# Patient Record
Sex: Female | Born: 1935
Health system: Southern US, Community
[De-identification: ages and names within clinical notes are randomized; demographics above are authoritative.]

## PROBLEM LIST (undated history)

## (undated) DIAGNOSIS — M549 Dorsalgia, unspecified: Secondary | ICD-10-CM

## (undated) DIAGNOSIS — K259 Gastric ulcer, unspecified as acute or chronic, without hemorrhage or perforation: Secondary | ICD-10-CM

## (undated) DIAGNOSIS — M1990 Unspecified osteoarthritis, unspecified site: Secondary | ICD-10-CM

## (undated) DIAGNOSIS — I639 Cerebral infarction, unspecified: Secondary | ICD-10-CM

## (undated) DIAGNOSIS — I10 Essential (primary) hypertension: Secondary | ICD-10-CM

## (undated) DIAGNOSIS — K317 Polyp of stomach and duodenum: Secondary | ICD-10-CM

## (undated) DIAGNOSIS — K219 Gastro-esophageal reflux disease without esophagitis: Secondary | ICD-10-CM

## (undated) HISTORY — DX: Unspecified osteoarthritis, unspecified site: M19.90

## (undated) HISTORY — DX: Gastro-esophageal reflux disease without esophagitis: K21.9

## (undated) HISTORY — PX: COLONOSCOPY: SHX174

## (undated) HISTORY — DX: Gastric ulcer, unspecified as acute or chronic, without hemorrhage or perforation: K25.9

## (undated) HISTORY — DX: Polyp of stomach and duodenum: K31.7

## (undated) HISTORY — DX: Essential (primary) hypertension: I10

---

## 2004-03-25 LAB — HM DEXA SCAN: HM DEXA SCAN: NORMAL

## 2005-03-29 ENCOUNTER — Ambulatory Visit: Payer: Self-pay | Admitting: Family Medicine

## 2005-09-11 HISTORY — PX: CARDIAC CATHETERIZATION: SHX172

## 2005-09-11 HISTORY — PX: CARPAL TUNNEL RELEASE: SHX101

## 2006-01-03 ENCOUNTER — Ambulatory Visit: Payer: Self-pay | Admitting: Family Medicine

## 2006-02-21 ENCOUNTER — Ambulatory Visit: Payer: Self-pay | Admitting: Specialist

## 2006-02-21 ENCOUNTER — Other Ambulatory Visit: Payer: Self-pay

## 2006-02-28 ENCOUNTER — Ambulatory Visit: Payer: Self-pay | Admitting: Specialist

## 2006-03-26 ENCOUNTER — Ambulatory Visit: Payer: Self-pay | Admitting: Family Medicine

## 2006-04-24 ENCOUNTER — Ambulatory Visit: Payer: Self-pay | Admitting: Family Medicine

## 2006-05-02 ENCOUNTER — Ambulatory Visit: Payer: Self-pay | Admitting: Family Medicine

## 2007-06-13 ENCOUNTER — Ambulatory Visit: Payer: Self-pay | Admitting: Family Medicine

## 2007-10-31 ENCOUNTER — Encounter: Payer: Self-pay | Admitting: Rheumatology

## 2007-11-10 ENCOUNTER — Encounter: Payer: Self-pay | Admitting: Rheumatology

## 2007-12-11 ENCOUNTER — Encounter: Payer: Self-pay | Admitting: Rheumatology

## 2008-01-13 ENCOUNTER — Ambulatory Visit: Payer: Self-pay | Admitting: Rheumatology

## 2008-01-22 ENCOUNTER — Inpatient Hospital Stay: Payer: Self-pay | Admitting: Internal Medicine

## 2008-01-29 ENCOUNTER — Other Ambulatory Visit: Payer: Self-pay

## 2008-01-29 ENCOUNTER — Emergency Department: Payer: Self-pay | Admitting: Emergency Medicine

## 2008-03-23 ENCOUNTER — Ambulatory Visit: Payer: Self-pay | Admitting: Pain Medicine

## 2008-04-07 ENCOUNTER — Ambulatory Visit: Payer: Self-pay | Admitting: Pain Medicine

## 2008-04-21 ENCOUNTER — Ambulatory Visit: Payer: Self-pay | Admitting: Physician Assistant

## 2008-08-20 ENCOUNTER — Ambulatory Visit: Payer: Self-pay | Admitting: Family Medicine

## 2008-09-11 HISTORY — PX: VASCULAR SURGERY: SHX849

## 2008-11-03 ENCOUNTER — Ambulatory Visit: Payer: Self-pay | Admitting: Vascular Surgery

## 2009-09-08 ENCOUNTER — Ambulatory Visit: Payer: Self-pay | Admitting: Family Medicine

## 2009-09-11 HISTORY — PX: LAMINECTOMY: SHX219

## 2009-09-11 HISTORY — PX: UPPER GI ENDOSCOPY: SHX6162

## 2009-09-11 HISTORY — PX: BACK SURGERY: SHX140

## 2010-02-06 ENCOUNTER — Emergency Department: Payer: Self-pay | Admitting: Emergency Medicine

## 2010-02-11 ENCOUNTER — Ambulatory Visit: Payer: Self-pay | Admitting: Orthopedic Surgery

## 2010-02-12 ENCOUNTER — Emergency Department: Payer: Self-pay | Admitting: Emergency Medicine

## 2010-04-27 ENCOUNTER — Ambulatory Visit: Payer: Self-pay | Admitting: Gastroenterology

## 2010-04-28 LAB — PATHOLOGY REPORT

## 2010-08-11 ENCOUNTER — Ambulatory Visit: Payer: Self-pay | Admitting: Gastroenterology

## 2010-08-25 ENCOUNTER — Encounter: Payer: Self-pay | Admitting: Neurosurgery

## 2010-09-11 ENCOUNTER — Encounter: Payer: Self-pay | Admitting: Neurosurgery

## 2010-10-12 ENCOUNTER — Encounter: Payer: Self-pay | Admitting: Neurosurgery

## 2010-11-22 ENCOUNTER — Ambulatory Visit: Payer: Self-pay | Admitting: Family Medicine

## 2011-06-23 ENCOUNTER — Telehealth: Payer: Self-pay

## 2011-06-23 DIAGNOSIS — K862 Cyst of pancreas: Secondary | ICD-10-CM

## 2011-06-23 NOTE — Telephone Encounter (Signed)
Pt has been instructed and meds reviewed.  Instructions have also been mailed

## 2011-06-23 NOTE — Telephone Encounter (Signed)
Need to instruct and review meds for EUS

## 2011-07-13 ENCOUNTER — Ambulatory Visit: Payer: Self-pay

## 2011-07-13 ENCOUNTER — Encounter: Payer: Self-pay | Admitting: Gastroenterology

## 2011-07-18 ENCOUNTER — Encounter: Payer: Self-pay | Admitting: Gastroenterology

## 2011-09-12 HISTORY — PX: OTHER SURGICAL HISTORY: SHX169

## 2011-09-14 DIAGNOSIS — H2510 Age-related nuclear cataract, unspecified eye: Secondary | ICD-10-CM | POA: Diagnosis not present

## 2011-10-09 DIAGNOSIS — H2510 Age-related nuclear cataract, unspecified eye: Secondary | ICD-10-CM | POA: Diagnosis not present

## 2011-10-17 ENCOUNTER — Ambulatory Visit: Payer: Self-pay | Admitting: Ophthalmology

## 2011-10-17 DIAGNOSIS — Z01812 Encounter for preprocedural laboratory examination: Secondary | ICD-10-CM | POA: Diagnosis not present

## 2011-10-17 DIAGNOSIS — I119 Hypertensive heart disease without heart failure: Secondary | ICD-10-CM | POA: Diagnosis not present

## 2011-10-17 DIAGNOSIS — Z01810 Encounter for preprocedural cardiovascular examination: Secondary | ICD-10-CM | POA: Diagnosis not present

## 2011-10-17 DIAGNOSIS — H2510 Age-related nuclear cataract, unspecified eye: Secondary | ICD-10-CM | POA: Diagnosis not present

## 2011-10-17 LAB — POTASSIUM: Potassium: 3.6 mmol/L (ref 3.5–5.1)

## 2011-10-25 ENCOUNTER — Ambulatory Visit: Payer: Self-pay | Admitting: Ophthalmology

## 2011-10-25 DIAGNOSIS — H2510 Age-related nuclear cataract, unspecified eye: Secondary | ICD-10-CM | POA: Diagnosis not present

## 2011-10-25 DIAGNOSIS — Z79899 Other long term (current) drug therapy: Secondary | ICD-10-CM | POA: Diagnosis not present

## 2011-10-25 DIAGNOSIS — H269 Unspecified cataract: Secondary | ICD-10-CM | POA: Diagnosis not present

## 2011-10-25 DIAGNOSIS — M810 Age-related osteoporosis without current pathological fracture: Secondary | ICD-10-CM | POA: Diagnosis not present

## 2011-10-25 DIAGNOSIS — M4800 Spinal stenosis, site unspecified: Secondary | ICD-10-CM | POA: Diagnosis not present

## 2011-10-25 DIAGNOSIS — R609 Edema, unspecified: Secondary | ICD-10-CM | POA: Diagnosis not present

## 2011-10-25 DIAGNOSIS — I10 Essential (primary) hypertension: Secondary | ICD-10-CM | POA: Diagnosis not present

## 2011-11-14 ENCOUNTER — Ambulatory Visit: Payer: Self-pay | Admitting: Ophthalmology

## 2011-11-14 DIAGNOSIS — Z9889 Other specified postprocedural states: Secondary | ICD-10-CM | POA: Diagnosis not present

## 2011-11-14 DIAGNOSIS — Z01812 Encounter for preprocedural laboratory examination: Secondary | ICD-10-CM | POA: Diagnosis not present

## 2011-11-14 DIAGNOSIS — H2510 Age-related nuclear cataract, unspecified eye: Secondary | ICD-10-CM | POA: Diagnosis not present

## 2011-11-14 DIAGNOSIS — I252 Old myocardial infarction: Secondary | ICD-10-CM | POA: Diagnosis not present

## 2011-11-14 DIAGNOSIS — I10 Essential (primary) hypertension: Secondary | ICD-10-CM | POA: Diagnosis not present

## 2011-11-14 LAB — POTASSIUM: Potassium: 3.7 mmol/L (ref 3.5–5.1)

## 2011-11-22 ENCOUNTER — Ambulatory Visit: Payer: Self-pay | Admitting: Ophthalmology

## 2011-11-22 DIAGNOSIS — I252 Old myocardial infarction: Secondary | ICD-10-CM | POA: Diagnosis not present

## 2011-11-22 DIAGNOSIS — Z7982 Long term (current) use of aspirin: Secondary | ICD-10-CM | POA: Diagnosis not present

## 2011-11-22 DIAGNOSIS — Z79899 Other long term (current) drug therapy: Secondary | ICD-10-CM | POA: Diagnosis not present

## 2011-11-22 DIAGNOSIS — Z9109 Other allergy status, other than to drugs and biological substances: Secondary | ICD-10-CM | POA: Diagnosis not present

## 2011-11-22 DIAGNOSIS — M129 Arthropathy, unspecified: Secondary | ICD-10-CM | POA: Diagnosis not present

## 2011-11-22 DIAGNOSIS — I10 Essential (primary) hypertension: Secondary | ICD-10-CM | POA: Diagnosis not present

## 2011-11-22 DIAGNOSIS — M4800 Spinal stenosis, site unspecified: Secondary | ICD-10-CM | POA: Diagnosis not present

## 2011-11-22 DIAGNOSIS — R002 Palpitations: Secondary | ICD-10-CM | POA: Diagnosis not present

## 2011-11-22 DIAGNOSIS — K219 Gastro-esophageal reflux disease without esophagitis: Secondary | ICD-10-CM | POA: Diagnosis not present

## 2011-11-22 DIAGNOSIS — M810 Age-related osteoporosis without current pathological fracture: Secondary | ICD-10-CM | POA: Diagnosis not present

## 2011-11-22 DIAGNOSIS — H269 Unspecified cataract: Secondary | ICD-10-CM | POA: Diagnosis not present

## 2011-11-22 DIAGNOSIS — R609 Edema, unspecified: Secondary | ICD-10-CM | POA: Diagnosis not present

## 2011-11-22 DIAGNOSIS — H2510 Age-related nuclear cataract, unspecified eye: Secondary | ICD-10-CM | POA: Diagnosis not present

## 2012-01-16 DIAGNOSIS — I10 Essential (primary) hypertension: Secondary | ICD-10-CM | POA: Diagnosis not present

## 2012-01-16 DIAGNOSIS — Z79899 Other long term (current) drug therapy: Secondary | ICD-10-CM | POA: Diagnosis not present

## 2012-01-16 DIAGNOSIS — R5383 Other fatigue: Secondary | ICD-10-CM | POA: Diagnosis not present

## 2012-01-16 DIAGNOSIS — M549 Dorsalgia, unspecified: Secondary | ICD-10-CM | POA: Diagnosis not present

## 2012-01-24 ENCOUNTER — Ambulatory Visit: Payer: Self-pay | Admitting: Family Medicine

## 2012-01-24 DIAGNOSIS — Z1231 Encounter for screening mammogram for malignant neoplasm of breast: Secondary | ICD-10-CM | POA: Diagnosis not present

## 2012-05-02 DIAGNOSIS — H903 Sensorineural hearing loss, bilateral: Secondary | ICD-10-CM | POA: Diagnosis not present

## 2012-05-02 DIAGNOSIS — H6120 Impacted cerumen, unspecified ear: Secondary | ICD-10-CM | POA: Diagnosis not present

## 2012-07-01 DIAGNOSIS — Z961 Presence of intraocular lens: Secondary | ICD-10-CM | POA: Diagnosis not present

## 2012-07-01 DIAGNOSIS — H2510 Age-related nuclear cataract, unspecified eye: Secondary | ICD-10-CM | POA: Diagnosis not present

## 2012-07-17 DIAGNOSIS — Z0000 Encounter for general adult medical examination without abnormal findings: Secondary | ICD-10-CM | POA: Diagnosis not present

## 2012-07-17 DIAGNOSIS — R5381 Other malaise: Secondary | ICD-10-CM | POA: Diagnosis not present

## 2012-07-17 DIAGNOSIS — M549 Dorsalgia, unspecified: Secondary | ICD-10-CM | POA: Diagnosis not present

## 2012-08-14 DIAGNOSIS — I10 Essential (primary) hypertension: Secondary | ICD-10-CM | POA: Diagnosis not present

## 2012-08-14 DIAGNOSIS — J309 Allergic rhinitis, unspecified: Secondary | ICD-10-CM | POA: Diagnosis not present

## 2012-08-14 DIAGNOSIS — Z23 Encounter for immunization: Secondary | ICD-10-CM | POA: Diagnosis not present

## 2012-08-14 DIAGNOSIS — R109 Unspecified abdominal pain: Secondary | ICD-10-CM | POA: Diagnosis not present

## 2012-09-11 DIAGNOSIS — K317 Polyp of stomach and duodenum: Secondary | ICD-10-CM

## 2012-09-11 HISTORY — DX: Polyp of stomach and duodenum: K31.7

## 2012-09-19 DIAGNOSIS — I10 Essential (primary) hypertension: Secondary | ICD-10-CM | POA: Diagnosis not present

## 2012-09-19 DIAGNOSIS — R109 Unspecified abdominal pain: Secondary | ICD-10-CM | POA: Diagnosis not present

## 2012-09-25 ENCOUNTER — Ambulatory Visit: Payer: Self-pay | Admitting: Family Medicine

## 2012-09-25 DIAGNOSIS — R109 Unspecified abdominal pain: Secondary | ICD-10-CM | POA: Diagnosis not present

## 2012-09-25 DIAGNOSIS — N269 Renal sclerosis, unspecified: Secondary | ICD-10-CM | POA: Diagnosis not present

## 2012-10-10 DIAGNOSIS — E669 Obesity, unspecified: Secondary | ICD-10-CM | POA: Diagnosis not present

## 2012-10-10 DIAGNOSIS — I10 Essential (primary) hypertension: Secondary | ICD-10-CM | POA: Diagnosis not present

## 2012-10-10 DIAGNOSIS — R109 Unspecified abdominal pain: Secondary | ICD-10-CM | POA: Diagnosis not present

## 2012-10-21 ENCOUNTER — Ambulatory Visit: Payer: Self-pay | Admitting: Family Medicine

## 2012-10-21 DIAGNOSIS — K828 Other specified diseases of gallbladder: Secondary | ICD-10-CM | POA: Diagnosis not present

## 2012-10-21 DIAGNOSIS — R948 Abnormal results of function studies of other organs and systems: Secondary | ICD-10-CM | POA: Diagnosis not present

## 2012-10-21 DIAGNOSIS — R109 Unspecified abdominal pain: Secondary | ICD-10-CM | POA: Diagnosis not present

## 2012-11-19 ENCOUNTER — Encounter: Payer: Self-pay | Admitting: *Deleted

## 2012-11-21 ENCOUNTER — Ambulatory Visit: Payer: Self-pay | Admitting: General Surgery

## 2012-11-21 DIAGNOSIS — R1013 Epigastric pain: Secondary | ICD-10-CM | POA: Diagnosis not present

## 2012-11-26 ENCOUNTER — Encounter: Payer: Self-pay | Admitting: *Deleted

## 2012-11-27 ENCOUNTER — Encounter: Payer: Self-pay | Admitting: *Deleted

## 2012-11-27 ENCOUNTER — Encounter: Payer: Self-pay | Admitting: General Surgery

## 2012-11-27 ENCOUNTER — Ambulatory Visit (INDEPENDENT_AMBULATORY_CARE_PROVIDER_SITE_OTHER): Payer: Medicare Other | Admitting: General Surgery

## 2012-11-27 VITALS — BP 132/64 | HR 76 | Resp 14 | Ht 63.5 in | Wt 193.0 lb

## 2012-11-27 DIAGNOSIS — R1013 Epigastric pain: Secondary | ICD-10-CM | POA: Diagnosis not present

## 2012-11-27 NOTE — Progress Notes (Signed)
Subjective:     Patient ID: Dawn [name], female   DOB: 08/05/1934, 79 y.o.   MRN: [id number]  HPI Patient presents for a follow up of her HIDA Scan results. Patient was previously seen for abdominal pain that started in November 2013. The patient states she experienced pain every time she would eat. She continues to have the same pain with minimal improvement with use of Zantac and Prevacid.    Review of Systems  Constitutional: Negative.   Respiratory: Negative.   Cardiovascular: Negative.   Gastrointestinal: Negative.        Objective:   Physical Exam  Eyes: Conjunctivae are normal. No scleral icterus.  Cardiovascular: Normal rate, regular rhythm and normal heart sounds.   Pulmonary/Chest: Effort normal and breath sounds normal.  Abdominal: Soft. Normal appearance and bowel sounds are normal.       Assessment:    Pt has had normal [place] of gb. She ha low EF on last HIDA but repeat HIDA is normal.  Plan for Upper endoscopy      Plan:        Schedule upper endoscopy. If normal, will get CT abdomen. Pt advised

## 2012-11-27 NOTE — Patient Instructions (Signed)
Esophagogastroduodenoscopy This is an endoscopic procedure (a procedure that uses a device like a flexible telescope) that allows your caregiver to view the upper stomach and small bowel. This test allows your caregiver to look at the esophagus. The esophagus carries food from your mouth to your stomach. They can also look at your duodenum. This is the first part of the small intestine that attaches to the stomach. This test is used to detect problems in the bowel such as ulcers and inflammation. PREPARATION FOR TEST Nothing to eat after midnight the day before the test. NORMAL FINDINGS Normal esophagus, stomach, and duodenum. Ranges for normal findings may vary among different laboratories and hospitals. You should always check with your doctor after having lab work or other tests done to discuss the meaning of your test results and whether your values are considered within normal limits. MEANING OF TEST  Your caregiver will go over the test results with you and discuss the importance and meaning of your results, as well as treatment options and the need for additional tests if necessary. OBTAINING THE TEST RESULTS It is your responsibility to obtain your test results. Ask the lab or department performing the test when and how you will get your results. Document Released: 12/29/2004 Document Revised: 11/20/2011 Document Reviewed: 08/07/2008 [facility] Patient Information 2013 [place], [place].

## 2012-11-27 NOTE — Progress Notes (Signed)
Patient ID: Dawn [name], female   DOB: [birth year], 79 y.o.   MRN: [id number]  Patient has been scheduled for an upper endoscopy at [facility] for 12-04-12. This patient is aware of date, time, and instructions. It is okay for patient to continue 81 mg aspirin.

## 2012-11-29 ENCOUNTER — Other Ambulatory Visit: Payer: Self-pay | Admitting: General Surgery

## 2012-11-29 DIAGNOSIS — R1013 Epigastric pain: Secondary | ICD-10-CM

## 2012-12-04 ENCOUNTER — Ambulatory Visit: Payer: Self-pay | Admitting: General Surgery

## 2012-12-04 DIAGNOSIS — I10 Essential (primary) hypertension: Secondary | ICD-10-CM | POA: Diagnosis not present

## 2012-12-04 DIAGNOSIS — D131 Benign neoplasm of stomach: Secondary | ICD-10-CM | POA: Diagnosis not present

## 2012-12-04 DIAGNOSIS — K2970 Gastritis, unspecified, without bleeding: Secondary | ICD-10-CM | POA: Diagnosis not present

## 2012-12-04 DIAGNOSIS — Z7982 Long term (current) use of aspirin: Secondary | ICD-10-CM | POA: Diagnosis not present

## 2012-12-04 DIAGNOSIS — R1013 Epigastric pain: Secondary | ICD-10-CM | POA: Diagnosis not present

## 2012-12-04 DIAGNOSIS — K2960 Other gastritis without bleeding: Secondary | ICD-10-CM

## 2012-12-04 DIAGNOSIS — Z888 Allergy status to other drugs, medicaments and biological substances status: Secondary | ICD-10-CM | POA: Diagnosis not present

## 2012-12-04 DIAGNOSIS — Z79899 Other long term (current) drug therapy: Secondary | ICD-10-CM | POA: Diagnosis not present

## 2012-12-04 DIAGNOSIS — K319 Disease of stomach and duodenum, unspecified: Secondary | ICD-10-CM | POA: Diagnosis not present

## 2012-12-09 ENCOUNTER — Encounter: Payer: Self-pay | Admitting: General Surgery

## 2012-12-11 ENCOUNTER — Encounter: Payer: Self-pay | Admitting: General Surgery

## 2012-12-11 ENCOUNTER — Telehealth: Payer: Self-pay | Admitting: *Deleted

## 2012-12-11 NOTE — Telephone Encounter (Signed)
Patient was contacted today at Dr. Luan Moore request. She is aware that biopsies were nothing bad. She will follow up in the office on 12-23-12.

## 2012-12-23 ENCOUNTER — Encounter: Payer: Self-pay | Admitting: General Surgery

## 2012-12-23 ENCOUNTER — Ambulatory Visit (INDEPENDENT_AMBULATORY_CARE_PROVIDER_SITE_OTHER): Payer: Medicare Other | Admitting: General Surgery

## 2012-12-23 VITALS — BP 124/70 | HR 72 | Resp 14 | Ht 62.0 in | Wt 193.0 lb

## 2012-12-23 DIAGNOSIS — R1013 Epigastric pain: Secondary | ICD-10-CM

## 2012-12-23 DIAGNOSIS — D131 Benign neoplasm of stomach: Secondary | ICD-10-CM

## 2012-12-23 NOTE — Progress Notes (Signed)
Patient ID: Dawn [name], female   DOB: 17-Jun-1934, 79 y.o.   MRN: [id number]  Chief Complaint  Patient presents with  . Follow-up    HPI Dawn [name] is a 79 y.o. female here today following up from a upper endo. Pt is currently on Prevacid and tagamet.  HPI  Past Medical History  Diagnosis Date  . Arthritis   . Hypertension   . Stomach ulcer   . Acid reflux     Past Surgical History  Procedure Laterality Date  . Upper gi endoscopy  2011  . Vascular surgery Right 2010    right rental artery   . Cardiac catheterization  2007    normal, [employer] [facility] [place]   . Colonoscopy  2011    Dr. Maryruth Bun  . Cataract surgery   2013  . Laminectomy  2011    decompression   . Back surgery  2011    vertebroplasty   . Carpal tunnel release  2007    Family History  Problem Relation Age of Onset  . Cancer Father     stomach  . Breast cancer Paternal Aunt   . Breast cancer Paternal Grandmother   . Breast cancer Paternal Aunt   . Breast cancer Paternal Aunt   . Breast cancer Paternal Aunt     Social History History  Substance Use Topics  . Smoking status: Never Smoker   . Smokeless tobacco: Never Used  . Alcohol Use: No    Allergies  Allergen Reactions  . Quinine Derivatives Swelling    Current Outpatient Prescriptions  Medication Sig Dispense Refill  . aspirin 81 MG tablet Take 81 mg by mouth daily.      . Biotin 2500 MCG CAPS Take 5,000 mcg by mouth daily.       . Calcium Carbonate-Vit D-Min (CALCIUM 600 + MINERALS) 600-200 MG-UNIT TABS Take by mouth.      . fexofenadine (ALLEGRA) 180 MG tablet Take 180 mg by mouth daily.      [name] labetalol (NORMODYNE) 300 MG tablet Take 200 mg by mouth 2 (two) times daily.      . lansoprazole (PREVACID) 15 MG capsule Take 15 mg by mouth daily.      . Olmesartan-Amlodipine-HCTZ (TRIBENZOR) 40-10-25 MG TABS Take by mouth.      . ranitidine (ZANTAC) 150 MG tablet Take 1 tablet by mouth 2 (two) times daily.        No  current facility-administered medications for this visit.    Review of Systems Review of Systems  Constitutional: Negative.   Respiratory: Negative.   Cardiovascular: Negative.   Gastrointestinal: Negative.     Blood pressure 124/70, pulse 72, resp. rate 14, height 5\' 2"  (1.575 m), weight 193 lb (87.544 kg).  Physical Exam Physical Examnot done  Data Reviewed Polyps removed from stomach showed FGP-related to PPI use. Area of gastritis, no H pylori.  Assessment    Symptomatically pt is a lot better now. Recommended stopping Prevacid and continnuing Tagamet. Avoid eating for  at least 2 hrs prior to going to bed, elevate HOB.      Plan    As above        SANKAR,SEEPLAPUTHUR G 12/24/2012, 12:19 PM

## 2012-12-24 ENCOUNTER — Encounter: Payer: Self-pay | Admitting: General Surgery

## 2012-12-24 DIAGNOSIS — D131 Benign neoplasm of stomach: Secondary | ICD-10-CM | POA: Insufficient documentation

## 2012-12-24 NOTE — Patient Instructions (Signed)
Stop Prevacid. Continue Tagamet. Recheck in 3 mos.

## 2013-01-09 DIAGNOSIS — R5383 Other fatigue: Secondary | ICD-10-CM | POA: Diagnosis not present

## 2013-01-09 DIAGNOSIS — R109 Unspecified abdominal pain: Secondary | ICD-10-CM | POA: Diagnosis not present

## 2013-01-09 DIAGNOSIS — R5381 Other malaise: Secondary | ICD-10-CM | POA: Diagnosis not present

## 2013-01-09 DIAGNOSIS — I10 Essential (primary) hypertension: Secondary | ICD-10-CM | POA: Diagnosis not present

## 2013-03-31 ENCOUNTER — Encounter: Payer: Self-pay | Admitting: General Surgery

## 2013-03-31 ENCOUNTER — Ambulatory Visit (INDEPENDENT_AMBULATORY_CARE_PROVIDER_SITE_OTHER): Payer: Medicare Other | Admitting: General Surgery

## 2013-03-31 VITALS — BP 130/60 | HR 61 | Resp 14 | Ht 62.0 in | Wt 195.0 lb

## 2013-03-31 DIAGNOSIS — K219 Gastro-esophageal reflux disease without esophagitis: Secondary | ICD-10-CM

## 2013-03-31 DIAGNOSIS — K317 Polyp of stomach and duodenum: Secondary | ICD-10-CM | POA: Insufficient documentation

## 2013-03-31 NOTE — Patient Instructions (Addendum)
Patient advised she can go back on Prevacid 30 mg over the counter.

## 2013-03-31 NOTE — Progress Notes (Signed)
Patient ID: Dawn [name], female   DOB: 06-Aug-1934, 79 y.o.   MRN: [id number]  Chief Complaint  Patient presents with  . Follow-up    69month follow up for gastric polyp    HPI Dawn [name] is a 79 y.o. female who presents for a 3 month follow up of gastric polyps. The patient states she is overall well. She does have some reflux symptoms in the later part of the day. Endoscopy showed 2 FGP because of this she was switched from Prevacid to Zantac. Patient said she did better when she was on Prevacid. Has not had reoccurrence of abdominal pain or vomiting.    HPI  Past Medical History  Diagnosis Date  . Arthritis   . Hypertension   . Stomach ulcer   . Acid reflux   . Gastric polyp 2014    Past Surgical History  Procedure Laterality Date  . Upper gi endoscopy  2011  . Vascular surgery Right 2010    right rental artery   . Cardiac catheterization  2007    normal, [employer] [facility] [place]   . Colonoscopy  2011    Dr. Maryruth Bun  . Cataract surgery   2013  . Laminectomy  2011    decompression   . Back surgery  2011    vertebroplasty   . Carpal tunnel release  2007    Family History  Problem Relation Age of Onset  . Cancer Father     stomach  . Breast cancer Paternal Aunt   . Breast cancer Paternal Grandmother   . Breast cancer Paternal Aunt   . Breast cancer Paternal Aunt   . Breast cancer Paternal Aunt     Social History History  Substance Use Topics  . Smoking status: Never Smoker   . Smokeless tobacco: Never Used  . Alcohol Use: No    Allergies  Allergen Reactions  . Quinine Derivatives Swelling    Current Outpatient Prescriptions  Medication Sig Dispense Refill  . aspirin 81 MG tablet Take 81 mg by mouth daily.      . Biotin 2500 MCG CAPS Take 5,000 mcg by mouth daily.       . Calcium Carbonate-Vit D-Min (CALCIUM 600 + MINERALS) 600-200 MG-UNIT TABS Take by mouth.      . fexofenadine (ALLEGRA) 180 MG tablet Take 180 mg by mouth daily.      [name]  labetalol (NORMODYNE) 300 MG tablet Take 200 mg by mouth 2 (two) times daily.      . Olmesartan-Amlodipine-HCTZ (TRIBENZOR) 40-10-25 MG TABS Take by mouth.      . ranitidine (ZANTAC) 150 MG tablet Take 1 tablet by mouth 2 (two) times daily.        No current facility-administered medications for this visit.    Review of Systems Review of Systems  Constitutional: Negative.   Respiratory: Negative.   Cardiovascular: Negative.   Gastrointestinal: Negative.     Blood pressure 130/60, pulse 61, resp. rate 14, height 5\' 2"  (1.575 m), weight 195 lb (88.451 kg).  Physical Exam Physical Exam  Constitutional: She is oriented to person, place, and time. She appears well-developed and well-nourished.  Eyes: Conjunctivae are normal. No scleral icterus.  Neck: Neck supple. No tracheal deviation present. No thyromegaly present.  Lymphadenopathy:    She has no cervical adenopathy.  Neurological: She is alert and oriented to person, place, and time.  Skin: Skin is warm and dry.    Data Reviewed None  Assessment  GERD     Plan    Patient may resume Prevacid 30 mg.        SANKAR,SEEPLAPUTHUR G 04/01/2013, 8:15 PM

## 2013-04-01 ENCOUNTER — Encounter: Payer: Self-pay | Admitting: General Surgery

## 2013-04-16 ENCOUNTER — Other Ambulatory Visit: Payer: Self-pay

## 2013-06-05 DIAGNOSIS — R05 Cough: Secondary | ICD-10-CM | POA: Diagnosis not present

## 2013-06-05 DIAGNOSIS — I10 Essential (primary) hypertension: Secondary | ICD-10-CM | POA: Diagnosis not present

## 2013-06-05 DIAGNOSIS — J209 Acute bronchitis, unspecified: Secondary | ICD-10-CM | POA: Diagnosis not present

## 2013-06-06 DIAGNOSIS — J329 Chronic sinusitis, unspecified: Secondary | ICD-10-CM | POA: Diagnosis not present

## 2013-06-06 DIAGNOSIS — Z823 Family history of stroke: Secondary | ICD-10-CM | POA: Diagnosis not present

## 2013-06-06 DIAGNOSIS — R209 Unspecified disturbances of skin sensation: Secondary | ICD-10-CM | POA: Diagnosis not present

## 2013-06-06 DIAGNOSIS — Z7982 Long term (current) use of aspirin: Secondary | ICD-10-CM | POA: Diagnosis not present

## 2013-06-06 DIAGNOSIS — J40 Bronchitis, not specified as acute or chronic: Secondary | ICD-10-CM | POA: Diagnosis not present

## 2013-06-06 DIAGNOSIS — K219 Gastro-esophageal reflux disease without esophagitis: Secondary | ICD-10-CM | POA: Diagnosis not present

## 2013-06-06 DIAGNOSIS — Z888 Allergy status to other drugs, medicaments and biological substances status: Secondary | ICD-10-CM | POA: Diagnosis not present

## 2013-06-06 DIAGNOSIS — Z880 Allergy status to penicillin: Secondary | ICD-10-CM | POA: Diagnosis not present

## 2013-06-06 DIAGNOSIS — Z79899 Other long term (current) drug therapy: Secondary | ICD-10-CM | POA: Diagnosis not present

## 2013-06-06 DIAGNOSIS — I701 Atherosclerosis of renal artery: Secondary | ICD-10-CM | POA: Diagnosis not present

## 2013-06-06 DIAGNOSIS — E871 Hypo-osmolality and hyponatremia: Secondary | ICD-10-CM | POA: Diagnosis not present

## 2013-06-06 DIAGNOSIS — Z833 Family history of diabetes mellitus: Secondary | ICD-10-CM | POA: Diagnosis not present

## 2013-06-06 DIAGNOSIS — J0100 Acute maxillary sinusitis, unspecified: Secondary | ICD-10-CM | POA: Diagnosis not present

## 2013-06-06 DIAGNOSIS — Z881 Allergy status to other antibiotic agents status: Secondary | ICD-10-CM | POA: Diagnosis not present

## 2013-06-06 DIAGNOSIS — Z885 Allergy status to narcotic agent status: Secondary | ICD-10-CM | POA: Diagnosis not present

## 2013-06-06 DIAGNOSIS — R05 Cough: Secondary | ICD-10-CM | POA: Diagnosis not present

## 2013-06-06 DIAGNOSIS — I10 Essential (primary) hypertension: Secondary | ICD-10-CM | POA: Diagnosis not present

## 2013-06-06 DIAGNOSIS — N179 Acute kidney failure, unspecified: Secondary | ICD-10-CM | POA: Diagnosis not present

## 2013-06-06 DIAGNOSIS — G459 Transient cerebral ischemic attack, unspecified: Secondary | ICD-10-CM | POA: Diagnosis not present

## 2013-06-06 DIAGNOSIS — N289 Disorder of kidney and ureter, unspecified: Secondary | ICD-10-CM | POA: Diagnosis not present

## 2013-06-06 LAB — COMPREHENSIVE METABOLIC PANEL
Albumin: 3.7 g/dL (ref 3.4–5.0)
Alkaline Phosphatase: 105 U/L (ref 50–136)
Anion Gap: 4 — ABNORMAL LOW (ref 7–16)
BUN: 20 mg/dL — ABNORMAL HIGH (ref 7–18)
Co2: 30 mmol/L (ref 21–32)
EGFR (African American): 43 — ABNORMAL LOW
EGFR (Non-African Amer.): 37 — ABNORMAL LOW
Potassium: 3.4 mmol/L — ABNORMAL LOW (ref 3.5–5.1)
SGOT(AST): 28 U/L (ref 15–37)
Sodium: 131 mmol/L — ABNORMAL LOW (ref 136–145)

## 2013-06-06 LAB — CBC
HCT: 36.1 % (ref 35.0–47.0)
HGB: 12.8 g/dL (ref 12.0–16.0)
MCH: 31.6 pg (ref 26.0–34.0)
MCHC: 35.5 g/dL (ref 32.0–36.0)
Platelet: 292 10*3/uL (ref 150–440)
RBC: 4.05 10*6/uL (ref 3.80–5.20)
WBC: 7.5 10*3/uL (ref 3.6–11.0)

## 2013-06-06 LAB — CK TOTAL AND CKMB (NOT AT ARMC): CK, Total: 170 U/L (ref 21–215)

## 2013-06-06 LAB — TROPONIN I: Troponin-I: <0.02 ng/mL

## 2013-06-07 ENCOUNTER — Observation Stay: Payer: Self-pay | Admitting: Internal Medicine

## 2013-06-07 DIAGNOSIS — I10 Essential (primary) hypertension: Secondary | ICD-10-CM | POA: Diagnosis not present

## 2013-06-07 DIAGNOSIS — I6350 Cerebral infarction due to unspecified occlusion or stenosis of unspecified cerebral artery: Secondary | ICD-10-CM | POA: Diagnosis not present

## 2013-06-07 DIAGNOSIS — I6529 Occlusion and stenosis of unspecified carotid artery: Secondary | ICD-10-CM | POA: Diagnosis not present

## 2013-06-07 DIAGNOSIS — G459 Transient cerebral ischemic attack, unspecified: Secondary | ICD-10-CM | POA: Diagnosis not present

## 2013-06-07 DIAGNOSIS — N179 Acute kidney failure, unspecified: Secondary | ICD-10-CM | POA: Diagnosis not present

## 2013-06-07 DIAGNOSIS — E871 Hypo-osmolality and hyponatremia: Secondary | ICD-10-CM | POA: Diagnosis not present

## 2013-06-07 DIAGNOSIS — N289 Disorder of kidney and ureter, unspecified: Secondary | ICD-10-CM | POA: Diagnosis not present

## 2013-06-07 LAB — URINALYSIS, COMPLETE
Bacteria: NONE SEEN
Ketone: NEGATIVE
Leukocyte Esterase: NEGATIVE
Nitrite: NEGATIVE
Protein: NEGATIVE
RBC,UR: <1 /HPF (ref 0–5)
Squamous Epithelial: NONE SEEN
WBC UR: NONE SEEN /HPF (ref 0–5)

## 2013-06-07 LAB — BASIC METABOLIC PANEL
Anion Gap: 7 (ref 7–16)
BUN: 14 mg/dL (ref 7–18)
Calcium, Total: 9 mg/dL (ref 8.5–10.1)
Chloride: 96 mmol/L — ABNORMAL LOW (ref 98–107)
Co2: 27 mmol/L (ref 21–32)
EGFR (African American): 50 — ABNORMAL LOW
EGFR (Non-African Amer.): 43 — ABNORMAL LOW
Osmolality: 262 (ref 275–301)
Sodium: 130 mmol/L — ABNORMAL LOW (ref 136–145)

## 2013-06-08 DIAGNOSIS — I10 Essential (primary) hypertension: Secondary | ICD-10-CM | POA: Diagnosis not present

## 2013-06-08 DIAGNOSIS — N179 Acute kidney failure, unspecified: Secondary | ICD-10-CM | POA: Diagnosis not present

## 2013-06-08 DIAGNOSIS — G459 Transient cerebral ischemic attack, unspecified: Secondary | ICD-10-CM | POA: Diagnosis not present

## 2013-06-08 DIAGNOSIS — J329 Chronic sinusitis, unspecified: Secondary | ICD-10-CM | POA: Diagnosis not present

## 2013-06-08 LAB — LIPID PANEL: Triglycerides: 178 mg/dL (ref 0–200)

## 2013-06-09 DIAGNOSIS — E669 Obesity, unspecified: Secondary | ICD-10-CM | POA: Diagnosis not present

## 2013-06-09 DIAGNOSIS — R7309 Other abnormal glucose: Secondary | ICD-10-CM | POA: Diagnosis not present

## 2013-06-09 DIAGNOSIS — R209 Unspecified disturbances of skin sensation: Secondary | ICD-10-CM | POA: Diagnosis not present

## 2013-06-09 DIAGNOSIS — M6281 Muscle weakness (generalized): Secondary | ICD-10-CM | POA: Diagnosis not present

## 2013-06-09 DIAGNOSIS — G459 Transient cerebral ischemic attack, unspecified: Secondary | ICD-10-CM | POA: Diagnosis not present

## 2013-06-09 DIAGNOSIS — I10 Essential (primary) hypertension: Secondary | ICD-10-CM | POA: Diagnosis not present

## 2013-06-25 DIAGNOSIS — I6789 Other cerebrovascular disease: Secondary | ICD-10-CM | POA: Diagnosis not present

## 2013-06-25 DIAGNOSIS — M6281 Muscle weakness (generalized): Secondary | ICD-10-CM | POA: Diagnosis not present

## 2013-06-25 DIAGNOSIS — R7309 Other abnormal glucose: Secondary | ICD-10-CM | POA: Diagnosis not present

## 2013-06-25 DIAGNOSIS — R209 Unspecified disturbances of skin sensation: Secondary | ICD-10-CM | POA: Diagnosis not present

## 2013-07-01 ENCOUNTER — Ambulatory Visit: Payer: Self-pay | Admitting: Family Medicine

## 2013-07-01 DIAGNOSIS — Z1231 Encounter for screening mammogram for malignant neoplasm of breast: Secondary | ICD-10-CM | POA: Diagnosis not present

## 2013-07-17 ENCOUNTER — Other Ambulatory Visit: Payer: Self-pay

## 2013-08-01 DIAGNOSIS — I6330 Cerebral infarction due to thrombosis of unspecified cerebral artery: Secondary | ICD-10-CM | POA: Diagnosis not present

## 2013-08-04 DIAGNOSIS — Z961 Presence of intraocular lens: Secondary | ICD-10-CM | POA: Diagnosis not present

## 2013-08-04 DIAGNOSIS — H2510 Age-related nuclear cataract, unspecified eye: Secondary | ICD-10-CM | POA: Diagnosis not present

## 2013-08-27 DIAGNOSIS — IMO0001 Reserved for inherently not codable concepts without codable children: Secondary | ICD-10-CM | POA: Diagnosis not present

## 2013-08-27 DIAGNOSIS — Z79899 Other long term (current) drug therapy: Secondary | ICD-10-CM | POA: Diagnosis not present

## 2013-08-27 DIAGNOSIS — I10 Essential (primary) hypertension: Secondary | ICD-10-CM | POA: Diagnosis not present

## 2013-08-27 DIAGNOSIS — E7800 Pure hypercholesterolemia, unspecified: Secondary | ICD-10-CM | POA: Diagnosis not present

## 2013-08-28 DIAGNOSIS — E7800 Pure hypercholesterolemia, unspecified: Secondary | ICD-10-CM | POA: Diagnosis not present

## 2013-08-28 LAB — LIPID PANEL
Cholesterol: 152 mg/dL (ref 0–200)
HDL: 68 mg/dL (ref 35–70)
LDL Cholesterol: 62 mg/dL
LDl/HDL Ratio: 0.9
Triglycerides: 110 mg/dL (ref 40–160)

## 2013-09-11 HISTORY — PX: COLON SURGERY: SHX602

## 2013-09-17 DIAGNOSIS — R209 Unspecified disturbances of skin sensation: Secondary | ICD-10-CM | POA: Diagnosis not present

## 2013-09-18 DIAGNOSIS — Z79899 Other long term (current) drug therapy: Secondary | ICD-10-CM | POA: Diagnosis not present

## 2013-09-18 DIAGNOSIS — E7800 Pure hypercholesterolemia, unspecified: Secondary | ICD-10-CM | POA: Diagnosis not present

## 2013-09-18 DIAGNOSIS — G589 Mononeuropathy, unspecified: Secondary | ICD-10-CM | POA: Diagnosis not present

## 2013-09-30 DIAGNOSIS — G589 Mononeuropathy, unspecified: Secondary | ICD-10-CM | POA: Diagnosis not present

## 2013-09-30 DIAGNOSIS — E7800 Pure hypercholesterolemia, unspecified: Secondary | ICD-10-CM | POA: Diagnosis not present

## 2013-09-30 DIAGNOSIS — R1032 Left lower quadrant pain: Secondary | ICD-10-CM | POA: Diagnosis not present

## 2013-09-30 DIAGNOSIS — Z79899 Other long term (current) drug therapy: Secondary | ICD-10-CM | POA: Diagnosis not present

## 2013-10-02 ENCOUNTER — Ambulatory Visit: Payer: Self-pay | Admitting: Family Medicine

## 2013-10-02 DIAGNOSIS — R109 Unspecified abdominal pain: Secondary | ICD-10-CM | POA: Diagnosis not present

## 2013-10-08 DIAGNOSIS — Z79899 Other long term (current) drug therapy: Secondary | ICD-10-CM | POA: Diagnosis not present

## 2013-10-08 DIAGNOSIS — E7800 Pure hypercholesterolemia, unspecified: Secondary | ICD-10-CM | POA: Diagnosis not present

## 2013-10-08 DIAGNOSIS — R109 Unspecified abdominal pain: Secondary | ICD-10-CM | POA: Diagnosis not present

## 2013-10-08 DIAGNOSIS — N390 Urinary tract infection, site not specified: Secondary | ICD-10-CM | POA: Diagnosis not present

## 2013-10-09 ENCOUNTER — Ambulatory Visit: Payer: Self-pay | Admitting: Family Medicine

## 2013-10-09 DIAGNOSIS — N289 Disorder of kidney and ureter, unspecified: Secondary | ICD-10-CM | POA: Diagnosis not present

## 2013-10-09 DIAGNOSIS — N269 Renal sclerosis, unspecified: Secondary | ICD-10-CM | POA: Diagnosis not present

## 2013-10-09 DIAGNOSIS — N949 Unspecified condition associated with female genital organs and menstrual cycle: Secondary | ICD-10-CM | POA: Diagnosis not present

## 2013-10-09 DIAGNOSIS — R109 Unspecified abdominal pain: Secondary | ICD-10-CM | POA: Diagnosis not present

## 2013-10-09 DIAGNOSIS — K828 Other specified diseases of gallbladder: Secondary | ICD-10-CM | POA: Diagnosis not present

## 2013-10-13 ENCOUNTER — Ambulatory Visit: Payer: Self-pay | Admitting: Family Medicine

## 2013-10-13 DIAGNOSIS — K630 Abscess of intestine: Secondary | ICD-10-CM | POA: Diagnosis not present

## 2013-10-13 LAB — COMPREHENSIVE METABOLIC PANEL
Albumin: 3.3 g/dL — ABNORMAL LOW (ref 3.4–5.0)
Alkaline Phosphatase: 96 U/L
Anion Gap: 6 — ABNORMAL LOW (ref 7–16)
BUN: 19 mg/dL — ABNORMAL HIGH (ref 7–18)
Bilirubin,Total: 0.5 mg/dL (ref 0.2–1.0)
CHLORIDE: 93 mmol/L — AB (ref 98–107)
CREATININE: 1.38 mg/dL — AB (ref 0.60–1.30)
Calcium, Total: 9 mg/dL (ref 8.5–10.1)
Co2: 26 mmol/L (ref 21–32)
EGFR (African American): 43 — ABNORMAL LOW
EGFR (Non-African Amer.): 37 — ABNORMAL LOW
Glucose: 170 mg/dL — ABNORMAL HIGH (ref 65–99)
Osmolality: 258 (ref 275–301)
POTASSIUM: 3.3 mmol/L — AB (ref 3.5–5.1)
SGOT(AST): 25 U/L (ref 15–37)
SGPT (ALT): 16 U/L (ref 12–78)
SODIUM: 125 mmol/L — AB (ref 136–145)
Total Protein: 7.1 g/dL (ref 6.4–8.2)

## 2013-10-13 LAB — URINALYSIS, COMPLETE
Bacteria: NONE SEEN
Bilirubin,UR: NEGATIVE
GLUCOSE, UR: NEGATIVE mg/dL (ref 0–75)
KETONE: NEGATIVE
LEUKOCYTE ESTERASE: NEGATIVE
Nitrite: NEGATIVE
PH: 5 (ref 4.5–8.0)
Protein: 100
RBC,UR: 54 /HPF (ref 0–5)
Specific Gravity: 1.03 (ref 1.003–1.030)
Squamous Epithelial: <1
WBC UR: 1 /HPF (ref 0–5)

## 2013-10-13 LAB — CBC WITH DIFFERENTIAL/PLATELET
BASOS ABS: 0.1 10*3/uL (ref 0.0–0.1)
BASOS PCT: 1 %
Eosinophil #: 0.2 10*3/uL (ref 0.0–0.7)
Eosinophil %: 1.7 %
HCT: 36.4 % (ref 35.0–47.0)
HGB: 12.4 g/dL (ref 12.0–16.0)
LYMPHS ABS: 1.3 10*3/uL (ref 1.0–3.6)
Lymphocyte %: 11.3 %
MCH: 29.9 pg (ref 26.0–34.0)
MCHC: 34.1 g/dL (ref 32.0–36.0)
MCV: 88 fL (ref 80–100)
MONOS PCT: 7.7 %
Monocyte #: 0.9 x10 3/mm (ref 0.2–0.9)
NEUTROS PCT: 78.3 %
Neutrophil #: 9 10*3/uL — ABNORMAL HIGH (ref 1.4–6.5)
Platelet: 331 10*3/uL (ref 150–440)
RBC: 4.14 10*6/uL (ref 3.80–5.20)
RDW: 12.8 % (ref 11.5–14.5)
WBC: 11.5 10*3/uL — AB (ref 3.6–11.0)

## 2013-10-13 LAB — LIPASE, BLOOD: Lipase: 109 U/L (ref 73–393)

## 2013-10-14 ENCOUNTER — Inpatient Hospital Stay: Payer: Self-pay | Admitting: General Surgery

## 2013-10-14 DIAGNOSIS — K5732 Diverticulitis of large intestine without perforation or abscess without bleeding: Secondary | ICD-10-CM | POA: Diagnosis not present

## 2013-10-14 DIAGNOSIS — R319 Hematuria, unspecified: Secondary | ICD-10-CM | POA: Diagnosis not present

## 2013-10-14 DIAGNOSIS — Z885 Allergy status to narcotic agent status: Secondary | ICD-10-CM | POA: Diagnosis not present

## 2013-10-14 DIAGNOSIS — I10 Essential (primary) hypertension: Secondary | ICD-10-CM | POA: Diagnosis not present

## 2013-10-14 DIAGNOSIS — K630 Abscess of intestine: Secondary | ICD-10-CM | POA: Diagnosis not present

## 2013-10-14 DIAGNOSIS — Z884 Allergy status to anesthetic agent status: Secondary | ICD-10-CM | POA: Diagnosis not present

## 2013-10-14 DIAGNOSIS — Z883 Allergy status to other anti-infective agents status: Secondary | ICD-10-CM | POA: Diagnosis not present

## 2013-10-14 DIAGNOSIS — Z79899 Other long term (current) drug therapy: Secondary | ICD-10-CM | POA: Diagnosis not present

## 2013-10-14 DIAGNOSIS — K219 Gastro-esophageal reflux disease without esophagitis: Secondary | ICD-10-CM | POA: Diagnosis present

## 2013-10-14 DIAGNOSIS — Z7982 Long term (current) use of aspirin: Secondary | ICD-10-CM | POA: Diagnosis not present

## 2013-10-14 DIAGNOSIS — E871 Hypo-osmolality and hyponatremia: Secondary | ICD-10-CM | POA: Diagnosis present

## 2013-10-14 LAB — CBC WITH DIFFERENTIAL/PLATELET
Basophil #: 0.1 10*3/uL (ref 0.0–0.1)
Basophil %: 0.8 %
EOS ABS: 0.2 10*3/uL (ref 0.0–0.7)
EOS PCT: 1.7 %
HCT: 32.9 % — ABNORMAL LOW (ref 35.0–47.0)
HGB: 11.3 g/dL — AB (ref 12.0–16.0)
LYMPHS ABS: 1.3 10*3/uL (ref 1.0–3.6)
Lymphocyte %: 13.3 %
MCH: 30.2 pg (ref 26.0–34.0)
MCHC: 34.4 g/dL (ref 32.0–36.0)
MCV: 88 fL (ref 80–100)
MONO ABS: 0.9 x10 3/mm (ref 0.2–0.9)
Monocyte %: 8.8 %
Neutrophil #: 7.4 10*3/uL — ABNORMAL HIGH (ref 1.4–6.5)
Neutrophil %: 75.4 %
PLATELETS: 275 10*3/uL (ref 150–440)
RBC: 3.75 10*6/uL — AB (ref 3.80–5.20)
RDW: 12.7 % (ref 11.5–14.5)
WBC: 9.8 10*3/uL (ref 3.6–11.0)

## 2013-10-14 LAB — BASIC METABOLIC PANEL
Anion Gap: 8 (ref 7–16)
BUN: 15 mg/dL (ref 7–18)
CHLORIDE: 96 mmol/L — AB (ref 98–107)
Calcium, Total: 8.9 mg/dL (ref 8.5–10.1)
Co2: 27 mmol/L (ref 21–32)
Creatinine: 1.13 mg/dL (ref 0.60–1.30)
EGFR (African American): 54 — ABNORMAL LOW
GFR CALC NON AF AMER: 47 — AB
GLUCOSE: 95 mg/dL (ref 65–99)
OSMOLALITY: 263 (ref 275–301)
Potassium: 2.9 mmol/L — ABNORMAL LOW (ref 3.5–5.1)
Sodium: 131 mmol/L — ABNORMAL LOW (ref 136–145)

## 2013-10-16 LAB — CBC WITH DIFFERENTIAL/PLATELET
BASOS ABS: 0.1 10*3/uL (ref 0.0–0.1)
Basophil %: 0.8 %
EOS PCT: 3.2 %
Eosinophil #: 0.3 10*3/uL (ref 0.0–0.7)
HCT: 31.6 % — AB (ref 35.0–47.0)
HGB: 11.1 g/dL — AB (ref 12.0–16.0)
Lymphocyte #: 1.1 10*3/uL (ref 1.0–3.6)
Lymphocyte %: 13.5 %
MCH: 31.1 pg (ref 26.0–34.0)
MCHC: 35.1 g/dL (ref 32.0–36.0)
MCV: 89 fL (ref 80–100)
Monocyte #: 0.6 x10 3/mm (ref 0.2–0.9)
Monocyte %: 7.1 %
NEUTROS PCT: 75.4 %
Neutrophil #: 6.3 10*3/uL (ref 1.4–6.5)
PLATELETS: 260 10*3/uL (ref 150–440)
RBC: 3.57 10*6/uL — AB (ref 3.80–5.20)
RDW: 12.6 % (ref 11.5–14.5)
WBC: 8.3 10*3/uL (ref 3.6–11.0)

## 2013-10-18 LAB — CULTURE, BLOOD (SINGLE)

## 2013-10-23 ENCOUNTER — Encounter: Payer: Self-pay | Admitting: General Surgery

## 2013-10-23 ENCOUNTER — Ambulatory Visit (INDEPENDENT_AMBULATORY_CARE_PROVIDER_SITE_OTHER): Payer: Medicare Other | Admitting: General Surgery

## 2013-10-23 VITALS — BP 140/70 | HR 64 | Resp 18 | Ht 62.0 in | Wt 190.0 lb

## 2013-10-23 DIAGNOSIS — K5732 Diverticulitis of large intestine without perforation or abscess without bleeding: Secondary | ICD-10-CM

## 2013-10-23 DIAGNOSIS — K5720 Diverticulitis of large intestine with perforation and abscess without bleeding: Secondary | ICD-10-CM

## 2013-10-23 NOTE — Progress Notes (Signed)
Patient ID: Dawn [name], female   DOB: August 30, 1934, 80 y.o.   MRN: [id number]  Chief Complaint  Patient presents with  . Other    diverticulitis    HPI Dawn [name] is a 80 y.o. female here today following up from her hospital stay for diverticulitis. She had been having llq abd pain for about 10days prior to her admission. CT then showed a small diverticular abscess. The patient states she is still having some weakness and dizziness since being released from the hospital. She is also experiencing some diarrhea but believes its from the antibiotics she is taking. She denies any fever or chills. Her pain is much better, but not resolved.  HPI  Past Medical History  Diagnosis Date  . Arthritis   . Hypertension   . Stomach ulcer   . Acid reflux   . Gastric polyp 2014    Past Surgical History  Procedure Laterality Date  . Upper gi endoscopy  2011  . Vascular surgery Right 2010    right rental artery   . Cardiac catheterization  2007    normal, [employer] [facility] [place]   . Colonoscopy  2011    Dr. Nicolasa Ducking  . Cataract surgery   2013  . Laminectomy  2011    decompression   . Back surgery  2011    vertebroplasty   . Carpal tunnel release  2007    Family History  Problem Relation Age of Onset  . Cancer Father     stomach  . Breast cancer Paternal Aunt   . Breast cancer Paternal Grandmother   . Breast cancer Paternal Aunt   . Breast cancer Paternal Aunt   . Breast cancer Paternal Aunt     Social History History  Substance Use Topics  . Smoking status: Never Smoker   . Smokeless tobacco: Never Used  . Alcohol Use: No    Allergies  Allergen Reactions  . Quinine Anaphylaxis  . Quinine Derivatives Swelling  . Celecoxib Rash  . Codeine Rash    Current Outpatient Prescriptions  Medication Sig Dispense Refill  . aspirin 81 MG tablet Take 81 mg by mouth daily.      [name] atorvastatin (LIPITOR) 20 MG tablet Take 1 tablet by mouth daily.      . Biotin 2500 MCG  CAPS Take 5,000 mcg by mouth daily.       . Calcium Carbonate-Vit D-Min (CALCIUM 600 + MINERALS) 600-200 MG-UNIT TABS Take by mouth.      . ciprofloxacin (CIPRO) 500 MG tablet Take 1 tablet by mouth daily.      . fexofenadine (ALLEGRA) 180 MG tablet Take 180 mg by mouth daily.      [name] gabapentin (NEURONTIN) 100 MG capsule Take 1 capsule by mouth daily.      [name] HYDROcodone-acetaminophen (NORCO/VICODIN) 5-325 MG per tablet Take 1 tablet by mouth as needed.      . labetalol (NORMODYNE) 300 MG tablet Take 200 mg by mouth 2 (two) times daily.      . metroNIDAZOLE (FLAGYL) 500 MG tablet Take 1 tablet by mouth daily.      . Olmesartan-Amlodipine-HCTZ (TRIBENZOR) 40-10-25 MG TABS Take by mouth.      . ranitidine (ZANTAC) 150 MG tablet Take 1 tablet by mouth 2 (two) times daily.        No current facility-administered medications for this visit.    Review of Systems Review of Systems  Constitutional: Negative.   Respiratory: Negative.   Cardiovascular: Negative.  Blood pressure 140/70, pulse 64, resp. rate 18, height _0  (1.575 m), weight 190 lb (86.183 kg).  Physical Exam Physical Exam  Constitutional: She is oriented to person, place, and time. She appears well-developed and well-nourished.  Eyes: Conjunctivae are normal. No scleral icterus.  Cardiovascular: Normal rate, regular rhythm and normal heart sounds.   Pulmonary/Chest: Effort normal and breath sounds normal.  Abdominal: Soft. Normal appearance and bowel sounds are normal. There is no hepatosplenomegaly. There is tenderness (mild tenderness wtih deep palpation. ) in the left lower quadrant. There is no rigidity and no guarding. No hernia.  Neurological: She is alert and oriented to person, place, and time.  Skin: Skin is warm and dry.    Data Reviewed CT from 10 days ago showed a small diverticular abscess.   Assessment    Patient completing 10 days of antibiotics with improvement. Pain not resolved yet.     Plan     Recheck CBC today at [facility]. Follow up CT next week.     Patient has been scheduled for a CT abdomen/pelvis with contrast at [facility] for 10-28-13 at 11:30 am (arrive 11:15 am). Prep: no solids 4 hours prior but patient may have clear liquids up until exam time, pick up prep kit, and take medication list. Patient verbalizes understanding.    [name] 10/24/2013, 9:05 AM

## 2013-10-23 NOTE — Patient Instructions (Addendum)
Patient to be scheduled for CT scan next week. Labs to be drawn today. Patient to return in 2 weeks.   Patient has been scheduled for a CT abdomen/pelvis with contrast at [facility] for 10-28-13 at 11:30 am (arrive 11:15 am). Prep: no solids 4 hours prior but patient may have clear liquids up until exam time, pick up prep kit, and take medication list. Patient verbalizes understanding.   

## 2013-10-24 ENCOUNTER — Encounter: Payer: Self-pay | Admitting: General Surgery

## 2013-10-24 LAB — CBC WITH DIFFERENTIAL/PLATELET
Basophils Absolute: 0.1 10*3/uL (ref 0.0–0.2)
Basos: 1 %
EOS: 3 %
Eosinophils Absolute: 0.2 10*3/uL (ref 0.0–0.4)
HEMATOCRIT: 35.9 % (ref 34.0–46.6)
HEMOGLOBIN: 12.2 g/dL (ref 11.1–15.9)
Immature Grans (Abs): 0 10*3/uL (ref 0.0–0.1)
Immature Granulocytes: 0 %
LYMPHS ABS: 1.4 10*3/uL (ref 0.7–3.1)
Lymphs: 16 %
MCH: 30 pg (ref 26.6–33.0)
MCHC: 34 g/dL (ref 31.5–35.7)
MCV: 88 fL (ref 79–97)
MONOCYTES: 10 %
Monocytes Absolute: 0.8 10*3/uL (ref 0.1–0.9)
NEUTROS ABS: 6.1 10*3/uL (ref 1.4–7.0)
Neutrophils Relative %: 70 %
RBC: 4.07 x10E6/uL (ref 3.77–5.28)
RDW: 12.8 % (ref 12.3–15.4)
WBC: 8.7 10*3/uL (ref 3.4–10.8)

## 2013-10-30 ENCOUNTER — Ambulatory Visit: Payer: Self-pay | Admitting: General Surgery

## 2013-10-30 DIAGNOSIS — N289 Disorder of kidney and ureter, unspecified: Secondary | ICD-10-CM | POA: Diagnosis not present

## 2013-10-30 DIAGNOSIS — K5732 Diverticulitis of large intestine without perforation or abscess without bleeding: Secondary | ICD-10-CM | POA: Diagnosis not present

## 2013-11-05 ENCOUNTER — Encounter: Payer: Self-pay | Admitting: General Surgery

## 2013-11-05 ENCOUNTER — Ambulatory Visit (INDEPENDENT_AMBULATORY_CARE_PROVIDER_SITE_OTHER): Payer: Medicare Other | Admitting: General Surgery

## 2013-11-05 VITALS — BP 100/62 | HR 76 | Resp 16 | Ht 62.0 in | Wt 189.0 lb

## 2013-11-05 DIAGNOSIS — K5732 Diverticulitis of large intestine without perforation or abscess without bleeding: Secondary | ICD-10-CM

## 2013-11-05 DIAGNOSIS — K5720 Diverticulitis of large intestine with perforation and abscess without bleeding: Secondary | ICD-10-CM

## 2013-11-05 NOTE — Progress Notes (Signed)
Patient ID: Dawn [name], female   DOB: December 04, 1933, 80 y.o.   MRN: [id number]   The patient presents today for a 2 week follow up CT scan of the abdomen/pelvis. No new complaints at this time.   She reports minimal pain in left lower quadrant area. No bowel problems. Denies fever or chills. Abdominal exam shows scant tenderness left lower quadrant on deep palpation.   Follow up CT scan shows stranding in sigmoid area improved and the abscess is smaller. Appears pt is making good progress.   Repeat CT scan in one month.  Patient has been scheduled for a CT abdomen/pelvis with contrast at [facility] for 12-02-13 at 11 am (arrive 10:45 am). Prep: no solids 4 hours prior but patient may have clear liquids up until exam time, pick up prep kit, and take medication list. Patient verbalizes understanding.  This patient will follow up in the office once scan is complete.

## 2013-11-05 NOTE — Patient Instructions (Addendum)
The patient is aware to call back for any questions or concerns.  

## 2013-11-24 DIAGNOSIS — R109 Unspecified abdominal pain: Secondary | ICD-10-CM | POA: Diagnosis not present

## 2013-11-24 DIAGNOSIS — IMO0001 Reserved for inherently not codable concepts without codable children: Secondary | ICD-10-CM | POA: Diagnosis not present

## 2013-11-24 DIAGNOSIS — I10 Essential (primary) hypertension: Secondary | ICD-10-CM | POA: Diagnosis not present

## 2013-11-24 DIAGNOSIS — D649 Anemia, unspecified: Secondary | ICD-10-CM | POA: Diagnosis not present

## 2013-11-24 DIAGNOSIS — R5381 Other malaise: Secondary | ICD-10-CM | POA: Diagnosis not present

## 2013-12-02 ENCOUNTER — Ambulatory Visit: Payer: Self-pay | Admitting: General Surgery

## 2013-12-02 DIAGNOSIS — K5732 Diverticulitis of large intestine without perforation or abscess without bleeding: Secondary | ICD-10-CM | POA: Diagnosis not present

## 2013-12-02 DIAGNOSIS — E278 Other specified disorders of adrenal gland: Secondary | ICD-10-CM | POA: Diagnosis not present

## 2013-12-10 ENCOUNTER — Ambulatory Visit (INDEPENDENT_AMBULATORY_CARE_PROVIDER_SITE_OTHER): Payer: Medicare Other | Admitting: General Surgery

## 2013-12-10 ENCOUNTER — Encounter: Payer: Self-pay | Admitting: General Surgery

## 2013-12-10 VITALS — BP 120/72 | HR 68 | Resp 12 | Ht 62.0 in | Wt 191.0 lb

## 2013-12-10 DIAGNOSIS — K5720 Diverticulitis of large intestine with perforation and abscess without bleeding: Secondary | ICD-10-CM

## 2013-12-10 DIAGNOSIS — N321 Vesicointestinal fistula: Secondary | ICD-10-CM | POA: Diagnosis not present

## 2013-12-10 DIAGNOSIS — K5732 Diverticulitis of large intestine without perforation or abscess without bleeding: Secondary | ICD-10-CM | POA: Diagnosis not present

## 2013-12-10 MED ORDER — METRONIDAZOLE 500 MG PO TABS: ORAL_TABLET | ORAL | Status: DC

## 2013-12-10 MED ORDER — NEOMYCIN SULFATE 500 MG PO TABS: ORAL_TABLET | ORAL | Status: DC

## 2013-12-10 MED ORDER — POLYETHYLENE GLYCOL 3350 17 GM/SCOOP PO POWD: ORAL | Status: DC

## 2013-12-10 NOTE — Progress Notes (Signed)
Patient ID: Dawn [name], female   DOB: [birth year], 80 y.o.   MRN: [id number]  Chief Complaint  Patient presents with  . Follow-up    abd/ pelvis results    HPI Dawn [name] is a 80 y.o. female.  Here today for follow up abdominal pain, diverticular abscess and review CT scan done 12-02-13. States the pain is much better now. She states she does notice some air coming out when she passes her urine but no stool.  HPI  Past Medical History  Diagnosis Date  . Arthritis   . Hypertension   . Stomach ulcer   . Acid reflux   . Gastric polyp 2014    Past Surgical History  Procedure Laterality Date  . Upper gi endoscopy  2011  . Vascular surgery Right 2010    right rental artery   . Cardiac catheterization  2007    normal, [employer] [facility] [place]   . Colonoscopy  2011, 2015    Dr. Nicolasa Ducking, Dr Melody Comas  . Cataract surgery   2013  . Laminectomy  2011    decompression   . Back surgery  2011    vertebroplasty   . Carpal tunnel release  2007    Family History  Problem Relation Age of Onset  . Cancer Father     stomach  . Breast cancer Paternal Aunt   . Breast cancer Paternal Grandmother   . Breast cancer Paternal Aunt   . Breast cancer Paternal Aunt   . Breast cancer Paternal Aunt     Social History History  Substance Use Topics  . Smoking status: Never Smoker   . Smokeless tobacco: Never Used  . Alcohol Use: No    Allergies  Allergen Reactions  . Quinine Anaphylaxis  . Quinine Derivatives Swelling  . Celecoxib Rash  . Codeine Rash    Current Outpatient Prescriptions  Medication Sig Dispense Refill  . aspirin 81 MG tablet Take 81 mg by mouth daily.      [name] atorvastatin (LIPITOR) 20 MG tablet Take 1 tablet by mouth daily.      . Biotin 2500 MCG CAPS Take 5,000 mcg by mouth daily.       . Calcium Carbonate-Vit D-Min (CALCIUM 600 + MINERALS) 600-200 MG-UNIT TABS Take by mouth.      . fexofenadine (ALLEGRA) 180 MG tablet Take 180 mg by mouth daily.       [name] gabapentin (NEURONTIN) 100 MG capsule Take 1 capsule by mouth daily.      [name] labetalol (NORMODYNE) 300 MG tablet Take 200 mg by mouth 2 (two) times daily.      . Olmesartan-Amlodipine-HCTZ (TRIBENZOR) 40-10-25 MG TABS Take by mouth.      . ranitidine (ZANTAC) 150 MG tablet Take 1 tablet by mouth 2 (two) times daily.       . metroNIDAZOLE (FLAGYL) 500 MG tablet Take one (1) tablet at 6 pm and one (1) tablet at 11 pm the evening prior to surgery.  2 tablet  0  . neomycin (MYCIFRADIN) 500 MG tablet Take two (2) tablets at 6 pm and two (2) tablets at 11 pm the evening prior to surgery.  4 tablet  0  . polyethylene glycol powder (GLYCOLAX/MIRALAX) powder 255 grams one bottle for colonoscopy prep  255 g  0   No current facility-administered medications for this visit.    Review of Systems Review of Systems  Constitutional: Negative.   Respiratory: Negative.   Cardiovascular: Negative.   Gastrointestinal: Negative  for nausea, abdominal pain and diarrhea.    Blood pressure 120/72, pulse 68, resp. rate 12, height 5\' 2"  (1.575 m), weight 191 lb (86.637 kg).  Physical Exam Physical Exam  Constitutional: She is oriented to person, place, and time. She appears well-developed and well-nourished.  Eyes: Conjunctivae are normal.  Neck: Neck supple.  Cardiovascular: Normal rate, regular rhythm and normal heart sounds.   Pulmonary/Chest: Effort normal and breath sounds normal.  Abdominal: Soft. Normal appearance and bowel sounds are normal. There is no tenderness.  Lymphadenopathy:    She has no cervical adenopathy.  Neurological: She is alert and oriented to person, place, and time.  Skin: Skin is warm and dry.    Data Reviewed CT scan shows abscess near the sigmoid mostly air filled and sitting adjacent to the bladder which is thickening in its wall. There is air seen within the bladder.  Assessment    Stable physical exam. Findings and symptoms suggest colovesical fistula.     Plan    Recommended sigmoid resection and repair bladder.  Procedure, reasons, risks and benefits explained. She is agreeable.    Patient's surgery has been scheduled for 12-19-13 at [facility]. It is okay for patient to continue 81 mg aspirin. Patient has been asked to complete a bowel prep pre-op and is aware of all instructions.   Kamyah Wilhelmsen G 12/10/2013, 12:43 PM

## 2013-12-10 NOTE — Patient Instructions (Addendum)
The patient is aware to call back for any questions or concerns.  Patient's surgery has been scheduled for 12-19-13 at [facility]. It is okay for patient to continue 81 mg aspirin.

## 2013-12-11 LAB — URINALYSIS, COMPLETE
BILIRUBIN UA: NEGATIVE
GLUCOSE, UA: NEGATIVE
Ketones, UA: NEGATIVE
Leukocytes, UA: NEGATIVE
NITRITE UA: NEGATIVE
Protein, UA: NEGATIVE
RBC, UA: NEGATIVE
SPEC GRAV UA: 1.011 (ref 1.005–1.030)
UUROB: 0.2 mg/dL (ref 0.0–1.9)
pH, UA: 6 (ref 5.0–7.5)

## 2013-12-11 LAB — CBC WITH DIFFERENTIAL/PLATELET
BASOS: 1 %
Basophils Absolute: 0.1 10*3/uL (ref 0.0–0.2)
EOS: 4 %
Eosinophils Absolute: 0.2 10*3/uL (ref 0.0–0.4)
HCT: 39.8 % (ref 34.0–46.6)
Hemoglobin: 13 g/dL (ref 11.1–15.9)
IMMATURE GRANS (ABS): 0 10*3/uL (ref 0.0–0.1)
IMMATURE GRANULOCYTES: 0 %
Lymphocytes Absolute: 1.4 10*3/uL (ref 0.7–3.1)
Lymphs: 29 %
MCH: 29.5 pg (ref 26.6–33.0)
MCHC: 32.7 g/dL (ref 31.5–35.7)
MCV: 91 fL (ref 79–97)
MONOCYTES: 6 %
Monocytes Absolute: 0.3 10*3/uL (ref 0.1–0.9)
NEUTROS PCT: 60 %
Neutrophils Absolute: 2.9 10*3/uL (ref 1.4–7.0)
RBC: 4.4 x10E6/uL (ref 3.77–5.28)
RDW: 15 % (ref 12.3–15.4)
WBC: 4.9 10*3/uL (ref 3.4–10.8)

## 2013-12-11 LAB — BASIC METABOLIC PANEL
BUN/Creatinine Ratio: 15 (ref 11–26)
BUN: 21 mg/dL (ref 8–27)
CALCIUM: 9.9 mg/dL (ref 8.7–10.3)
CO2: 21 mmol/L (ref 18–29)
CREATININE: 1.39 mg/dL — AB (ref 0.57–1.00)
Chloride: 95 mmol/L — ABNORMAL LOW (ref 97–108)
GFR calc Af Amer: 42 mL/min/{1.73_m2} — ABNORMAL LOW (ref >59)
GFR, EST NON AFRICAN AMERICAN: 37 mL/min/{1.73_m2} — AB (ref >59)
GLUCOSE: 100 mg/dL — AB (ref 65–99)
Potassium: 4.5 mmol/L (ref 3.5–5.2)
Sodium: 137 mmol/L (ref 134–144)

## 2013-12-11 LAB — MICROSCOPIC EXAMINATION
BACTERIA UA: NONE SEEN
RBC, UA: NONE SEEN /hpf (ref 0–?)

## 2013-12-15 ENCOUNTER — Ambulatory Visit: Payer: Self-pay | Admitting: General Surgery

## 2013-12-15 ENCOUNTER — Encounter: Payer: Self-pay | Admitting: General Surgery

## 2013-12-15 DIAGNOSIS — Z01812 Encounter for preprocedural laboratory examination: Secondary | ICD-10-CM | POA: Diagnosis not present

## 2013-12-15 DIAGNOSIS — Z01810 Encounter for preprocedural cardiovascular examination: Secondary | ICD-10-CM | POA: Diagnosis not present

## 2013-12-15 DIAGNOSIS — Z9889 Other specified postprocedural states: Secondary | ICD-10-CM | POA: Diagnosis not present

## 2013-12-15 DIAGNOSIS — K219 Gastro-esophageal reflux disease without esophagitis: Secondary | ICD-10-CM | POA: Diagnosis not present

## 2013-12-15 DIAGNOSIS — I10 Essential (primary) hypertension: Secondary | ICD-10-CM | POA: Diagnosis not present

## 2013-12-17 DIAGNOSIS — K5710 Diverticulosis of small intestine without perforation or abscess without bleeding: Secondary | ICD-10-CM | POA: Diagnosis not present

## 2013-12-17 DIAGNOSIS — D649 Anemia, unspecified: Secondary | ICD-10-CM | POA: Diagnosis not present

## 2013-12-17 DIAGNOSIS — R109 Unspecified abdominal pain: Secondary | ICD-10-CM | POA: Diagnosis not present

## 2013-12-17 DIAGNOSIS — I10 Essential (primary) hypertension: Secondary | ICD-10-CM | POA: Diagnosis not present

## 2013-12-17 DIAGNOSIS — IMO0001 Reserved for inherently not codable concepts without codable children: Secondary | ICD-10-CM | POA: Diagnosis not present

## 2013-12-19 ENCOUNTER — Inpatient Hospital Stay: Payer: Self-pay | Admitting: General Surgery

## 2013-12-19 DIAGNOSIS — J309 Allergic rhinitis, unspecified: Secondary | ICD-10-CM | POA: Diagnosis present

## 2013-12-19 DIAGNOSIS — E876 Hypokalemia: Secondary | ICD-10-CM | POA: Diagnosis not present

## 2013-12-19 DIAGNOSIS — Z881 Allergy status to other antibiotic agents status: Secondary | ICD-10-CM | POA: Diagnosis not present

## 2013-12-19 DIAGNOSIS — Z7982 Long term (current) use of aspirin: Secondary | ICD-10-CM | POA: Diagnosis not present

## 2013-12-19 DIAGNOSIS — K630 Abscess of intestine: Secondary | ICD-10-CM | POA: Diagnosis present

## 2013-12-19 DIAGNOSIS — Z803 Family history of malignant neoplasm of breast: Secondary | ICD-10-CM | POA: Diagnosis not present

## 2013-12-19 DIAGNOSIS — I10 Essential (primary) hypertension: Secondary | ICD-10-CM | POA: Diagnosis present

## 2013-12-19 DIAGNOSIS — G609 Hereditary and idiopathic neuropathy, unspecified: Secondary | ICD-10-CM | POA: Diagnosis present

## 2013-12-19 DIAGNOSIS — Z8673 Personal history of transient ischemic attack (TIA), and cerebral infarction without residual deficits: Secondary | ICD-10-CM | POA: Diagnosis not present

## 2013-12-19 DIAGNOSIS — Z885 Allergy status to narcotic agent status: Secondary | ICD-10-CM | POA: Diagnosis not present

## 2013-12-19 DIAGNOSIS — Z79899 Other long term (current) drug therapy: Secondary | ICD-10-CM | POA: Diagnosis not present

## 2013-12-19 DIAGNOSIS — M129 Arthropathy, unspecified: Secondary | ICD-10-CM | POA: Diagnosis present

## 2013-12-19 DIAGNOSIS — K5732 Diverticulitis of large intestine without perforation or abscess without bleeding: Secondary | ICD-10-CM | POA: Diagnosis not present

## 2013-12-19 DIAGNOSIS — Z6834 Body mass index (BMI) 34.0-34.9, adult: Secondary | ICD-10-CM | POA: Diagnosis not present

## 2013-12-19 DIAGNOSIS — K259 Gastric ulcer, unspecified as acute or chronic, without hemorrhage or perforation: Secondary | ICD-10-CM | POA: Diagnosis present

## 2013-12-19 DIAGNOSIS — K660 Peritoneal adhesions (postprocedural) (postinfection): Secondary | ICD-10-CM | POA: Diagnosis present

## 2013-12-19 DIAGNOSIS — N3289 Other specified disorders of bladder: Secondary | ICD-10-CM | POA: Diagnosis present

## 2013-12-19 DIAGNOSIS — E669 Obesity, unspecified: Secondary | ICD-10-CM | POA: Diagnosis present

## 2013-12-19 LAB — CREATININE, SERUM
CREATININE: 1.27 mg/dL (ref 0.60–1.30)
EGFR (Non-African Amer.): 41 — ABNORMAL LOW
GFR CALC AF AMER: 47 — AB

## 2013-12-20 LAB — CBC WITH DIFFERENTIAL/PLATELET
Basophil #: 0.1 10*3/uL (ref 0.0–0.1)
Basophil %: 1.2 %
Eosinophil #: 0.2 10*3/uL (ref 0.0–0.7)
Eosinophil %: 2.4 %
HCT: 33.1 % — ABNORMAL LOW (ref 35.0–47.0)
HGB: 10.6 g/dL — ABNORMAL LOW (ref 12.0–16.0)
LYMPHS ABS: 1 10*3/uL (ref 1.0–3.6)
Lymphocyte %: 16 %
MCH: 28.6 pg (ref 26.0–34.0)
MCHC: 32.2 g/dL (ref 32.0–36.0)
MCV: 89 fL (ref 80–100)
Monocyte #: 0.5 x10 3/mm (ref 0.2–0.9)
Monocyte %: 8.1 %
NEUTROS ABS: 4.4 10*3/uL (ref 1.4–6.5)
Neutrophil %: 72.3 %
Platelet: 198 10*3/uL (ref 150–440)
RBC: 3.72 10*6/uL — ABNORMAL LOW (ref 3.80–5.20)
RDW: 15.4 % — ABNORMAL HIGH (ref 11.5–14.5)
WBC: 6.2 10*3/uL (ref 3.6–11.0)

## 2013-12-20 LAB — BASIC METABOLIC PANEL
Anion Gap: 6 — ABNORMAL LOW (ref 7–16)
BUN: 12 mg/dL (ref 7–18)
CREATININE: 1.32 mg/dL — AB (ref 0.60–1.30)
Calcium, Total: 7.9 mg/dL — ABNORMAL LOW (ref 8.5–10.1)
Chloride: 102 mmol/L (ref 98–107)
Co2: 26 mmol/L (ref 21–32)
EGFR (Non-African Amer.): 39 — ABNORMAL LOW
GFR CALC AF AMER: 45 — AB
Glucose: 119 mg/dL — ABNORMAL HIGH (ref 65–99)
OSMOLALITY: 269 (ref 275–301)
POTASSIUM: 3.3 mmol/L — AB (ref 3.5–5.1)
SODIUM: 134 mmol/L — AB (ref 136–145)

## 2013-12-22 ENCOUNTER — Encounter: Payer: Self-pay | Admitting: General Surgery

## 2013-12-22 LAB — CBC WITH DIFFERENTIAL/PLATELET
Basophil #: 0.1 10*3/uL (ref 0.0–0.1)
Basophil %: 0.7 %
EOS PCT: 1.7 %
Eosinophil #: 0.1 10*3/uL (ref 0.0–0.7)
HCT: 36.6 % (ref 35.0–47.0)
HGB: 12 g/dL (ref 12.0–16.0)
LYMPHS ABS: 1.1 10*3/uL (ref 1.0–3.6)
Lymphocyte %: 13.2 %
MCH: 29.3 pg (ref 26.0–34.0)
MCHC: 32.9 g/dL (ref 32.0–36.0)
MCV: 89 fL (ref 80–100)
Monocyte #: 0.6 x10 3/mm (ref 0.2–0.9)
Monocyte %: 7.4 %
NEUTROS ABS: 6.3 10*3/uL (ref 1.4–6.5)
Neutrophil %: 77 %
Platelet: 222 10*3/uL (ref 150–440)
RBC: 4.11 10*6/uL (ref 3.80–5.20)
RDW: 15.2 % — ABNORMAL HIGH (ref 11.5–14.5)
WBC: 8.2 10*3/uL (ref 3.6–11.0)

## 2013-12-22 LAB — POTASSIUM: POTASSIUM: 3.1 mmol/L — AB (ref 3.5–5.1)

## 2013-12-23 LAB — POTASSIUM: Potassium: 3.2 mmol/L — ABNORMAL LOW (ref 3.5–5.1)

## 2013-12-24 LAB — CREATININE, SERUM
Creatinine: 0.91 mg/dL (ref 0.60–1.30)
EGFR (African American): >60

## 2013-12-24 LAB — POTASSIUM: Potassium: 3.4 mmol/L — ABNORMAL LOW (ref 3.5–5.1)

## 2013-12-25 LAB — PATHOLOGY REPORT

## 2014-01-01 ENCOUNTER — Encounter: Payer: Self-pay | Admitting: General Surgery

## 2014-01-01 ENCOUNTER — Ambulatory Visit (INDEPENDENT_AMBULATORY_CARE_PROVIDER_SITE_OTHER): Payer: Self-pay | Admitting: General Surgery

## 2014-01-01 ENCOUNTER — Ambulatory Visit: Payer: Medicare Other | Admitting: General Surgery

## 2014-01-01 VITALS — BP 124/68 | HR 66 | Resp 14 | Ht 62.5 in | Wt 187.0 lb

## 2014-01-01 DIAGNOSIS — K5732 Diverticulitis of large intestine without perforation or abscess without bleeding: Secondary | ICD-10-CM

## 2014-01-01 DIAGNOSIS — K5720 Diverticulitis of large intestine with perforation and abscess without bleeding: Secondary | ICD-10-CM

## 2014-01-01 DIAGNOSIS — N321 Vesicointestinal fistula: Secondary | ICD-10-CM

## 2014-01-01 NOTE — Progress Notes (Signed)
Patient ID: Dawn [name], female   DOB: 1933-10-30, 80 y.o.   MRN: [id number]   The patient is here post op for laparoscopic sigmoid resection. The procedure was performed on 12/20/13. The patient denies any new problems at this time.  Eating and bowels are doing great.   Abdominal incision and port sites are intact and healing well. Abdomen is soft, good bowel sounds. Lungs clear.  Path- focal diverticulitis, with abscess.

## 2014-01-01 NOTE — Patient Instructions (Addendum)
The patient is aware to call back for any questions or concerns. Patient to return in 3-4 weeks for follow up.  No exertional activity. May drive short distances.

## 2014-01-02 ENCOUNTER — Encounter: Payer: Self-pay | Admitting: General Surgery

## 2014-01-05 ENCOUNTER — Encounter: Payer: Self-pay | Admitting: General Surgery

## 2014-01-28 ENCOUNTER — Ambulatory Visit (INDEPENDENT_AMBULATORY_CARE_PROVIDER_SITE_OTHER): Payer: Self-pay | Admitting: General Surgery

## 2014-01-28 ENCOUNTER — Encounter: Payer: Self-pay | Admitting: General Surgery

## 2014-01-28 VITALS — BP 142/64 | HR 66 | Resp 14 | Ht 62.0 in | Wt 186.0 lb

## 2014-01-28 DIAGNOSIS — K5732 Diverticulitis of large intestine without perforation or abscess without bleeding: Secondary | ICD-10-CM

## 2014-01-28 DIAGNOSIS — K5720 Diverticulitis of large intestine with perforation and abscess without bleeding: Secondary | ICD-10-CM

## 2014-01-28 DIAGNOSIS — N321 Vesicointestinal fistula: Secondary | ICD-10-CM

## 2014-01-28 NOTE — Progress Notes (Signed)
The patient is here post op for laparoscopic sigmoid resection. The procedure was performed on 12/20/13. The patient denies any new problems at this time. No gastrointestinal issues. Bowels move every other day and no bleeding noted.  Abdominal incision well healed. Abdomen is soft non tender with active bowel sounds. Lungs clear.  Satisfactory recovery post laparoscopic sigmoid resection for focal diverticulitis with early colovesical fistula. Patient may return to normal activity. Follow up as needed.

## 2014-01-28 NOTE — Patient Instructions (Signed)
Patient may return to normal activity. Follow up as needed.

## 2014-03-18 DIAGNOSIS — R109 Unspecified abdominal pain: Secondary | ICD-10-CM | POA: Diagnosis not present

## 2014-03-18 DIAGNOSIS — IMO0001 Reserved for inherently not codable concepts without codable children: Secondary | ICD-10-CM | POA: Diagnosis not present

## 2014-03-18 DIAGNOSIS — D649 Anemia, unspecified: Secondary | ICD-10-CM | POA: Diagnosis not present

## 2014-03-18 DIAGNOSIS — K5710 Diverticulosis of small intestine without perforation or abscess without bleeding: Secondary | ICD-10-CM | POA: Diagnosis not present

## 2014-03-18 DIAGNOSIS — I10 Essential (primary) hypertension: Secondary | ICD-10-CM | POA: Diagnosis not present

## 2014-08-11 ENCOUNTER — Ambulatory Visit: Payer: Self-pay | Admitting: Family Medicine

## 2014-08-11 DIAGNOSIS — M25462 Effusion, left knee: Secondary | ICD-10-CM | POA: Diagnosis not present

## 2014-08-11 DIAGNOSIS — M1990 Unspecified osteoarthritis, unspecified site: Secondary | ICD-10-CM | POA: Diagnosis not present

## 2014-08-11 DIAGNOSIS — M1712 Unilateral primary osteoarthritis, left knee: Secondary | ICD-10-CM | POA: Diagnosis not present

## 2014-08-11 DIAGNOSIS — M25562 Pain in left knee: Secondary | ICD-10-CM | POA: Diagnosis not present

## 2014-08-20 DIAGNOSIS — M1712 Unilateral primary osteoarthritis, left knee: Secondary | ICD-10-CM | POA: Diagnosis not present

## 2014-09-08 ENCOUNTER — Ambulatory Visit: Payer: Self-pay | Admitting: Family Medicine

## 2014-09-08 DIAGNOSIS — Z1231 Encounter for screening mammogram for malignant neoplasm of breast: Secondary | ICD-10-CM | POA: Diagnosis not present

## 2014-09-08 LAB — HM MAMMOGRAPHY: HM Mammogram: NORMAL

## 2014-09-16 DIAGNOSIS — K219 Gastro-esophageal reflux disease without esophagitis: Secondary | ICD-10-CM | POA: Diagnosis not present

## 2014-09-16 DIAGNOSIS — I10 Essential (primary) hypertension: Secondary | ICD-10-CM | POA: Diagnosis not present

## 2014-10-15 DIAGNOSIS — H43813 Vitreous degeneration, bilateral: Secondary | ICD-10-CM | POA: Diagnosis not present

## 2014-12-16 DIAGNOSIS — K219 Gastro-esophageal reflux disease without esophagitis: Secondary | ICD-10-CM | POA: Diagnosis not present

## 2014-12-16 DIAGNOSIS — M545 Low back pain: Secondary | ICD-10-CM | POA: Diagnosis not present

## 2014-12-16 DIAGNOSIS — E780 Pure hypercholesterolemia: Secondary | ICD-10-CM | POA: Diagnosis not present

## 2014-12-16 DIAGNOSIS — M1990 Unspecified osteoarthritis, unspecified site: Secondary | ICD-10-CM | POA: Diagnosis not present

## 2014-12-16 DIAGNOSIS — I10 Essential (primary) hypertension: Secondary | ICD-10-CM | POA: Diagnosis not present

## 2014-12-16 DIAGNOSIS — R7309 Other abnormal glucose: Secondary | ICD-10-CM | POA: Diagnosis not present

## 2014-12-16 LAB — TSH: TSH: 2.08 u[IU]/mL (ref <=5.90)

## 2014-12-16 LAB — BASIC METABOLIC PANEL
BUN: 28 mg/dL — AB (ref 4–21)
CREATININE: 1.5 mg/dL — AB (ref <=1.1)
GLUCOSE: 93 mg/dL

## 2014-12-16 LAB — HEMOGLOBIN A1C: Hgb A1c MFr Bld: 5.7 % (ref 4.0–6.0)

## 2014-12-16 LAB — CBC AND DIFFERENTIAL: Platelets: 245 10*3/uL (ref 150–399)

## 2015-01-01 NOTE — Discharge Summary (Signed)
PATIENT NAME:  Dawn [name], Dawn [name] MR#:  [id number] DATE OF BIRTH:  05/31/1934  DATE OF ADMISSION:  06/07/2013 DATE OF DISCHARGE:  06/08/2013   DISCHARGE DIAGNOSES: 1.  Transient perioral numbness, likely secondary to ALLERGY TO AMOXICILLIN, resolved.  2.  Hypertension.  3.  Sinusitis.  4.  History of renal artery stenosis, status post stenting.  5.  Gastroesophageal reflux disease.  6.  Acute renal failure due to dehydration, resolved   DISCHARGE MEDICATIONS: 1.  Labetalol 200 mg 2 tablets p.o. b.i.d.  2.  Allegra 180 mg p.o. daily. 3.  Aspirin 81 mg daily. 4.  Biotin 5 mg p.o. daily.  5.  Tribenzor 40/10/25 mg p.o. daily.  6.  Zantac 150 mg p.o. b.i.d.  7.  Discontinue pantoprazole; the patient is not taking that anymore.  8.  Levaquin 500 mg p.o. daily for 5 days.  9.  Tessalon Perles 500 mg as needed for cough.   CONSULTATIONS: None.   HOSPITAL COURSE: The patient is a 81 year old female patient admitted yesterday because of perioral numbness on the left side, thought to have a TIA. The patient did not have any weakness or numbness in hands. She had a CT head, MRI and carotid ultrasound. All of them were negative for any acute changes. Echo showed EF more than 55%. The patient wanted to be discharged, but developed weakness in the left hand and also perioral numbness, which resolved immediately after 5 to 10 minutes. The patient is seen today, feels still numb on her left side, but no weakness in hands or legs. Neurological exam is essentially normal. The patient says that she started on amoxicillin 2 days back for a sinusitis. Since she started on amoxicillin, she felt like numb and tingly on the left side of the face. I feel that this probably is ALLERGY TO AMOXICILLIN, OR INTOLERANCE TO AMOXICILLIN, rather than acute CVA or TIA. The patient will not get amoxicillin. I started her on Levaquin and some Tessalon Perles for sinusitis that she can continue. She says she has used Levaquin  before.   Acute renal failure due to dehydration and prerenal. BUN 20, creatinine 1.37, potassium 3.4. All that resolved and the repeat labs showed BUN of 14, creatinine 1.21, sodium 130, potassium 3.5. The patient's LDL is at 90. Blood pressure this morning is 149/66, pulse 64, heart rate as I mentioned, temperature 97.5. She is stable for discharge.  TIME SPENT ON DISCHARGE PREPARATION: More than 30 minutes.   ____________________________ Epifanio Lesches, MD sk:jm D: 06/08/2013 10:32:27 ET T: 06/08/2013 11:54:14 ET JOB#: [id number]  cc: Epifanio Lesches, MD, <Dictator> Epifanio Lesches MD ELECTRONICALLY SIGNED 06/24/2013 10:56

## 2015-01-01 NOTE — H&P (Signed)
PATIENT NAME:  Dawn [name], Dawn [name] MR#:  [id number] DATE OF BIRTH:  1933-12-08  DATE OF ADMISSION:  06/07/2013  REFERRING PHYSICIAN:  Dr. Dahlia Client.   PRIMARY CARE PHYSICIAN:  Dr. Rosanna Randy.   Chief Complaint: left face numbness  HISTORY OF PRESENT ILLNESS:  Dawn [name] is a 81 year old Caucasian female with past medical history of hypertension, renal artery stenosis status post stenting, gastroesophageal reflux disease, who is presenting with left facial numbness.  Her symptoms started around 1:30 p.m. on 06/06/2013 with acute onset of left facial numbness located periorally.  Her symptoms persisted and she presented to her PCP at 4:30 p.m., diagnosed with a TIA and was thought he should be able to treat as an outpatient given her mild symptoms, but to present to the Emergency Department if symptoms worsen, then she noted around 2200 left hand paresthesias with the addition of left perioral numbness so she presented to [facility] for further work-up and evaluation.  Initial CT of the head showed no acute findings.  Her symptoms are resolved, currently without complaints.    REVIEW OF SYSTEMS: CONSTITUTIONAL:  Denies fevers, chills, fatigue, weakness.  EYES:  Denies any change in vision or pain.  EARS, NOSE, THROAT:  Denies any ear pain or hearing loss or active sinus pain.  RESPIRATORY:  Denies cough, wheeze, shortness of breath.  CARDIOVASCULAR:  Denies chest pain, palpitations, edema.  GASTROINTESTINAL:  Denies nausea, vomiting, diarrhea, abdominal pain.  GENITOURINARY:  Denies dysuria, hematuria.  ENDOCRINE:  Denies nocturia or thyroid problems.  HEMATOLOGIC AND LYMPHATIC:  Denies easy bruising or bleeding.  SKIN:  Denies rashes or lesions.  MUSCULOSKELETAL:  Denies pain in the neck, back, shoulder, knees.  Denies any arthritis or swelling.  NEUROLOGICAL:  Numbness and paresthesias as above.  Denies any weakness or dysarthria.  Denies tremor, ataxia or headache.  PSYCHIATRIC:  Denies  anxiety or depressive symptoms.  Otherwise, full review of systems performed by me is negative.   PAST MEDICAL HISTORY:  Hypertension, gastroesophageal reflux disease, renal artery stenosis status post stenting.   FAMILY HISTORY:  Significant for hypertension and diabetes.  She also has a brother who had a stroke.   SOCIAL HISTORY:  Denies any alcohol, tobacco or drug usage.   ALLERGIES:  CELEBREX, CODEINE AND QUININE.   HOME MEDICATIONS:  Include Allegra 180 mg by mouth daily, aspirin 81 mg by mouth daily, Biotin 1 tab by mouth daily, calcium 600 plus vitamin D 1 tab by mouth daily, labetalol 200 mg 2 tabs by mouth twice daily , Prevacid 30 mg by mouth daily, Tribenzor 40/10/25 mg by mouth daily, Zantac 150 mg by mouth twice daily.   PHYSICAL EXAMINATION: VITAL SIGNS:  Temperature 98.5, heart rate 73, respirations 22, blood pressure 117/78, saturating 96% on room air.  Weight is 85.3 kg, BMI of 34.4.  GENERAL:  Well-nourished, obese, well-developed Caucasian female in no acute distress.  HEAD:  Normocephalic, atraumatic.  EYES:  Pupils equal, round, react to light as well as accommodation.  Extraocular muscles intact.  No scleral icterus.  MOUTH:  Moist mucosal membranes.  Dentition intact.  No abscess noted.  EAR, NOSE, THROAT:  Clear without exudate.  No external lesions noted.  NECK:  Supple.  No thyromegaly.  No nodules or lymphadenopathy.  No JVD.  PULMONARY:  Clear to auscultation bilaterally without wheezes, rubs or rhonchi.  No use of accessory muscles.  Good respiratory effort.  Chest nontender to palpation.  CARDIOVASCULAR:  S1, S2, with a 3 out of 6  systolic ejection murmur best heard at right upper sternal border.  No edema.  Pedal pulses 2+.  MUSCULOSKELETAL:  No swelling, clubbing or edema.  Full range of motion in all extremities.  NEUROLOGIC:  Cranial nerves II through XII intact.  Pronator drift within normal limits.  Romberg and gait not tested.  Sensation intact.  Reflexes  intact.  Strength 5 out of 5 in all extremities.  SKIN:  No ulcerations, lesions or rashes.  Skin is warm, dry.  Turgor is intact.  PSYCHIATRIC:  Mood and affect within normal limits.  She is alert and oriented x 3.  Insight and judgment are intact.   LABORATORY DATA:  Sodium 131, potassium 3.4, chloride 97, bicarb 30, BUN 20, creatinine 1.37, glucose 126, WBC 7.5, hemoglobin 12.8, hematocrit 36.1, platelets 292.  Urinalysis negative for signs of infection.  CT head, no acute intracranial abnormalities.  Chest x-ray, no acute cardiopulmonary process.   ASSESSMENT AND PLAN:  A 81 year old Caucasian female with history of hypertension, renal artery stenosis status post stenting and gastroesophageal reflux disease presenting with a left facial numbness as well as left hand paresthesia.  1.  Transient ischemic attack.  NIH stroke scale was 0 on presentation to the ED, given aspirin already.  We will give Lipitor now.  Check lipids, MRI of brain, carotid Dopplers, transesophageal echocardiogram and q. 4 hour neuro checks.  2.  Acute kidney injury with unknown baseline creatinine, last documented around 1 back in 2011.  Hold hydrochlorothiazide.  Provide gentle hydration with IV fluid normal saline.  3.  Hypertension.  Continue home medications Norvasc, labetalol, olmesartan with holding of hydrochlorothiazide.  4.  Gastroesophageal reflux disease.  Continue pantoprazole5.  Sinusitis and bronchitis.  She is on amoxicillin per her primary care physician, day #3.  We will continue her therapy.   6.  DVT prophylaxis, heparin subQ.  7.  Hyponatremia.  Continue IV fluid hydration.  8.  THE PATIENT IS FULL CODE.   TIME SPENT:  45 minutes.     ____________________________ Aaron Mose. Luiz Trumpower, MD dkh:ea D: 06/07/2013 03:38:00 ET T: 06/07/2013 04:30:47 ET JOB#: [id number]  cc: Aaron Mose. Tyrone Balash, MD, <Dictator> Everlie Eble Woodfin Ganja MD ELECTRONICALLY SIGNED 06/07/2013 21:57

## 2015-01-02 NOTE — H&P (Signed)
Subjective/Chief Complaint 3 weeks abdominal pain   History of Present Illness 81 y/o female with hypertension presents to ER with 3 weeks of intermittent crampy LLQ abdominal pain.  She was seen by Her PCP 2 weeks ago and placed on oral cipro for presumed UTI.  She has felt no better.  Imaging last week with US showed normal gallbladder and a complex cystic structure in LLQ.  Follow up CT scan ordered through PCP office showed a contained perforation sigmoid diverticulitis.  Referred to ER by radiologist who read study.  She denies fevers, no anorexia, no change in bowel habits, no nausea or emesis. no urinary symptoms.   Past History htn TIA spinal stenosis renal artery stenosis   Past Medical Health Hypertension   Primary Physician Rosanna Randy, MD   Past Med/Surgical Hx:  Allergic Rhinitis:   spinal stenosis:   GERD - Esophageal Reflux:   HTN:   right side renal stent:   Back Surgery:   Carpal Tunnel Release:   ALLERGIES:  Quinine: Other  Codeine: Unknown  Celebrex: Unknown  HOME MEDICATIONS: Medication Instructions Status  Levaquin 500 mg oral tablet 1 tab(s) orally every 24 hours Active  benzonatate 100 mg oral capsule 1 cap(s) orally every 6 hours, As Needed, cough , As needed, cough Active  pantoprazole 40 mg oral delayed release tablet 1 tab(s) orally  Active  labetalol 200 mg oral tablet 2  orally 2 times a day  Active  Allegra 180 mg oral tablet 1  orally once a day  Active  Ascriptin Enteric 81 mg tablet 1  orally once a day  Active  Calcium 600+D   once a day  Active  biotin 5 mg oral capsule 1  orally once a day  Active  Tribenzor 40 mg-10 mg-25 mg oral tablet 1 tab(s) orally once a day Active  Zantac 150 oral tablet 1 tab(s) orally 2 times a day Active   Family and Social History:  Family History Non-Contributory   Social History negative tobacco, negative ETOH, negative Illicit drugs   Place of Living Home   Review of Systems:  Subjective/Chief Complaint  see above   Fever/Chills No   Cough No   Sputum No   Abdominal Pain Yes   Diarrhea No   Constipation No   Nausea/Vomiting No   SOB/DOE No   Chest Pain No   Telemetry Reviewed NSR   Dysuria No   Tolerating Diet Yes   Medications/Allergies Reviewed Medications/Allergies reviewed   Physical Exam:  GEN no acute distress, obese, temp 98.1 p 76 bp 188/67   HEENT pale conjunctivae, PERRL, hearing intact to voice   NECK supple   RESP normal resp effort  clear BS   CARD regular rate  no murmur  no thrills  no JVD   ABD positive tenderness  denies Flank Tenderness  no hernia  normal BS  LLQ pain mild to deep palpation   LYMPH negative neck   EXTR negative cyanosis/clubbing   SKIN normal to palpation, No rashes   NEURO cranial nerves intact   PSYCH A+O to time, place, person, good insight   Lab Results: Hepatic:  02-Feb-15 20:57   Bilirubin, Total 0.5  Alkaline Phosphatase 96 (45-117 NOTE: New Reference Range 08/01/13)  SGPT (ALT) 16  SGOT (AST) 25  Total Protein, Serum 7.1  Albumin, Serum  3.3  Routine Chem:  02-Feb-15 20:57   Glucose, Serum  170  BUN  19  Creatinine (comp)  1.38  Sodium, Serum  125  Potassium, Serum  3.3  Chloride, Serum  93  CO2, Serum 26  Calcium (Total), Serum 9.0  Osmolality (calc) 258  eGFR (African American)  43  eGFR (Non-African American)  37 (eGFR values <65m/min/1.73 m2 may be an indication of chronic kidney disease (CKD). Calculated eGFR is useful in patients with stable renal function. The eGFR calculation will not be reliable in acutelyill patients when serum creatinine is changing rapidly. It is not useful in  patients on dialysis. The eGFR calculation may not be applicable to patients at the low and high extremes of body sizes, pregnant women, and vegetarians.)  Result Comment potassium/ast - Slight hemolysis, interpret results with  - caution.  Result(s) reported on 13 Oct 2013 at 09:12PM.  Anion Gap  6   Lipase 109 (Result(s) reported on 13 Oct 2013 at 09:37PM.)  Routine UA:  02-Feb-15 20:57   Color (UA) Yellow  Clarity (UA) Clear  Glucose (UA) Negative  Bilirubin (UA) Negative  Ketones (UA) Negative  Specific Gravity (UA) 1.030  Blood (UA) 2+  pH (UA) 5.0  Protein (UA) 100 mg/dL  Nitrite (UA) Negative  Leukocyte Esterase (UA) Negative (Result(s) reported on 13 Oct 2013 at 09:47PM.)  RBC (UA) 54 /HPF  WBC (UA) 1 /HPF  Bacteria (UA) NONE SEEN  Epithelial Cells (UA) <1 /HPF (Result(s) reported on 13 Oct 2013 at 09:47PM.)  Routine Hem:  02-Feb-15 20:57   WBC (CBC)  11.5  RBC (CBC) 4.14  Hemoglobin (CBC) 12.4  Hematocrit (CBC) 36.4  Platelet Count (CBC) 331  MCV 88  MCH 29.9  MCHC 34.1  RDW 12.8  Neutrophil % 78.3  Lymphocyte % 11.3  Monocyte % 7.7  Eosinophil % 1.7  Basophil % 1.0  Neutrophil #  9.0  Lymphocyte # 1.3  Monocyte # 0.9  Eosinophil # 0.2  Basophil # 0.1 (Result(s) reported on 13 Oct 2013 at 09:12PM.)   Radiology Results: [place]    29-Jan-15 11:05, [place]  [place]  REASON FOR EXAM:    abd pain generalized   pelvic pain  COMMENTS:       PROCEDURE: [place] - [place] GENERAL SURVEY  - Oct 09 2013 11:05AM     CLINICAL DATA:  Abdominal pain.    EXAM:  ULTRASOUND ABDOMEN COMPLETE    COMPARISON:  None.    FINDINGS:  Gallbladder:  The gallbladder wall appears thickened measuring up to 3.6 mm.  Multiple echogenic foci are identified within the wall gallbladder  wall which may indicate cholesterol crystals or small adherent  stones. No pericholecystic fluid or sonographic Murphy's sign.    Common bile duct:    Diameter: 5.7 mm    Liver:    No focal lesion identified. Within normal limits in parenchymal  echogenicity.    IVC:  No abnormality visualized.    Pancreas:    Visualized portion unremarkable.    Spleen:    Size and appearance within normal limits.    Right Kidney:    Length: 7 cm.. Atrophic  and echogenic compatible with chronic  medical renal disease.  Left Kidney:    Length: 11.7 cm. There are. Echogenicity within normal limits. No  mass or hydronephrosis visualized.    Abdominal aorta:    No aneurysm visualized.    Other findings:    None.     IMPRESSION:  1. Gallbladder wall thickening. If there is a high clinical concern  for acute cholecystitis and further imaging is clinically  indicated  thena hepatobiliary scan may be helpful to assess the patency of  the cystic duct.  2. Atrophic and echogenic right kidney.      Electronically Signed    By: Kerby Moors M.D.    On: 10/09/2013 11:11         Verified By: Angelita Ingles, M.D.,    29-Jan-15 11:05, US Pelvis Ultrasound Exam with Transvaginal - NON-OB  US Pelvis Ultrasound Exam with Transvaginal - NON-OB  REASON FOR EXAM:    abd pain generalized   pelvic pain  COMMENTS:       PROCEDURE: [place]  - US PELVIS EXAM W/TRANSVAGINAL  - Oct 09 2013 11:05AM     CLINICAL DATA:  Generalized pelvic pain    EXAM:  TRANSABDOMINAL AND TRANSVAGINAL ULTRASOUND OF PELVIS    TECHNIQUE:  Both transabdominal and transvaginal ultrasound examinations of the  pelvis were performed. Transabdominal technique was performed for  global imaging of the pelvis including uterus, ovaries, adnexal  regions, and pelvic cul-de-sac. It was necessary to proceed with  endovaginal exam following the transabdominal exam to visualize the  uterus, endometrium and ovaries.    COMPARISON:  None    FINDINGS:  Uterus    Measurements: 5.3 x 3.0 x 2.8 cm. No fibroids or other mass  visualized.    Endometrium    Thickness: 2.8 mm..  No focal abnormality visualized.  Right ovary    Measurements: The right ovary is atrophic and measures 1.4 x 1.1 x  1.6 cm..    Left ovary    Measurements: Not visualized..    Other findings    Within the left adnexa there is an indeterminate solid and cystic  structure containing internal blood  flow. This measures 3.6 x 2.5 x  4.5 cm.   IMPRESSION:  1. Indeterminate complex cystic and solid structure in the left  adnexa. Recommend further assessment with contrast enhanced CT of  the abdomen and pelvis with oral contrast material.      Electronically Signed    By: Kerby Moors M.D.    On: 10/09/2013 11:17         Verified By: Angelita Ingles, M.D.,  LabUnknown:    22-Jan-15 15:36, KDKUB  KDKUB  REASON FOR EXAM:    abd pain  COMMENTS:       PROCEDURE: KDR - KDXR KIDNEY URETER BLADDER  - Oct 02 2013  3:36PM     CLINICAL DATA:  Abdominal pain.    EXAM:  ABDOMEN - 1 VIEW    COMPARISON:  Abdominal MRI dated 08/11/2010.    FINDINGS:  The bowel gas pattern is normal. There are no worrisome abdominal  calcifications. Air is seen in normal appearing appendix.  There are old compression fractures of T12 and L1. Previous  vertebroplasty at L1.     IMPRESSION:  No acute abnormality of the abdomen or pelvis.      Electronically Signed    By: Rozetta Nunnery M.D.    On: 10/02/2013 16:50         Verified By: Larey Seat, M.D.,  PACS Image    [phone] 11:50, CT Abdomen and Pelvis With Contrast  PACS Image  CT:  CT Abdomen and Pelvis With Contrast  REASON FOR EXAM:    LABS 1ST Evaluate complex cystic and solid structures   L adnexa as seen on fo...  COMMENTS:       PROCEDURE: KCT - KCT ABDOMEN/PELVIS W  - Oct 13 2013 11:50AM     ADDENDUM REPORT: 10/13/2013 20:05    ADDENDUM:  I attempted to reach Dr. Rosanna Randy, but the office was closed. The  answering service for the office attempted to arrange for me to talk  to the on call physician, but this was unsuccessful. As such, I  called the patient directly at home. I spoke with Ms. Soule first  at approximately 1946 hrs and gave her the results of this CT scan  and told her that she had a diverticular abscess. She did inform me  that she had completed a course of Cipro about a week ago but was  having  continued pain and discomfort in the pelvis. I then called  the emergency department at that [facility]  and talked to the charge nurse, Marliss Czar, and she was able to view the  CT report. I then called Ms. Diffee back at 1958 hrs let her know  that the [facility] ED would readyfor her arrival. I explained to her the  importance of going to the ED so that they could assess for sepsis.  I asked her to proceed to the ED and she said that the [facility] was the  closest ED to her. She stated that she would be able to obtain a  ride to the emergency room and felt like she could be there in about  an 1 hour.      Electronically Signed    By: Misty Stanley M.D.    On: 10/13/2013 20:05    CLINICAL DATA:  Bilateral lower quadrant pain. Superior  constipation.    EXAM:  CT ABDOMEN ANDPELVIS WITH CONTRAST    TECHNIQUE:  Multidetector CT imaging of the abdomen and pelvis was performed  using the standard protocol following bolus administration of  intravenous contrast.    CONTRAST:  60 cc Isovue 300.  COMPARISON:  No comparison studies available. [name]    FINDINGS:  No focal abnormality is seen in the liver or spleen. There is a tiny  hiatal hernia. Stomach is otherwise unremarkable. The duodenum,  pancreas, gallbladder, and right adrenal gland are unremarkable. 1.5  cm left adrenal nodule has washout characteristics that preclude  classification as benign adrenal adenoma by CT. The right kidney is  atrophic with a 6 mm hypo attenuating lesion in the interpolar  region, likely a cyst. The left kidney is unremarkable.    Dense atherosclerotic calcification is noted in the wall of the  abdominal aorta without aneurysm. There is no lymphadenopathy in the  abdomen. No free fluid in the abdomen.  Imaging through the pelvis shows no free intraperitoneal fluid. No  pelvic sidewalllymphadenopathy.    There is diverticular disease in the sigmoid colon with asymmetric  wall thickening. A  4.4 x 3.6 cm collection of fluid and gas is  identified adjacent to the abnormal segment of sigmoid colon,  consistent with pericolonic abscess.    Uterus is unremarkable. No adnexal mass. The terminal ileum is  normal. The appendix is normal.    Bone windows reveal no worrisome lytic or sclerotic osseous lesions.  Patient is status post vertebral augmentation at L1. Superior  endplate compression fracture at T12 results in about 50% loss of  height anteriorly. There is superior endplate depression at L5 as  well resulting in about 25% loss of height     IMPRESSION:  Sigmoid diverticulitis with 4.4 cm pericolonic diverticular abscess.    Electronically Signed:  By: Verda Cumins.D.  On: 10/13/2013 19:28         Verified By: ERIC A. MANSELL, M.D.,    Assessment/Admission Diagnosis 81 y/o female with perforated sigmoid diverticulitis, no acute surgical indication at present. hyponatremia hyperglycemia hypertension   Plan admit, IV clinda/rochepin. NS with KCL supplementation SSI continue her antihypertensives   Electronic Signatures: Sherri Rad (MD)  (Signed 03-Feb-15 00:50)  Authored: CHIEF COMPLAINT and HISTORY, PAST MEDICAL/SURGIAL HISTORY, ALLERGIES, HOME MEDICATIONS, FAMILY AND SOCIAL HISTORY, REVIEW OF SYSTEMS, PHYSICAL EXAM, LABS, Radiology, ASSESSMENT AND PLAN   Last Updated: 03-Feb-15 00:50 by Sherri Rad (MD)

## 2015-01-02 NOTE — Discharge Summary (Signed)
PATIENT NAME:  Dawn [name], Dawn [name] MR#:  [id number] DATE OF BIRTH:  1933-10-04  DATE OF ADMISSION:  12/19/2013 DATE OF DISCHARGE:  12/25/2013  HISTORY: This is a 81 year old female who was recently diagnosed with evidence of a small contained diverticular abscess. She had been treated with antibiotics and followed for several weeks now showing persistence of an abscess pocket containing a little bit more air and now with suspicion of connection to the urinary bladder. The patient also voicing that she has noted a little bit of air when she passes urine. Because of this, surgical resection was recommended. The patient was in generally good health otherwise. She had a previous history of hypertension, which is under control. No other documented major medical or surgical illnesses.   COURSE IN HOSPITAL: The patient was taken to the operating room on 04/10 and underwent laparoscopy-assisted sigmoid colon resection with an area of involvement that appeared to be adherent to the urinary bladder, but did not show absolute evidence of a leak from the bladder. This was tested at the time of surgery by instillation of milk into the bladder. Her postoperative course was basically uncomplicated. She had no significant urinary symptoms. She was able to tolerate oral intake after 48 hours and gradually advanced to a diet, which she tolerated well. The patient did have a low potassium postoperatively, which required correction, and was noted to be on the rise back towards normal at the time of discharge. She was maintained on some additional potassium supplement and will be followed as an outpatient.   FINAL DIAGNOSIS:  Sigmoid diverticulitis with abscess formation and colovesical fistula.   OPERATION PERFORMED:  Laparoscopy and sigmoid colon resection.   ADDITIONAL DIAGNOSIS:  History of hypertension.   ____________________________ S.Robinette Haines, MD sgs:dmm D: 01/16/2014 10:29:22 ET T: 01/16/2014 11:12:11  ET JOB#: [id number]  cc: Synthia Innocent. Jamal Collin, MD, <Dictator> [facility] Robinette Haines MD ELECTRONICALLY SIGNED 01/18/2014 10:07

## 2015-01-02 NOTE — Discharge Summary (Signed)
PATIENT NAME:  Dawn [name], SPURLOCK MR#:  [id number] DATE OF BIRTH:  27-Jul-1934  DATE OF ADMISSION:  10/14/2013 DATE OF DISCHARGE:  10/16/2013  HISTORY OF PRESENT ILLNESS: This is a 81 year old female who was admitted via the Emergency Room with a 3-week history of intermittent left lower quadrant abdominal pain. She was initially treated with oral antibiotics with a suspicion for diverticulitis. However, she failed to respond, and a CT scan was then performed which showed evidence of a small abscess associated with the sigmoid region. There was no free perforation identified. Because of this, it was felt that she would benefit with in-hospital management for a couple of days.   PAST MEDICAL HISTORY: Included history of some gastroesophageal reflux, which has been under control. Has had a renal stent placed and has had back surgery and carpal tunnel surgery. History of spinal stenosis and hypertension.   HOME MEDICATIONS: Included Protonix, labetalol, Ascriptin, Tribenzor, Zantac p.r.n. and more recently Levaquin.   PHYSICAL EXAMINATION: Remarkable for focal tenderness in the left lower quadrant area with some voluntary guarding at that site. Bowel sounds were noted to be active. The rest of the abdominal exam was unremarkable.   LABORATORY DATA: Showed a white count of 11,500. Initial chemistries also showed a low sodium of 125, potassium 3.3, low chloride and a creatinine of 1.38. Evaluation of this suggested that likely she was mildly dehydrated.   COURSE IN HOSPITAL: The patient was placed on IV antibiotics and then monitored, and the next day, the patient had notable improvement, and over the next 48 hours, she showed significant improvement with only scant amount of tenderness in the left lower quadrant area. With the patient under good control with the use of IV antibiotics, it was decided at that time to discharge and continue oral agent. Her lab data had improved, with the chemistries all  returning towards normal. White count was down to 8300. The patient was discharged home on Augmentin and will be followed closely as an outpatient. A followup CT will be obtained in a week or so to ensure that the abscess is resolving.   FINAL DIAGNOSIS: Sigmoid diverticulitis with a small abscess.   OPERATION PERFORMED: None.  ADDITIONAL DIAGNOSES: History of hypertension, history of back surgery, history of renal stent and a history of gastroesophageal reflux.  ____________________________ S.Robinette Haines, MD sgs:lb D: 10/31/2013 11:03:46 ET T: 10/31/2013 11:33:29 ET JOB#: [id number]  cc: S.G. Jamal Collin, MD, <Dictator> [facility] Robinette Haines MD ELECTRONICALLY SIGNED 11/08/2013 9:58

## 2015-01-02 NOTE — Op Note (Signed)
PATIENT NAME:  Dawn [name], Dawn [name] MR#:  [id number] DATE OF BIRTH:  1933-10-28  DATE OF PROCEDURE:  12/20/2013  PREOPERATIVE DIAGNOSES: Sigmoid diverticulitis with abscess and suspected colovesical fistula.   POSTOPERATIVE DIAGNOSES: Sigmoid diverticulitis with abscess and suspected colovesical fistula.    OPERATION PERFORMED: Laparoscopy, sigmoid resection with reanastomosing.   SURGEON: S.G. Jamal Collin, M.D.   ASSISTANT:  Dr. Tollie Pizza.   ANESTHESIA:  General.   COMPLICATIONS: None.   ESTIMATED BLOOD LOSS: 25 mL.   DRAINS: None.    DESCRIPTION OF PROCEDURE: The patient was put to sleep in the supine position on the operating table. She was then placed on adjustable stirrups. The abdomen and the rectal area were prepped and draped out of sterile field. Timeout procedure was performed. Foley catheter was inserted prior to the prep. A small stab incision was made in the epigastric region and a Veress needle was introduced in the peritoneal cavity, verified with the hanging drop method. Pneumoperitoneum was obtained and an 11 mm port was placed. The camera was introduced after pneumoperitoneum was obtained with good visualization. Additional 11 mm port in the right side midportion, and at the 5 mm port in the lower right aspect was done under direct vision With the patient in Trendelenburg, the omentum was pushed up towards the upper abdomen and the bowel wall  also displaced to expose the sigmoid colon. Using a Harmonic scalpel, the lateral aspect sigmoid was free from the lateral pelvic wall and carefully carried up along the left gutter part of the way up towards the left upper quadrant. Dissection downward was then performed to expose the focal area of adhesion between the colon and the urinary bladder. This was a dense adhesion but appeared to have a plane of dissection and using gentle traction this was subsequently freed completely. The lower part of the sigmoid and the upper rectum appeared  perfectly normal. The connection between the sigmoid colon with a documented abscess and connection to the bladder was completely freed. The area on the bladder wall was a thickened and appeared to be mostly inflammatory thickening. No apparent urinary extravasation was noted. With the bowel being adequately mobilized, it was felt ready to open for resection and reanastomosis. The ports were removed and pneumoperitoneum was released. A small vertical incision was made in the lower abdomen and carefully deepened through the layers into the peritoneal cavity. Bleeding controlled with cautery. A protective retractor was then placed in the abdominal wall. Sigmoid colon was brought up to the field and the area of concern, which was about 3-4 cm in length, was inspected. There was no active process of infection identified. Segmental resection was then planned and the mesentery was scored and taken down with the use of the Harmonic scalpel. The bowel was then transected the proximal and distal end and the segment of involved bowel removed and sent to pathology. Anastomosis was then performed and in end-to-end fashion 3-0 silk was placed in this seromuscular layer posteriorly. The mucosa and submucosa were then closed with a running 3-0 Vicryl and the anterior aspect reinforced with additional 3-0 silk stitches. Anastomosis was noted to be intact. At this point, the testing was done to ensure no leak from the anastomosis and no leak from the bladder. Initially the area was filled with some saline and the proximal clamp was placed and with the sigmoidoscope in the rectum, air was instilled with distention of the area of the anastomosis and no air leak was identified. Following this, the fluid  was suctioned out and the area of the bladder was inspected where the connection existed between the colon and the urinary bladder. The bladder was then filled with milk and distended and with no evidence of a leak noted at the  suspected fistula site. The bladder was then reconnected back the Foley catheter. After rescrubbing and changing gloves and gown the abdominal wound area was draped off again with towels and closure was obtained. The omentum was placed back in its position. The peritoneal layer was closed with a running 2-0 Vicryl and the fascia was closed with interrupted figure-of-eight stitches of 0 Prolene. The wound was irrigated in layers. The subcutaneous tissue was closed with a running 2-0 Vicryl. The skin incision and the port site incisions were closed with subcuticular 3-0 Monocryl reinforced with Dermabond. The procedure was well tolerated. The patient subsequently was extubated and returned to the recovery room in stable condition.     ____________________________ S.Robinette Haines, MD sgs:tc D: 12/20/2013 09:19:04 ET T: 12/20/2013 12:53:57 ET JOB#: [id number]  cc: Synthia Innocent. Jamal Collin, MD, <Dictator> [facility] Robinette Haines MD ELECTRONICALLY SIGNED 12/21/2013 10:43

## 2015-01-03 NOTE — Op Note (Signed)
PATIENT NAME:  AMARACHI, KOTZ MR#:  [id number] DATE OF BIRTH:  1934-09-07  DATE OF PROCEDURE:  10/25/2011  PREOPERATIVE DIAGNOSIS:  Senile cataract right eye.  POSTOPERATIVE DIAGNOSIS:  Senile cataract right eye.  PROCEDURE:  Phacoemulsification with posterior chamber intraocular lens implantation of the right eye.  LENS:  ZCB00 18.5-diopter posterior chamber intraocular lens.  ULTRASOUND TIME:  19% of 1 minute, 16 seconds, CDE 15.6.  SURGEON:  Mali Reba Hulett, MD  ANESTHESIA:  Topical with tetracaine drops and 2% Xylocaine jelly.  COMPLICATIONS:  None.  DESCRIPTION OF PROCEDURE:  The patient was identified in the holding room and transported to the operating room and placed in the supine position under the operating microscope.  The right eye was identified as the operative eye and it was prepped and draped in the usual sterile ophthalmic fashion.  A 1 millimeter clear-corneal paracentesis was made at the 1:30 position.  The anterior chamber was filled with Viscoat viscoelastic.  A 2.4 millimeter keratome was used to make a near-clear corneal incision at the 10:30 position.  A curvilinear capsulorrhexis was made with a cystotome and capsulorrhexis forceps.  Balanced salt solution was used to hydrodissect and hydrodelineate the nucleus.  Phacoemulsification was then used in horizontal chopping fashion to remove the lens nucleus and epinucleus.  The remaining cortex was then removed using the irrigation and aspiration handpiece. Provisc was then placed into the capsular bag to distend it for lens placement.  A ZCB00 18.5-diopter lens was then injected into the capsular bag.  The remaining viscoelastic was aspirated.  Wounds were hydrated with balanced salt solution.  The anterior chamber was inflated to a physiologic pressure with balanced salt solution.  Miostat was placed into the anterior chamber to constrict the pupil. No wound leaks were noted.  Topical Vigamox drops and Maxitrol  ointment were applied to the eye. The patient was taken to the recovery room in stable condition without complications of anesthesia or surgery.  ____________________________ Wyonia Hough, MD crb:drc D: 10/25/2011 14:57:16 ET T: 10/25/2011 17:07:56 ET JOB#: [id number]  cc: Wyonia Hough, MD, <Dictator> Leandrew Koyanagi MD ELECTRONICALLY SIGNED 11/01/2011 13:07

## 2015-01-03 NOTE — Op Note (Signed)
PATIENT NAME:  Dawn [name], Dawn [name] MR#:  [id number] DATE OF BIRTH:  17-Mar-1934  DATE OF PROCEDURE:  11/22/2011  PREOPERATIVE DIAGNOSIS:  Senile cataract left eye.  POSTOPERATIVE DIAGNOSIS:  Senile cataract left eye.  PROCEDURE:  Phacoemulsification with posterior chamber intraocular lens implantation of the left eye.  LENS:  ZCB00 18.5-diopter posterior chamber intraocular lens.  ULTRASOUND TIME:  20% of 1 minute, 26 seconds, CDE 16.9.  SURGEON:  Wyonia Hough, MD  ANESTHESIA:  Topical with tetracaine drops and 2% Xylocaine jelly.  COMPLICATIONS:  None.  DESCRIPTION OF PROCEDURE:  The patient was identified in the holding room and transported to the operating room and placed in the supine position under the operating microscope.  The left eye was identified as the operative eye and it was prepped and draped in the usual sterile ophthalmic fashion.  A 1 millimeter clear-corneal paracentesis was made at the 1:30 position.  The anterior chamber was filled with Viscoat viscoelastic.  A 2.4 millimeter keratome was used to make a near-clear corneal incision at the 10:30 position.  A curvilinear capsulorrhexis was made with a cystotome and capsulorrhexis forceps.  Balanced salt solution was used to hydrodissect and hydrodelineate the nucleus.  Phacoemulsification was then used in horizontal chopping fashion to remove the lens nucleus and epinucleus.  The remaining cortex was then removed using the irrigation and aspiration handpiece. Provisc was then placed into the capsular bag to distend it for lens placement.  A ZCB00 18.5-diopter lens was then injected into the capsular bag.  The remaining viscoelastic was aspirated.  Wounds were hydrated with balanced salt solution.  The anterior chamber was inflated to a physiologic pressure with balanced salt solution.  Miostat was placed into the anterior chamber to constrict the pupil. No wound leaks were noted.  Topical Vigamox drops and Maxitrol  ointment were applied to the eye. The patient was taken to the recovery room in stable condition without complications of anesthesia or surgery.  ____________________________ Wyonia Hough, MD crb:drc D: 11/22/2011 14:06:26 ET T: 11/22/2011 14:47:25 ET JOB#: [id number]  cc: Wyonia Hough, MD, <Dictator> Leandrew Koyanagi MD ELECTRONICALLY SIGNED 11/29/2011 13:27

## 2015-01-05 DIAGNOSIS — K317 Polyp of stomach and duodenum: Secondary | ICD-10-CM | POA: Insufficient documentation

## 2015-01-05 DIAGNOSIS — G629 Polyneuropathy, unspecified: Secondary | ICD-10-CM | POA: Insufficient documentation

## 2015-01-05 DIAGNOSIS — E669 Obesity, unspecified: Secondary | ICD-10-CM | POA: Insufficient documentation

## 2015-01-05 DIAGNOSIS — I10 Essential (primary) hypertension: Secondary | ICD-10-CM | POA: Insufficient documentation

## 2015-01-05 DIAGNOSIS — R7303 Prediabetes: Secondary | ICD-10-CM | POA: Insufficient documentation

## 2015-01-05 DIAGNOSIS — J309 Allergic rhinitis, unspecified: Secondary | ICD-10-CM | POA: Insufficient documentation

## 2015-01-05 DIAGNOSIS — I6782 Cerebral ischemia: Secondary | ICD-10-CM | POA: Insufficient documentation

## 2015-01-05 DIAGNOSIS — G939 Disorder of brain, unspecified: Secondary | ICD-10-CM | POA: Insufficient documentation

## 2015-01-05 DIAGNOSIS — M1710 Unilateral primary osteoarthritis, unspecified knee: Secondary | ICD-10-CM | POA: Insufficient documentation

## 2015-01-05 DIAGNOSIS — M179 Osteoarthritis of knee, unspecified: Secondary | ICD-10-CM | POA: Insufficient documentation

## 2015-01-05 DIAGNOSIS — E7800 Pure hypercholesterolemia, unspecified: Secondary | ICD-10-CM | POA: Insufficient documentation

## 2015-01-05 DIAGNOSIS — I639 Cerebral infarction, unspecified: Secondary | ICD-10-CM | POA: Insufficient documentation

## 2015-01-05 DIAGNOSIS — M797 Fibromyalgia: Secondary | ICD-10-CM | POA: Insufficient documentation

## 2015-02-17 ENCOUNTER — Ambulatory Visit (INDEPENDENT_AMBULATORY_CARE_PROVIDER_SITE_OTHER): Payer: Medicare Other | Admitting: Family Medicine

## 2015-02-17 ENCOUNTER — Encounter: Payer: Self-pay | Admitting: Family Medicine

## 2015-02-17 VITALS — BP 142/66 | HR 64 | Temp 98.6°F | Resp 14 | Ht 61.0 in | Wt 202.0 lb

## 2015-02-17 DIAGNOSIS — Z0000 Encounter for general adult medical examination without abnormal findings: Secondary | ICD-10-CM

## 2015-02-17 NOTE — Progress Notes (Signed)
Patient ID: Dawn [name], female   DOB: 1933-10-27, 81 y.o.   MRN: [id number] Patient: Dawn [name], Female    DOB: 11/13/1933, 81 y.o.   MRN: [id number] Visit Date: 02/17/2015  Today's Provider: Wilhemena Durie, MD   Chief Complaint  Patient presents with  . Medicare Wellness   Subjective:    Annual wellness visit Dawn [name] is a 81 y.o. female who presents today for her Subsequent Annual Wellness Visit. She feels well. She reports exercising none. She reports she is sleeping fairly well. Pap smear-years ago. BMD-2005. Patient declined Tdap and Zoster in 2014.     Review of Systems  History   Social History  . Marital Status: Divorced    Spouse Name: none  . Number of Children: 1  . Years of Education: 13   Occupational History  . retired    Social History Main Topics  . Smoking status: Never Smoker   . Smokeless tobacco: Never Used  . Alcohol Use: No  . Drug Use: No  . Sexual Activity: No   Other Topics Concern  . Not on file   Social History Narrative    Patient Active Problem List   Diagnosis Date Noted  . Allergic rhinitis 01/05/2015  . Cerebral infarct 01/05/2015  . Accelerated essential hypertension 01/05/2015  . Fibrositis 01/05/2015  . Benign gastric polyp 01/05/2015  . Hypercholesteremia 01/05/2015  . Neuropathy 01/05/2015  . Adiposity 01/05/2015  . Arthritis of knee, degenerative 01/05/2015  . Borderline diabetes 01/05/2015  . Temporary cerebral vascular dysfunction 01/05/2015  . GERD (gastroesophageal reflux disease) 03/31/2013  . Gastric polyp     Past Surgical History  Procedure Laterality Date  . Upper gi endoscopy  2011  . Vascular surgery Right 2010    right rental artery   . Cardiac catheterization  2007    normal, [employer] [facility] [place]   . Colonoscopy  2011, 2015    Dr. Nicolasa Ducking, Dr Melody Comas  . Cataract surgery   2013  . Laminectomy  2011    decompression   . Back surgery  2011    vertebroplasty   .  Carpal tunnel release  2007  . Colon surgery  2015    lap sigmoid resection    Her family history includes Atrial fibrillation in her mother; Breast cancer in her paternal aunt, paternal aunt, paternal aunt, paternal aunt, and paternal grandmother; Cancer in her father; Colon polyps in her brother and sister; Diabetes in her brother and sister; Emphysema in her father; Heart disease in her brother, brother, and sister; Hypertension in her brother, brother, brother, mother, sister, and sister; Irritable bowel syndrome in her sister; Neuropathy in her brother; Post-traumatic stress disorder in her brother; Skin cancer in her sister; Thyroid disease in her sister.      Medication List       This list is accurate as of: 02/17/15  3:27 PM.  Always use your most recent med list.               aspirin 81 MG tablet  Take 81 mg by mouth daily.     Biotin 2500 MCG Caps  Take 5,000 mcg by mouth daily.     CALCIUM 600 + MINERALS 600-200 MG-UNIT Tabs  Take by mouth.     fexofenadine 180 MG tablet  Commonly known as:  ALLEGRA  Take 180 mg by mouth daily.     labetalol 200 MG tablet  Commonly known as:  NORMODYNE  Take  by mouth 2 (two) times daily.     omeprazole 20 MG capsule  Commonly known as:  PRILOSEC  Take by mouth.     ranitidine 150 MG tablet  Commonly known as:  ZANTAC  Take by mouth.     TRIBENZOR 40-10-25 MG Tabs  Generic drug:  Olmesartan-Amlodipine-HCTZ  Take by mouth.         Patient Care Team: Jerrol Banana., MD as PCP - General (Unknown Physician Specialty) Christene Lye, MD as Consulting Physician (General Surgery)     Objective:   Vitals: BP 142/66 mmHg  Pulse 64  Temp(Src) 98.6 F (37 C)  Resp 14  Ht 5\' 1"  (1.549 m)  Wt 202 lb (91.627 kg)  BMI 38.19 kg/m2  Physical Exam  Constitutional: She appears well-developed and well-nourished.  HENT:  Head: Normocephalic.  Right Ear: External ear normal.  Mouth/Throat: Oropharynx is clear  and moist.  Eyes: Conjunctivae are normal. Pupils are equal, round, and reactive to light.  Neck: Normal range of motion. Neck supple.  Cardiovascular: Normal rate, regular rhythm, normal heart sounds and intact distal pulses.   Pulmonary/Chest: Effort normal and breath sounds normal. She has no wheezes. She has no rales.  Abdominal: Soft. Bowel sounds are normal.  Musculoskeletal: Normal range of motion.  Neurological: She is alert.  Skin: Skin is warm and dry. No rash noted.  Psychiatric: She has a normal mood and affect. Her behavior is normal.    Activities of Daily Living In your present state of health, do you have any difficulty performing the following activities: 02/17/2015  Hearing? Y  Vision? N  Difficulty concentrating or making decisions? N  Walking or climbing stairs? Y  Dressing or bathing? N  Doing errands, shopping? N    Fall Risk Assessment Fall Risk  02/17/2015  Falls in the past year? No  Risk for fall due to : Impaired balance/gait     Depression Screen PHQ 2/9 Scores 02/17/2015  PHQ - 2 Score 0    Cognitive Testing - 6-CIT    Year: 0   Month: 0 points  Memorize "Pia Mau, [address] [place]"  Time (within 1 hour:) 0 points  Count backwars from 20: 0 points  Name months of year: 0 points  Repeat Address: 0 points   Total Score: 0  Interpretation : Normal (0-7) Abnormal (8-28)    Assessment & Plan:     Annual Wellness Visit  Reviewed patient's Family Medical History Reviewed and updated list of patient's medical providers Assessment of cognitive impairment was done Assessed patient's functional ability Established a written schedule for health screening [place] Completed and Reviewed  Exercise Activities and Dietary recommendations-patient advised to work on habits.  Prevnar 13 updated today.     Health Maintenance  Topic Date Due  . PNA vac Low Risk Adult (2 of 2 - PCV13) 08/14/2013  . INFLUENZA VACCINE   04/12/2015  . COLONOSCOPY  12/05/2022  .    [name] DEXA SCAN  Completed  .        Discussed health benefits of physical activity, and encouraged her to engage in regular exercise appropriate for her age and condition.   Chronic pain--patient feels that this causes chronic fatigue. This is mild  Hypertension Hyperlipidemia GERD Obesity Patient was seen and examined by Dr. Eulas Post and note was scribed by Theressa Millard, RMA.

## 2015-05-06 DIAGNOSIS — H6123 Impacted cerumen, bilateral: Secondary | ICD-10-CM | POA: Diagnosis not present

## 2015-05-06 DIAGNOSIS — H60331 Swimmer's ear, right ear: Secondary | ICD-10-CM | POA: Diagnosis not present

## 2015-06-01 DIAGNOSIS — L821 Other seborrheic keratosis: Secondary | ICD-10-CM | POA: Diagnosis not present

## 2015-06-01 DIAGNOSIS — R21 Rash and other nonspecific skin eruption: Secondary | ICD-10-CM | POA: Diagnosis not present

## 2015-06-01 DIAGNOSIS — D1801 Hemangioma of skin and subcutaneous tissue: Secondary | ICD-10-CM | POA: Diagnosis not present

## 2015-06-01 DIAGNOSIS — Q828 Other specified congenital malformations of skin: Secondary | ICD-10-CM | POA: Diagnosis not present

## 2015-06-01 DIAGNOSIS — D485 Neoplasm of uncertain behavior of skin: Secondary | ICD-10-CM | POA: Diagnosis not present

## 2015-06-16 ENCOUNTER — Encounter: Payer: Self-pay | Admitting: Family Medicine

## 2015-06-16 ENCOUNTER — Ambulatory Visit (INDEPENDENT_AMBULATORY_CARE_PROVIDER_SITE_OTHER): Payer: Medicare Other | Admitting: Family Medicine

## 2015-06-16 VITALS — BP 140/74 | HR 72 | Temp 98.4°F | Resp 12 | Wt 203.0 lb

## 2015-06-16 DIAGNOSIS — G939 Disorder of brain, unspecified: Secondary | ICD-10-CM

## 2015-06-16 DIAGNOSIS — I10 Essential (primary) hypertension: Secondary | ICD-10-CM

## 2015-06-16 DIAGNOSIS — K219 Gastro-esophageal reflux disease without esophagitis: Secondary | ICD-10-CM | POA: Diagnosis not present

## 2015-06-16 DIAGNOSIS — E7800 Pure hypercholesterolemia, unspecified: Secondary | ICD-10-CM | POA: Diagnosis not present

## 2015-06-16 DIAGNOSIS — R7303 Prediabetes: Secondary | ICD-10-CM

## 2015-06-16 DIAGNOSIS — E669 Obesity, unspecified: Secondary | ICD-10-CM | POA: Diagnosis not present

## 2015-06-16 DIAGNOSIS — I6782 Cerebral ischemia: Secondary | ICD-10-CM

## 2015-06-16 DIAGNOSIS — R739 Hyperglycemia, unspecified: Secondary | ICD-10-CM

## 2015-06-16 DIAGNOSIS — Z23 Encounter for immunization: Secondary | ICD-10-CM

## 2015-06-16 LAB — POCT GLYCOSYLATED HEMOGLOBIN (HGB A1C): HEMOGLOBIN A1C: 5.6

## 2015-06-16 NOTE — Progress Notes (Signed)
Patient ID: Dawn [name], female   DOB: [birth year], 81 y.o.   MRN: [id number]    Subjective:  Hypertension This is a chronic problem. The problem is unchanged. The problem is controlled. Associated symptoms include headaches (sometimes off and on and some days lasts an hour or more). Pertinent negatives include no malaise/fatigue, neck pain, shortness of breath or sweats. There are no associated agents to hypertension. Risk factors for coronary artery disease include dyslipidemia. There are no compliance problems.   Gastrophageal Reflux She complains of heartburn. This is a chronic problem. The problem occurs occasionally (when she eats foods like sausage. She does take Omeprazole and Zantac but does not control symptoms 100%). The symptoms are aggravated by certain foods.  Hyperglycemia Associated symptoms include headaches (sometimes off and on and some days lasts an hour or more) and weakness (in her left arm post stroke 2 years ago-mild). Pertinent negatives include no neck pain.   Lab Results  Component Value Date   HGBA1C 5.7 12/16/2014  patient does not check her sugars at home. She is not on any medications for this.    Prior to Admission medications   Medication Sig Start Date End Date Taking? Authorizing Provider  aspirin 81 MG tablet Take 81 mg by mouth daily.   Yes Historical Provider, MD  Biotin 2500 MCG CAPS Take 5,000 mcg by mouth daily.    Yes Historical Provider, MD  Calcium Carbonate-Vit D-Min (CALCIUM 600 + MINERALS) 600-200 MG-UNIT TABS Take by mouth.   Yes Historical Provider, MD  fexofenadine (ALLEGRA) 180 MG tablet Take 180 mg by mouth daily.   Yes Historical Provider, MD  hydrocortisone 2.5 % ointment APPLY TO AFFECTED AREAS TWICE A DAY AS NEEDED 06/01/15  Yes Historical Provider, MD  labetalol (NORMODYNE) 200 MG tablet Take by mouth 2 (two) times daily.  10/12/14  Yes Historical Provider, MD  Olmesartan-Amlodipine-HCTZ (TRIBENZOR) 40-10-25 MG TABS Take by mouth.    Yes Historical Provider, MD  omeprazole (PRILOSEC) 20 MG capsule Take by mouth. 08/11/14  Yes Historical Provider, MD  ranitidine (ZANTAC) 150 MG tablet Take by mouth. 01/16/14  Yes Historical Provider, MD    Patient Active Problem List   Diagnosis Date Noted  . Allergic rhinitis 01/05/2015  . Cerebral infarct ([place]) 01/05/2015  . Accelerated essential hypertension 01/05/2015  . Benign gastric polyp 01/05/2015  . Hypercholesteremia 01/05/2015  . Neuropathy ([place]) 01/05/2015  . Adiposity 01/05/2015  . Arthritis of knee, degenerative 01/05/2015  . Borderline diabetes 01/05/2015  . Temporary cerebral vascular dysfunction 01/05/2015  . GERD (gastroesophageal reflux disease) 03/31/2013  . Gastric polyp     Past Medical History  Diagnosis Date  . Arthritis   . Hypertension   . Stomach ulcer   . Acid reflux   . Gastric polyp 2014    Social History   Social History  . Marital Status: Divorced    Spouse Name: none  . Number of Children: 1  . Years of Education: 13   Occupational History  . retired    Social History Main Topics  . Smoking status: Never Smoker   . Smokeless tobacco: Never Used  . Alcohol Use: No  . Drug Use: No  . Sexual Activity: No   Other Topics Concern  . Not on file   Social History Narrative    Allergies  Allergen Reactions  . Quinine Anaphylaxis  . Quinine Derivatives Swelling  . Celecoxib Rash  . Codeine Rash    Review of Systems  Constitutional: Negative  for malaise/fatigue.  Respiratory: Negative.  Negative for shortness of breath.   Cardiovascular: Negative.   Gastrointestinal: Positive for heartburn.  Musculoskeletal: Positive for back pain and joint pain. Negative for neck pain.  Neurological: Positive for dizziness (sometimes), tingling (in left arm sometimes-chronic), weakness (in her left arm post stroke 2 years ago-mild) and headaches (sometimes off and on and some days lasts an hour or more).    Immunization History    Administered Date(s) Administered  . Pneumococcal Conjugate-13 02/17/2015  . Pneumococcal Polysaccharide-23 08/14/2012   Objective:  BP 140/74 mmHg  Pulse 72  Temp(Src) 98.4 F (36.9 C)  Resp 12  Wt 203 lb (92.08 kg)  Physical Exam  Lab Results  Component Value Date   WBC 8.2 12/22/2013   HGB 12.0 12/22/2013   HCT 36.6 12/22/2013   PLT 245 12/16/2014   GLUCOSE 119* 12/20/2013   CHOL 152 08/28/2013   TRIG 110 08/28/2013   HDL 68 08/28/2013   LDLCALC 62 08/28/2013   TSH 2.08 12/16/2014   HGBA1C 5.7 12/16/2014    CMP     Component Value Date/Time   NA 134* 12/20/2013 0910   NA 137 12/10/2013 1142   K 3.4* 12/24/2013 0435   K 4.5 12/10/2013 1142   CL 102 12/20/2013 0910   CL 95* 12/10/2013 1142   CO2 26 12/20/2013 0910   CO2 21 12/10/2013 1142   GLUCOSE 119* 12/20/2013 0910   GLUCOSE 100* 12/10/2013 1142   BUN 28* 12/16/2014   BUN 12 12/20/2013 0910   CREATININE 1.5* 12/16/2014   CREATININE 0.91 12/24/2013 0435   CREATININE 1.39* 12/10/2013 1142   CALCIUM 7.9* 12/20/2013 0910   CALCIUM 9.9 12/10/2013 1142   PROT 7.1 10/13/2013 2057   ALBUMIN 3.3* 10/13/2013 2057   AST 25 10/13/2013 2057   ALT 16 10/13/2013 2057   ALKPHOS 96 10/13/2013 2057   BILITOT 0.5 10/13/2013 2057   GFRNONAA >60 12/24/2013 0435   GFRNONAA 37* 12/10/2013 1142   GFRAA >60 12/24/2013 0435   GFRAA 42* 12/10/2013 1142    Assessment and Plan :  1. Borderline diabetes A1C is 5.6 today.  2. Gastroesophageal reflux disease, esophagitis presence not specified   3. Accelerated essential hypertension Controlled.  4. cerebral vascular dysfunction CVA 2 years ago with mild left sided weakness.  5. Hypercholesteremia   6. Adiposity   7. Hyperglycemia  - POCT HgB A1C  8. Need for influenza vaccination  - Flu vaccine HIGH DOSE PF (Fluzone High dose)  I have done the exam and reviewed the above chart and it is accurate to the best of my knowledge.  Miguel Aschoff  MD [facility] 06/16/2015 2:49 PM

## 2015-07-30 ENCOUNTER — Emergency Department: Payer: Medicare Other

## 2015-07-30 ENCOUNTER — Encounter: Payer: Self-pay | Admitting: *Deleted

## 2015-07-30 ENCOUNTER — Observation Stay: Payer: Medicare Other

## 2015-07-30 ENCOUNTER — Observation Stay
Admission: EM | Admit: 2015-07-30 | Discharge: 2015-07-31 | Disposition: A | Discharge status: Home / Self Care | Payer: Medicare Other | Attending: Specialist | Admitting: Specialist

## 2015-07-30 DIAGNOSIS — Z8719 Personal history of other diseases of the digestive system: Secondary | ICD-10-CM | POA: Diagnosis not present

## 2015-07-30 DIAGNOSIS — R930 Abnormal findings on diagnostic imaging of skull and head, not elsewhere classified: Secondary | ICD-10-CM

## 2015-07-30 DIAGNOSIS — I6523 Occlusion and stenosis of bilateral carotid arteries: Secondary | ICD-10-CM | POA: Diagnosis not present

## 2015-07-30 DIAGNOSIS — R0602 Shortness of breath: Secondary | ICD-10-CM | POA: Insufficient documentation

## 2015-07-30 DIAGNOSIS — Z9889 Other specified postprocedural states: Secondary | ICD-10-CM | POA: Insufficient documentation

## 2015-07-30 DIAGNOSIS — R200 Anesthesia of skin: Secondary | ICD-10-CM | POA: Insufficient documentation

## 2015-07-30 DIAGNOSIS — M1990 Unspecified osteoarthritis, unspecified site: Secondary | ICD-10-CM | POA: Diagnosis not present

## 2015-07-30 DIAGNOSIS — R208 Other disturbances of skin sensation: Secondary | ICD-10-CM | POA: Diagnosis present

## 2015-07-30 DIAGNOSIS — Z818 Family history of other mental and behavioral disorders: Secondary | ICD-10-CM | POA: Diagnosis not present

## 2015-07-30 DIAGNOSIS — Z961 Presence of intraocular lens: Secondary | ICD-10-CM | POA: Diagnosis not present

## 2015-07-30 DIAGNOSIS — Z820 Family history of epilepsy and other diseases of the nervous system: Secondary | ICD-10-CM | POA: Diagnosis not present

## 2015-07-30 DIAGNOSIS — E876 Hypokalemia: Secondary | ICD-10-CM | POA: Diagnosis not present

## 2015-07-30 DIAGNOSIS — Z8673 Personal history of transient ischemic attack (TIA), and cerebral infarction without residual deficits: Secondary | ICD-10-CM | POA: Insufficient documentation

## 2015-07-30 DIAGNOSIS — R55 Syncope and collapse: Secondary | ICD-10-CM | POA: Diagnosis present

## 2015-07-30 DIAGNOSIS — Z8349 Family history of other endocrine, nutritional and metabolic diseases: Secondary | ICD-10-CM | POA: Insufficient documentation

## 2015-07-30 DIAGNOSIS — Z7982 Long term (current) use of aspirin: Secondary | ICD-10-CM | POA: Diagnosis not present

## 2015-07-30 DIAGNOSIS — Z881 Allergy status to other antibiotic agents status: Secondary | ICD-10-CM | POA: Insufficient documentation

## 2015-07-30 DIAGNOSIS — Z79899 Other long term (current) drug therapy: Secondary | ICD-10-CM | POA: Insufficient documentation

## 2015-07-30 DIAGNOSIS — I10 Essential (primary) hypertension: Secondary | ICD-10-CM | POA: Diagnosis not present

## 2015-07-30 DIAGNOSIS — E785 Hyperlipidemia, unspecified: Secondary | ICD-10-CM | POA: Diagnosis not present

## 2015-07-30 DIAGNOSIS — Z803 Family history of malignant neoplasm of breast: Secondary | ICD-10-CM | POA: Insufficient documentation

## 2015-07-30 DIAGNOSIS — Z885 Allergy status to narcotic agent status: Secondary | ICD-10-CM | POA: Diagnosis not present

## 2015-07-30 DIAGNOSIS — Z8711 Personal history of peptic ulcer disease: Secondary | ICD-10-CM | POA: Diagnosis not present

## 2015-07-30 DIAGNOSIS — Z800 Family history of malignant neoplasm of digestive organs: Secondary | ICD-10-CM | POA: Insufficient documentation

## 2015-07-30 DIAGNOSIS — R42 Dizziness and giddiness: Secondary | ICD-10-CM | POA: Diagnosis not present

## 2015-07-30 DIAGNOSIS — Z8249 Family history of ischemic heart disease and other diseases of the circulatory system: Secondary | ICD-10-CM | POA: Diagnosis not present

## 2015-07-30 DIAGNOSIS — K219 Gastro-esophageal reflux disease without esophagitis: Secondary | ICD-10-CM | POA: Insufficient documentation

## 2015-07-30 DIAGNOSIS — Z808 Family history of malignant neoplasm of other organs or systems: Secondary | ICD-10-CM | POA: Insufficient documentation

## 2015-07-30 DIAGNOSIS — I313 Pericardial effusion (noninflammatory): Secondary | ICD-10-CM | POA: Insufficient documentation

## 2015-07-30 DIAGNOSIS — Z833 Family history of diabetes mellitus: Secondary | ICD-10-CM | POA: Diagnosis not present

## 2015-07-30 DIAGNOSIS — R202 Paresthesia of skin: Secondary | ICD-10-CM | POA: Insufficient documentation

## 2015-07-30 DIAGNOSIS — Z888 Allergy status to other drugs, medicaments and biological substances status: Secondary | ICD-10-CM | POA: Insufficient documentation

## 2015-07-30 HISTORY — DX: Dorsalgia, unspecified: M54.9

## 2015-07-30 HISTORY — DX: Cerebral infarction, unspecified: I63.9

## 2015-07-30 LAB — COMPREHENSIVE METABOLIC PANEL
ALK PHOS: 85 U/L (ref 38–126)
ALT: 32 U/L (ref 14–54)
AST: 29 U/L (ref 15–41)
Albumin: 4.3 g/dL (ref 3.5–5.0)
Anion gap: 11 (ref 5–15)
BUN: 21 mg/dL — ABNORMAL HIGH (ref 6–20)
CALCIUM: 9.3 mg/dL (ref 8.9–10.3)
CO2: 24 mmol/L (ref 22–32)
CREATININE: 1.33 mg/dL — AB (ref 0.44–1.00)
Chloride: 99 mmol/L — ABNORMAL LOW (ref 101–111)
GFR, EST AFRICAN AMERICAN: 43 mL/min — AB (ref >60)
GFR, EST NON AFRICAN AMERICAN: 37 mL/min — AB (ref >60)
Glucose, Bld: 126 mg/dL — ABNORMAL HIGH (ref 65–99)
Potassium: 3.2 mmol/L — ABNORMAL LOW (ref 3.5–5.1)
Sodium: 134 mmol/L — ABNORMAL LOW (ref 135–145)
TOTAL PROTEIN: 7.7 g/dL (ref 6.5–8.1)
Total Bilirubin: 0.7 mg/dL (ref 0.3–1.2)

## 2015-07-30 LAB — DIFFERENTIAL
BASOS PCT: 0 %
Basophils Absolute: 0 10*3/uL (ref 0–0.1)
Eosinophils Absolute: 0.3 10*3/uL (ref 0–0.7)
Eosinophils Relative: 4 %
LYMPHS PCT: 17 %
Lymphs Abs: 1.5 10*3/uL (ref 1.0–3.6)
MONO ABS: 0.6 10*3/uL (ref 0.2–0.9)
MONOS PCT: 7 %
NEUTROS ABS: 6.1 10*3/uL (ref 1.4–6.5)
Neutrophils Relative %: 72 %

## 2015-07-30 LAB — CBC
HEMATOCRIT: 40.5 % (ref 35.0–47.0)
Hemoglobin: 13.9 g/dL (ref 12.0–16.0)
MCH: 30.4 pg (ref 26.0–34.0)
MCHC: 34.3 g/dL (ref 32.0–36.0)
MCV: 88.7 fL (ref 80.0–100.0)
Platelets: 229 10*3/uL (ref 150–440)
RBC: 4.57 MIL/uL (ref 3.80–5.20)
RDW: 13.4 % (ref 11.5–14.5)
WBC: 8.5 10*3/uL (ref 3.6–11.0)

## 2015-07-30 LAB — PROTIME-INR
INR: 0.95
Prothrombin Time: 12.9 seconds (ref 11.4–15.0)

## 2015-07-30 LAB — TROPONIN I

## 2015-07-30 LAB — APTT: aPTT: 31 seconds (ref 24–36)

## 2015-07-30 MED ORDER — PANTOPRAZOLE SODIUM 40 MG PO TBEC
40.0000 mg | DELAYED_RELEASE_TABLET | Freq: Every day | ORAL | Status: DC
  Administered 2015-07-31: 40 mg via ORAL
  Filled 2015-07-30: qty 1

## 2015-07-30 MED ORDER — OLMESARTAN MEDOXOMIL 40 MG PO TABS
40.0000 mg | ORAL_TABLET | Freq: Every day | ORAL | Status: DC
  Administered 2015-07-31: 40 mg via ORAL
  Filled 2015-07-30: qty 1

## 2015-07-30 MED ORDER — SODIUM CHLORIDE 0.9 % IJ SOLN
3.0000 mL | Freq: Two times a day (BID) | INTRAMUSCULAR | Status: DC
  Administered 2015-07-31: 3 mL via INTRAVENOUS

## 2015-07-30 MED ORDER — CALCIUM CARBONATE-VITAMIN D 500-200 MG-UNIT PO TABS
1.0000 | ORAL_TABLET | Freq: Every day | ORAL | Status: DC
  Administered 2015-07-31: 1 via ORAL
  Filled 2015-07-30: qty 1

## 2015-07-30 MED ORDER — ACETAMINOPHEN 325 MG PO TABS
650.0000 mg | ORAL_TABLET | Freq: Four times a day (QID) | ORAL | Status: DC | PRN
  Administered 2015-07-31: 650 mg via ORAL
  Filled 2015-07-30: qty 2

## 2015-07-30 MED ORDER — ENOXAPARIN SODIUM 40 MG/0.4ML ~~LOC~~ SOLN
40.0000 mg | SUBCUTANEOUS | Status: DC
  Administered 2015-07-31: 40 mg via SUBCUTANEOUS
  Filled 2015-07-30: qty 0.4

## 2015-07-30 MED ORDER — ACETAMINOPHEN 650 MG RE SUPP: 650.0000 mg | Freq: Four times a day (QID) | RECTAL | Status: DC | PRN

## 2015-07-30 MED ORDER — POTASSIUM CHLORIDE CRYS ER 20 MEQ PO TBCR
40.0000 meq | EXTENDED_RELEASE_TABLET | Freq: Once | ORAL | Status: AC
  Administered 2015-07-30: 40 meq via ORAL
  Filled 2015-07-30: qty 2

## 2015-07-30 MED ORDER — HYDROCHLOROTHIAZIDE 25 MG PO TABS
25.0000 mg | ORAL_TABLET | Freq: Every day | ORAL | Status: DC
  Administered 2015-07-31: 25 mg via ORAL
  Filled 2015-07-30: qty 1

## 2015-07-30 MED ORDER — SODIUM CHLORIDE 0.9 % IV SOLN
INTRAVENOUS | Status: DC
  Administered 2015-07-31: via INTRAVENOUS

## 2015-07-30 MED ORDER — ASPIRIN EC 81 MG PO TBEC
81.0000 mg | DELAYED_RELEASE_TABLET | Freq: Every day | ORAL | Status: DC
  Administered 2015-07-31: 81 mg via ORAL
  Filled 2015-07-30: qty 1

## 2015-07-30 MED ORDER — FAMOTIDINE 20 MG PO TABS
20.0000 mg | ORAL_TABLET | Freq: Every day | ORAL | Status: DC
  Administered 2015-07-31: 20 mg via ORAL
  Filled 2015-07-30: qty 1

## 2015-07-30 MED ORDER — LORATADINE 10 MG PO TABS
10.0000 mg | ORAL_TABLET | Freq: Every day | ORAL | Status: DC
  Administered 2015-07-31: 10 mg via ORAL
  Filled 2015-07-30: qty 1

## 2015-07-30 MED ORDER — OLMESARTAN-AMLODIPINE-HCTZ 40-10-25 MG PO TABS: 1.0000 | ORAL_TABLET | Freq: Every day | ORAL | Status: DC

## 2015-07-30 MED ORDER — LABETALOL HCL 200 MG PO TABS
200.0000 mg | ORAL_TABLET | Freq: Two times a day (BID) | ORAL | Status: DC
  Administered 2015-07-31 (×2): 200 mg via ORAL
  Filled 2015-07-30 (×2): qty 1

## 2015-07-30 MED ORDER — CALCIUM CARBONATE-VITAMIN D 600-200 MG-UNIT PO TABS: 1.0000 | ORAL_TABLET | Freq: Every day | ORAL | Status: DC

## 2015-07-30 MED ORDER — AMLODIPINE BESYLATE 10 MG PO TABS
10.0000 mg | ORAL_TABLET | Freq: Every day | ORAL | Status: DC
  Administered 2015-07-31: 10 mg via ORAL
  Filled 2015-07-30: qty 1

## 2015-07-30 NOTE — H&P (Signed)
[facility] NAME: Dawn [name]    MR#:  [id number]  DATE OF BIRTH:  1934-07-31  DATE OF ADMISSION:  07/30/2015  PRIMARY CARE PHYSICIAN: Wilhemena Durie, MD   REQUESTING/REFERRING PHYSICIAN: Lisa Roca  CHIEF COMPLAINT:   Chief Complaint  Patient presents with  . Code Stroke    HISTORY OF PRESENT ILLNESS:  Dawn [name]  is a 81 y.o. female with a known history of hypertension, CVA. She presents to the ER after passing out today. She ate a late breakfast. Did a grocery shopping. She went up 6 steps with her groceries and she felt dizzy doing so. She got into her house and ended up on the floor underneath her table. Nothing hurts from the fall. She couldn't get up and she slid to the phone and called family members. She had some worsening numbness on her left arm and tingling on her lip and tongue. With her stroke 2 years ago she does have residual left arm weakness and incoordination. Of note she's been short of breath for several months. In the ER she had a CT scan of the head which showed a subtle area in the left frontoparietal lobe which could not rule out a stroke. Telemetry neurology saw the patient and recommended an MRI of the brain.  PAST MEDICAL HISTORY:   Past Medical History  Diagnosis Date  . Arthritis   . Hypertension   . Stomach ulcer   . Acid reflux   . Gastric polyp 2014  . Stroke ([place])   . Back pain     PAST SURGICAL HISTORY:   Past Surgical History  Procedure Laterality Date  . Upper gi endoscopy  2011  . Vascular surgery Right 2010    right rental artery   . Cardiac catheterization  2007    [facility] [employer] [facility] [place]   . Colonoscopy  2011, 2015    Dr. Nicolasa Ducking, Dr Melody Comas  . Cataract surgery   2013  . Laminectomy  2011    decompression   . Back surgery  2011    vertebroplasty   . Carpal tunnel release  2007  . Colon surgery  2015    lap sigmoid resection    SOCIAL  HISTORY:   Social History  Substance Use Topics  . Smoking status: Never Smoker   . Smokeless tobacco: Never Used  . Alcohol Use: No    FAMILY HISTORY:   Family History  Problem Relation Age of Onset  . Cancer Father     stomach  . Emphysema Father   . Breast cancer Paternal Aunt   . Breast cancer Paternal Grandmother   . Breast cancer Paternal Aunt   . Breast cancer Paternal Aunt   . Breast cancer Paternal Aunt   . Hypertension Mother   . Atrial fibrillation Mother   . Diabetes Sister   . Hypertension Sister   . Skin cancer Sister   . Hypertension Brother   . Heart disease Brother   . Heart disease Sister   . Colon polyps Sister   . Hypertension Sister   . Thyroid disease Sister   . Irritable bowel syndrome Sister   . Heart disease Brother   . Colon polyps Brother   . Diabetes Brother   . Hypertension Brother   . Neuropathy Brother   . Post-traumatic stress disorder Brother   . Hypertension Brother     DRUG ALLERGIES:   Allergies  Allergen Reactions  .  Quinine Anaphylaxis and Swelling  . Celecoxib Rash  . Codeine Rash    REVIEW OF SYSTEMS:  CONSTITUTIONAL: No fever. Positive for fatigue. Positive for weight gain 20 pounds over the past year and a half. EYES: No blurred or double vision. Wears glasses. EARS, NOSE, AND THROAT: No tinnitus or ear pain. No sore throat. Decreased hearing right ear. Positive for runny nose. RESPIRATORY: No cough, wheezing or hemoptysis. Positive for shortness of breath with exertion. CARDIOVASCULAR: No chest pain, orthopnea, edema.  GASTROINTESTINAL: No nausea, vomiting, diarrhea or abdominal pain. No blood in bowel movements GENITOURINARY: No dysuria, hematuria.  ENDOCRINE: No polyuria, nocturia,  HEMATOLOGY: No anemia, easy bruising or bleeding SKIN: No rash or lesion. MUSCULOSKELETAL: Arthritis in her back fingers and knees   NEUROLOGIC: Left arm numbness and facial numbness  PSYCHIATRY: No anxiety or depression.    MEDICATIONS AT HOME:   Prior to Admission medications   Medication Sig Start Date End Date Taking? Authorizing Provider  acetaminophen (TYLENOL) 325 MG tablet Take 650 mg by mouth every 6 (six) hours as needed for mild pain.   Yes Historical Provider, MD  aspirin EC 81 MG tablet Take 81 mg by mouth daily.   Yes Historical Provider, MD  Biotin 5000 MCG CAPS Take 5,000 mcg by mouth daily.   Yes Historical Provider, MD  Calcium Carbonate-Vitamin D (CALCIUM 600+D) 600-200 MG-UNIT TABS Take 1 tablet by mouth daily.   Yes Historical Provider, MD  fexofenadine (ALLEGRA) 180 MG tablet Take 180 mg by mouth daily.   Yes Historical Provider, MD  labetalol (NORMODYNE) 200 MG tablet Take 200 mg by mouth 2 (two) times daily.   Yes Historical Provider, MD  Olmesartan-Amlodipine-HCTZ (TRIBENZOR) 40-10-25 MG TABS Take 1 tablet by mouth daily.   Yes Historical Provider, MD  omeprazole (PRILOSEC) 20 MG capsule Take 20 mg by mouth daily.   Yes Historical Provider, MD  ranitidine (ZANTAC) 150 MG tablet Take 150 mg by mouth at bedtime as needed for heartburn.   Yes Historical Provider, MD      VITAL SIGNS:  Blood pressure 198/81, pulse 94, temperature 97.7 F (36.5 C), temperature source Oral, resp. rate 22, height 5\' 1"  (1.549 m), weight 87.998 kg (194 lb), SpO2 94 %.  PHYSICAL EXAMINATION:  GENERAL:  81 y.o.-year-old patient lying in the bed with no acute distress.  EYES: Pupils equal, round, reactive to light and accommodation. No scleral icterus. Extraocular muscles intact.  HEENT: Head atraumatic, normocephalic. Oropharynx and nasopharynx clear.  NECK:  Supple, no jugular venous distention. No thyroid enlargement, no tenderness.  LUNGS: Normal breath sounds bilaterally, no wheezing, rales,rhonchi or crepitation. No use of accessory muscles of respiration.  CARDIOVASCULAR: S1, S2 normal. 2/6 systolic murmur, no rubs, or gallops.  ABDOMEN: Soft, nontender, nondistended. Bowel sounds present. No  organomegaly or mass.  EXTREMITIES: Trace pedal edema, no cyanosis, or clubbing.  NEUROLOGIC: Cranial nerves II through XII are intact. Muscle strength 5/5 in all extremities. Sensation intact. Gait not checked.  PSYCHIATRIC: The patient is alert and oriented x 3.  SKIN: No rash, lesion, or ulcer.   LABORATORY PANEL:   CBC  Recent Labs Lab 07/30/15 1902  WBC 8.5  HGB 13.9  HCT 40.5  PLT 229   ------------------------------------------------------------------------------------------------------------------  Chemistries   Recent Labs Lab 07/30/15 1902  NA 134*  K 3.2*  CL 99*  CO2 24  GLUCOSE 126*  BUN 21*  CREATININE 1.33*  CALCIUM 9.3  AST 29  ALT 32  ALKPHOS 85  BILITOT  0.7   ------------------------------------------------------------------------------------------------------------------  Cardiac Enzymes  Recent Labs Lab 07/30/15 1902  TROPONINI <0.03   ------------------------------------------------------------------------------------------------------------------  RADIOLOGY:  Ct Head Wo Contrast  07/30/2015  CLINICAL DATA:  Sudden onset of dizziness, status post fall. History of stroke. EXAM: CT HEAD WITHOUT CONTRAST TECHNIQUE: Contiguous axial images were obtained from the base of the skull through the vertex without intravenous contrast. COMPARISON:  MRI of the head dated 06/07/2013 FINDINGS: Brain: No evidence of acute hemorrhage, extra-axial collection, ventriculomegaly, or mass effect. There is subtle hypoattenuated appearance of the subcortical matter of the left frontoparietal lobe. Vascular: No hyperdense vessel. Mild vascular calcifications are noted at the skullbase. Skull: Negative for fracture or focal lesion. Sinuses/Orbits: No acute findings. Other: None. IMPRESSION: Subtle hypoattenuated appearance of the subcortical matter of the left frontoparietal lobe. Acute nonhemorrhagic infarction cannot be excluded. No evidence of hemorrhage or mass  effect. These results were called by telephone at the time of interpretation on 07/30/2015 at 6:59 pm to Dr. Harvest Dark , who verbally acknowledged these results. Electronically Signed   By: Fidela Salisbury M.D.   On: 07/30/2015 18:59    EKG:   Normal sinus rhythm 70 bpm.  IMPRESSION AND PLAN:   1. Syncope. This could be vasovagal with the patient eating breakfast and not eating lunch and doing activity. She felt dizzy going up the steps. I will monitor on telemetry. Check echocardiogram and carotid ultrasound. I will check orthostatic vital signs. I will give gentle IV fluid hydration overnight. 2. Abnormal CT scan of the head- this also does not fit with the patient has symptoms. Telemetry neurology recommended an MRI of the brain. 3. Shortness of breath with exertion- can consider an outpatient stress test. I'll check serial cardiac enzymes. 4. History of CVA- continue aspirin. Patient should also be on a statin but I will let Dr. Rosanna Randy decide on this. If the MRI of the brain shows a stroke will have to be on a statin and take a step up and treatment. 5. Gastroesophageal reflux disease without esophagitis- continue PPI 6. Accelerated hypertension- I will continue her usual medications. I don't mind her blood pressure being high overnight with the possibility of being a stroke. 7. Hypokalemia- replace potassium orally. Check magnesium in a.m.  All the records are reviewed and case discussed with ED provider. Management plans discussed with the patient, family and they are in agreement.  CODE STATUS: Full code  TOTAL TIME TAKING CARE OF THIS PATIENT: 50 minutes.    Loletha Grayer M.D on 07/30/2015 at 9:25 PM  Between 7am to 6pm - Pager - [phone]  After 6pm call admission pager [facility]  Office  [phone]  CC: Primary care physician; Wilhemena Durie, MD

## 2015-07-30 NOTE — ED Notes (Signed)
Pt became dizzy passed out, pt reports feeling numbness on left hand and face, grips equal, face symmetrical

## 2015-07-30 NOTE — ED Notes (Signed)
Patient transported to MRI 

## 2015-07-30 NOTE — Progress Notes (Signed)
   07/30/15 2000  Clinical Encounter Type  Visited With Patient and family together  Visit Type Initial;Spiritual support  Referral From Nurse  Consult/Referral To Chaplain  Referred to patient from ED secretary with potential code stroke. Met with patient and daughter. Offered comforting presence. [place]

## 2015-07-30 NOTE — ED Provider Notes (Signed)
[facility] Emergency Department Provider Note   ____________________________________________  Time seen: Upon ED arrival I have reviewed the triage vital signs and the triage nursing note.  HISTORY  Chief Complaint Code Stroke   Historian Patient  HPI Dawn [name] is a 81 y.o. female with history of prior stroke, for which she takes baby aspirin, is here for evaluation after syncopal episode this afternoon. Patient states she was bringing her groceries when she felt very lightheaded and tried to make it inside. States that she must have passed out because she woke up on the ground. She is not complaining of any head injury, neck pain or neck injury, chest pain or chest injury, abdominal pain, or extremity pain. Patient states that she then noticed a acute worsening of left arm numbness and left face numbness. This was noted around 4:45 PM. She does have chronic left face and left upper extremity numbness after a stroke 2 years ago, but this was noticeably worse. No weakness, altered mental status, slurred speech, or facial droop.  She had no chest pain or palpitations or shortness of breath or nausea or vomiting prior to the syncopal episode.    Past Medical History  Diagnosis Date  . Arthritis   . Hypertension   . Stomach ulcer   . Acid reflux   . Gastric polyp 2014    Patient Active Problem List   Diagnosis Date Noted  . Allergic rhinitis 01/05/2015  . Cerebral infarct ([place]) 01/05/2015  . Accelerated essential hypertension 01/05/2015  . Benign gastric polyp 01/05/2015  . Hypercholesteremia 01/05/2015  . Neuropathy ([place]) 01/05/2015  . Adiposity 01/05/2015  . Arthritis of knee, degenerative 01/05/2015  . Borderline diabetes 01/05/2015  . Temporary cerebral vascular dysfunction 01/05/2015  . GERD (gastroesophageal reflux disease) 03/31/2013  . Gastric polyp     Past Surgical History  Procedure Laterality Date  . Upper gi endoscopy  2011   . Vascular surgery Right 2010    right rental artery   . Cardiac catheterization  2007    normal, [employer] [facility] [place]   . Colonoscopy  2011, 2015    Dr. Nicolasa Ducking, Dr Melody Comas  . Cataract surgery   2013  . Laminectomy  2011    decompression   . Back surgery  2011    vertebroplasty   . Carpal tunnel release  2007  . Colon surgery  2015    lap sigmoid resection    Current Outpatient Rx  Name  Route  Sig  Dispense  Refill  . aspirin 81 MG tablet   Oral   Take 81 mg by mouth daily.         . Biotin 2500 MCG CAPS   Oral   Take 5,000 mcg by mouth daily.          . Calcium Carbonate-Vit D-Min (CALCIUM 600 + MINERALS) 600-200 MG-UNIT TABS   Oral   Take by mouth.         . fexofenadine (ALLEGRA) 180 MG tablet   Oral   Take 180 mg by mouth daily.         . hydrocortisone 2.5 % ointment      APPLY TO AFFECTED AREAS TWICE A DAY AS NEEDED      0   . labetalol (NORMODYNE) 200 MG tablet   Oral   Take by mouth 2 (two) times daily.          . Olmesartan-Amlodipine-HCTZ (TRIBENZOR) 40-10-25 MG TABS   Oral  Take by mouth.         [name] omeprazole (PRILOSEC) 20 MG capsule   Oral   Take by mouth.         . ranitidine (ZANTAC) 150 MG tablet   Oral   Take by mouth.           Allergies Quinine; Quinine derivatives; Celecoxib; and Codeine  Family History  Problem Relation Age of Onset  . Cancer Father     stomach  . Emphysema Father   . Breast cancer Paternal Aunt   . Breast cancer Paternal Grandmother   . Breast cancer Paternal Aunt   . Breast cancer Paternal Aunt   . Breast cancer Paternal Aunt   . Hypertension Mother   . Atrial fibrillation Mother   . Diabetes Sister   . Hypertension Sister   . Skin cancer Sister   . Hypertension Brother   . Heart disease Brother   . Heart disease Sister   . Colon polyps Sister   . Hypertension Sister   . Thyroid disease Sister   . Irritable bowel syndrome Sister   . Heart disease Brother   . Colon  polyps Brother   . Diabetes Brother   . Hypertension Brother   . Neuropathy Brother   . Post-traumatic stress disorder Brother   . Hypertension Brother     Social History Social History  Substance Use Topics  . Smoking status: Never Smoker   . Smokeless tobacco: Never Used  . Alcohol Use: No    Review of Systems  Constitutional: Negative for fever. Eyes: Negative for visual changes. ENT: Negative for sore throat. Cardiovascular: Negative for chest pain. Respiratory: Negative for shortness of breath. Gastrointestinal: Negative for abdominal pain, vomiting and diarrhea. Genitourinary: Negative for dysuria. Musculoskeletal: Negative for back pain. Skin: Negative for rash. Neurological: Negative for headache. 10 point Review of Systems otherwise negative ____________________________________________   PHYSICAL EXAM:  VITAL SIGNS: ED Triage Vitals  Enc Vitals Group     BP 07/30/15 1836 198/81 mmHg     Pulse Rate 07/30/15 1836 94     Resp 07/30/15 1836 22     Temp 07/30/15 1836 97.7 F (36.5 C)     Temp Source 07/30/15 1836 Oral     SpO2 07/30/15 1836 94 %     Weight 07/30/15 1836 194 lb (87.998 kg)     Height 07/30/15 1836 5\' 1"  (1.549 m)     Head Cir --      Peak Flow --      Pain Score --      Pain Loc --      Pain Edu? --      Excl. in [place]? --      Constitutional: Alert and oriented. Well appearing and in no distress. Eyes: Conjunctivae are normal. PERRL. Normal extraocular movements. ENT   Head: Normocephalic and atraumatic.   Nose: No congestion/rhinnorhea.   Mouth/Throat: Mucous membranes are moist.   Neck: No stridor. Cardiovascular/Chest: Normal rate, regular rhythm.  No murmurs, rubs, or gallops. Respiratory: Normal respiratory effort without tachypnea nor retractions. Breath sounds are clear and equal bilaterally. No wheezes/rales/rhonchi. Gastrointestinal: Soft. No distention, no guarding, no rebound. Nontender. Obese.   Genitourinary/rectal:Deferred Musculoskeletal: Nontender with normal range of motion in all extremities. No joint effusions.  No lower extremity tenderness.  No edema. Neurologic:  Normal speech and language.  No facial droop. 5 out of 5 strength in 4 extremities. Numbness/paresthesia left face and left upper extremity. Skin:  Skin  is warm, dry and intact. No rash noted. Psychiatric: Mood and affect are normal. Speech and behavior are normal. Patient exhibits appropriate insight and judgment.  ____________________________________________   EKG I, Lisa Roca, MD, the attending physician have personally viewed and interpreted all ECGs.  70 bpm. Normal sinus rhythm. Narrow QRS. Normal axis. Nonspecific ST and T-wave. ____________________________________________  LABS (pertinent positives/negatives)  INR 0.95 CBC within normal limits Comprehensive metabolic panel significant for sodium 134, potassium 3.2, BUN 21 and creatinine 1.33 next line troponin less than 0.03  ____________________________________________  RADIOLOGY All Xrays were viewed by me. Imaging interpreted by Radiologist.  CT noncontrast:  IMPRESSION: Subtle hypoattenuated appearance of the subcortical matter of the left frontoparietal lobe. Acute nonhemorrhagic infarction cannot be excluded.  No evidence of hemorrhage or mass effect.  These results were called by telephone at the time of interpretation on 07/30/2015 at 6:59 pm to Dr. Harvest Dark , who verbally acknowledged these results. __________________________________________  PROCEDURES  Procedure(s) performed: None  Critical Care performed: None  ____________________________________________   ED COURSE / ASSESSMENT AND PLAN  CONSULTATIONS: Telephone neurologist consultation, and Hospitalist for admission  Pertinent labs & imaging results that were available during my care of the patient were reviewed by me and considered in my medical  decision making (see chart for details).  Patient arrived and initiated as code stroke due to acute onset of facial and left arm numbness. Patient's CT is concerning for possible acute infarct. Symptoms are minor and slightly improving, and so patient is likely not a TPA candidate. Blood pressure systolic [id number], left mildly elevated due to possibility of acute ischemic stroke.  NIH stroke scale 1.  Neurologist consulted.  Neurologist did not recommend TPA. Patient will be admitted for evaluation for syncope as well as left arm and face numbness.   Patient / Family / Caregiver informed of clinical course, medical decision-making process, and agree with plan.    ___________________________________________   FINAL CLINICAL IMPRESSION(S) / ED DIAGNOSES   Final diagnoses:  Syncope, unspecified syncope type  Left arm numbness  Numbness and tingling of left side of face       Lisa Roca, MD 07/30/15 2035

## 2015-07-30 NOTE — ED Notes (Signed)
Patient transported to Room 2A-233

## 2015-07-31 ENCOUNTER — Observation Stay: Payer: Medicare Other

## 2015-07-31 ENCOUNTER — Observation Stay
Admit: 2015-07-31 | Discharge: 2015-07-31 | Disposition: A | Discharge status: Home / Self Care | Payer: Medicare Other | Attending: Internal Medicine | Admitting: Internal Medicine

## 2015-07-31 DIAGNOSIS — E876 Hypokalemia: Secondary | ICD-10-CM | POA: Diagnosis not present

## 2015-07-31 DIAGNOSIS — R930 Abnormal findings on diagnostic imaging of skull and head, not elsewhere classified: Secondary | ICD-10-CM | POA: Diagnosis not present

## 2015-07-31 DIAGNOSIS — I10 Essential (primary) hypertension: Secondary | ICD-10-CM | POA: Diagnosis not present

## 2015-07-31 DIAGNOSIS — R55 Syncope and collapse: Secondary | ICD-10-CM | POA: Diagnosis not present

## 2015-07-31 DIAGNOSIS — I6523 Occlusion and stenosis of bilateral carotid arteries: Secondary | ICD-10-CM | POA: Diagnosis not present

## 2015-07-31 LAB — BASIC METABOLIC PANEL
ANION GAP: 9 (ref 5–15)
BUN: 19 mg/dL (ref 6–20)
CHLORIDE: 102 mmol/L (ref 101–111)
CO2: 25 mmol/L (ref 22–32)
Calcium: 9.3 mg/dL (ref 8.9–10.3)
Creatinine, Ser: 1.14 mg/dL — ABNORMAL HIGH (ref 0.44–1.00)
GFR calc non Af Amer: 45 mL/min — ABNORMAL LOW (ref >60)
GFR, EST AFRICAN AMERICAN: 52 mL/min — AB (ref >60)
Glucose, Bld: 103 mg/dL — ABNORMAL HIGH (ref 65–99)
POTASSIUM: 3.6 mmol/L (ref 3.5–5.1)
SODIUM: 136 mmol/L (ref 135–145)

## 2015-07-31 LAB — CBC
HEMATOCRIT: 38 % (ref 35.0–47.0)
HEMOGLOBIN: 12.8 g/dL (ref 12.0–16.0)
MCH: 30.2 pg (ref 26.0–34.0)
MCHC: 33.8 g/dL (ref 32.0–36.0)
MCV: 89.4 fL (ref 80.0–100.0)
Platelets: 213 10*3/uL (ref 150–440)
RBC: 4.26 MIL/uL (ref 3.80–5.20)
RDW: 13.2 % (ref 11.5–14.5)
WBC: 7 10*3/uL (ref 3.6–11.0)

## 2015-07-31 LAB — LIPID PANEL
CHOL/HDL RATIO: 4.9 ratio
Cholesterol: 250 mg/dL — ABNORMAL HIGH (ref 0–200)
HDL: 51 mg/dL (ref >40)
LDL CALC: 164 mg/dL — AB (ref 0–99)
Triglycerides: 175 mg/dL — ABNORMAL HIGH (ref <150)
VLDL: 35 mg/dL (ref 0–40)

## 2015-07-31 LAB — MAGNESIUM: MAGNESIUM: 1.4 mg/dL — AB (ref 1.7–2.4)

## 2015-07-31 MED ORDER — SIMVASTATIN 20 MG PO TABS: 20.0000 mg | ORAL_TABLET | Freq: Every day | ORAL | Status: DC

## 2015-07-31 NOTE — Progress Notes (Signed)
Pt is a&o, VSS, NSR on tele with no complaints of pain or discomfort. Orders to D/C pt to home with no home health needs. Discharge instructions and prescription given to pt with verbal acknowledgment of understanding. IV and tele removed and pt to be escorted off unit by nursing.

## 2015-07-31 NOTE — Discharge Summary (Signed)
[facility] NAME: Dawn [name]    MR#:  [id number]  DATE OF BIRTH:  [birth year]  DATE OF ADMISSION:  07/30/2015 ADMITTING PHYSICIAN: Loletha Grayer, MD  DATE OF DISCHARGE: 07/31/2015 12:56 PM  PRIMARY CARE PHYSICIAN: Wilhemena Durie, MD    ADMISSION DIAGNOSIS:  Syncope [R55] Left arm numbness [R20.8] Abnormal CT scan, head [R93.0] Numbness and tingling of left side of face [R20.0] Syncope, unspecified syncope type [R55]  DISCHARGE DIAGNOSIS:  Active Problems:   Syncope and collapse   SECONDARY DIAGNOSIS:   Past Medical History  Diagnosis Date  . Arthritis   . Hypertension   . Stomach ulcer   . Acid reflux   . Gastric polyp 2014  . Stroke ([place])   . Back pain     HOSPITAL COURSE:   81 year old female with past medical history of hypertension, previous CVA, GERD, osteoarthritis, back pain who presented to the hospital with a syncopal episode.  #1 syncope-the exact etiology of syncope is unclear. Given patient's history of previous CVA she was admitted overnight and underwent an extensive workup including a CT head, MRI of the brain which were essentially negative for any acute neurologic source. -Patient also had a carotid duplex which showed no hemodynamically significant carotid artery stenosis, and an echocardiogram which showed normal ejection fraction. -She was also observed on telemetry and had no further evidence of acute arrhythmias. Clinically she was doing well and therefore discharged home with close follow-up with her primary care physician. -Patient may benefit from a loop/event monitor to be placed as an outpatient.  #2 hyperlipidemia-patient's total cholesterol was over 200 and LDL greater than 160. She has a previous history of CVA and therefore was started on low-dose simvastatin prior to discharge.  #3 hypertension-patient will continue her labetalol, losartan, HCTZ, amlodipine.  #4  GERD-patient will continue her omeprazole, Zantac  DISCHARGE CONDITIONS:   Stable  CONSULTS OBTAINED:     DRUG ALLERGIES:   Allergies  Allergen Reactions  . Quinine Anaphylaxis and Swelling  . Celecoxib Rash  . Codeine Rash    DISCHARGE MEDICATIONS:   Discharge Medication List as of 07/31/2015 12:03 PM    START taking these medications   Details  simvastatin (ZOCOR) 20 MG tablet Take 1 tablet (20 mg total) by mouth daily., Starting 07/31/2015, Until Discontinued, Print      CONTINUE these medications which have NOT CHANGED   Details  acetaminophen (TYLENOL) 325 MG tablet Take 650 mg by mouth every 6 (six) hours as needed for mild pain., Until Discontinued, Historical Med    aspirin EC 81 MG tablet Take 81 mg by mouth daily., Until Discontinued, Historical Med    Biotin 5000 MCG CAPS Take 5,000 mcg by mouth daily., Until Discontinued, Historical Med    Calcium Carbonate-Vitamin D (CALCIUM 600+D) 600-200 MG-UNIT TABS Take 1 tablet by mouth daily., Until Discontinued, Historical Med    fexofenadine (ALLEGRA) 180 MG tablet Take 180 mg by mouth daily., Until Discontinued, Historical Med    labetalol (NORMODYNE) 200 MG tablet Take 200 mg by mouth 2 (two) times daily., Until Discontinued, Historical Med    Olmesartan-Amlodipine-HCTZ (TRIBENZOR) 40-10-25 MG TABS Take 1 tablet by mouth daily., Until Discontinued, Historical Med    omeprazole (PRILOSEC) 20 MG capsule Take 20 mg by mouth daily., Until Discontinued, Historical Med    ranitidine (ZANTAC) 150 MG tablet Take 150 mg by mouth at bedtime as needed for heartburn., Until Discontinued, Historical Med  DISCHARGE INSTRUCTIONS:   DIET:  Cardiac diet  DISCHARGE CONDITION:  Stable  ACTIVITY:  Activity as tolerated  OXYGEN:  Home Oxygen: No.   Oxygen Delivery: room air  DISCHARGE LOCATION:  home   If you experience worsening of your admission symptoms, develop shortness of breath, life threatening  emergency, suicidal or homicidal thoughts you must seek medical attention immediately by calling 911 or calling your MD immediately  if symptoms less severe.  You Must read complete instructions/literature along with all the possible adverse reactions/side effects for all the Medicines you take and that have been prescribed to you. Take any new Medicines after you have completely understood and accpet all the possible adverse reactions/side effects.   Please note  You were cared for by a hospitalist during your hospital stay. If you have any questions about your discharge medications or the care you received while you were in the hospital after you are discharged, you can call the unit and asked to speak with the hospitalist on call if the hospitalist that took care of you is not available. Once you are discharged, your primary care physician will handle any further medical issues. Please note that NO REFILLS for any discharge medications will be authorized once you are discharged, as it is imperative that you return to your primary care physician (or establish a relationship with a primary care physician if you do not have one) for your aftercare needs so that they can reassess your need for medications and monitor your lab values.     Today   No headache, numbness, tingling. No blurry vision. No other further acute neurologic symptoms. No further episodes of syncope overnight.  VITAL SIGNS:  Blood pressure 151/55, pulse 64, temperature 98 F (36.7 C), temperature source Oral, resp. rate 18, height 5\' 1"  (1.549 m), weight 90.22 kg (198 lb 14.4 oz), SpO2 98 %.  I/O:   Intake/Output Summary (Last 24 hours) at 07/31/15 1516 Last data filed at 07/31/15 1100  Gross per 24 hour  Intake    510 ml  Output   1000 ml  Net   -490 ml    PHYSICAL EXAMINATION:  GENERAL:  81 y.o.-year-old patient lying in the bed with no acute distress.  EYES: Pupils equal, round, reactive to light and  accommodation. No scleral icterus. Extraocular muscles intact.  HEENT: Head atraumatic, normocephalic. Oropharynx and nasopharynx clear.  NECK:  Supple, no jugular venous distention. No thyroid enlargement, no tenderness.  LUNGS: Normal breath sounds bilaterally, no wheezing, rales,rhonchi. No use of accessory muscles of respiration.  CARDIOVASCULAR: S1, S2 normal. No murmurs, rubs, or gallops.  ABDOMEN: Soft, non-tender, non-distended. Bowel sounds present. No organomegaly or mass.  EXTREMITIES: No pedal edema, cyanosis, or clubbing.  NEUROLOGIC: Cranial nerves II through XII are intact. No focal motor or sensory defecits b/l.  PSYCHIATRIC: The patient is alert and oriented x 3. Good affect.  SKIN: No obvious rash, lesion, or ulcer.   DATA REVIEW:   CBC  Recent Labs Lab 07/31/15 0404  WBC 7.0  HGB 12.8  HCT 38.0  PLT 213    Chemistries   Recent Labs Lab 07/30/15 1902 07/31/15 0404  NA 134* 136  K 3.2* 3.6  CL 99* 102  CO2 24 25  GLUCOSE 126* 103*  BUN 21* 19  CREATININE 1.33* 1.14*  CALCIUM 9.3 9.3  MG  --  1.4*  AST 29  --   ALT 32  --   ALKPHOS 85  --   BILITOT  0.7  --     Cardiac Enzymes  Recent Labs Lab 07/30/15 1902  TROPONINI <0.03    RADIOLOGY:  Ct Head Wo Contrast  07/30/2015  CLINICAL DATA:  Sudden onset of dizziness, status post fall. History of stroke. EXAM: CT HEAD WITHOUT CONTRAST TECHNIQUE: Contiguous axial images were obtained from the base of the skull through the vertex without intravenous contrast. COMPARISON:  MRI of the head dated 06/07/2013 FINDINGS: Brain: No evidence of acute hemorrhage, extra-axial collection, ventriculomegaly, or mass effect. There is subtle hypoattenuated appearance of the subcortical matter of the left frontoparietal lobe. Vascular: No hyperdense vessel. Mild vascular calcifications are noted at the skullbase. Skull: Negative for fracture or focal lesion. Sinuses/Orbits: No acute findings. Other: None. IMPRESSION:  Subtle hypoattenuated appearance of the subcortical matter of the left frontoparietal lobe. Acute nonhemorrhagic infarction cannot be excluded. No evidence of hemorrhage or mass effect. These results were called by telephone at the time of interpretation on 07/30/2015 at 6:59 pm to Dr. Harvest Dark , who verbally acknowledged these results. Electronically Signed   By: Fidela Salisbury M.D.   On: 07/30/2015 18:59   Mr Brain Wo Contrast  07/30/2015  CLINICAL DATA:  Syncopal episode after grocery shopping, fell to ground. LEFT arm numbness, lip tingling. History of stroke 2 years ago with residual LEFT arm weakness. History of hypertension. EXAM: MRI HEAD WITHOUT CONTRAST TECHNIQUE: Multiplanar, multiecho pulse sequences of the brain and surrounding structures were obtained without intravenous contrast. COMPARISON:  CT head July 30, 2015 at 1842 hours and MRI of the brain June 07, 2013 FINDINGS: The ventricles and sulci are normal for patient's age. No abnormal parenchymal signal, mass lesions, mass effect. Patchy supratentorial and pontine white matter FLAIR T2 hyperintensities are slightly progressed. Old RIGHT thalamus and LEFT basal ganglia lacunar infarcts. No reduced diffusion to suggest acute ischemia. No susceptibility artifact to suggest hemorrhage. No abnormal extra-axial fluid collections. No extra-axial masses though, contrast enhanced sequences would be more sensitive. Normal major intracranial vascular flow voids seen at the skull base. Status post bilateral ocular lens implants. No abnormal sellar expansion. Visualized paranasal sinuses are well aerated. Small bilateral mastoid effusions. No suspicious calvarial bone marrow signal. Craniocervical junction maintained. IMPRESSION: No acute intracranial process, specifically no acute ischemia. Moderate white matter changes/chronic small vessel ischemic disease, mildly progressed from prior examination. Old RIGHT thalamus, LEFT basal  ganglia lacunar infarcts. Electronically Signed   By: Elon Alas M.D.   On: 07/30/2015 23:51   US Carotid Bilateral  07/31/2015  CLINICAL DATA:  Numbness in the left arm and face.  Syncope. EXAM: BILATERAL CAROTID DUPLEX ULTRASOUND TECHNIQUE: Pearline Cables scale imaging, color Doppler and duplex ultrasound were performed of bilateral carotid and vertebral arteries in the neck. COMPARISON:  06/07/2013 FINDINGS: Criteria: Quantification of carotid stenosis is based on velocity parameters that correlate the residual internal carotid diameter with NASCET-based stenosis levels, using the diameter of the distal internal carotid lumen as the denominator for stenosis measurement. The following velocity measurements were obtained: RIGHT ICA:  96 cm/sec CCA:  91 cm/sec SYSTOLIC ICA/CCA RATIO:  1.1 DIASTOLIC ICA/CCA RATIO:  1.8 ECA:  145 cm/sec LEFT ICA:  119 cm/sec CCA:  85 cm/sec SYSTOLIC ICA/CCA RATIO:  1.4 DIASTOLIC ICA/CCA RATIO:  1.9 ECA:  142 cm/sec RIGHT CAROTID ARTERY: Small amount of scattered plaque throughout the right common carotid artery and right carotid bulb. Right external carotid artery is patent. Mild plaque in the proximal internal carotid artery without significant stenosis. RIGHT VERTEBRAL ARTERY: Antegrade  flow and normal waveform in the right vertebral artery. LEFT CAROTID ARTERY: Small amount of echogenic plaque in the left common carotid artery. Echogenic plaque at the left carotid bulb. Left external carotid artery is patent. Echogenic plaque in the proximal internal carotid artery without significant stenosis. LEFT VERTEBRAL ARTERY: Antegrade flow and normal waveform in the left vertebral artery. IMPRESSION: Atherosclerotic plaque throughout the carotid arteries bilaterally. Estimated degree of stenosis in the internal carotid arteries is less than 50% bilaterally. Patent vertebral arteries. Electronically Signed   By: Markus Daft M.D.   On: 07/31/2015 11:52      Management plans discussed  with the patient, family and they are in agreement.  CODE STATUS:     Code Status Orders        Start     Ordered   07/30/15 2117  Full code   Continuous     07/30/15 2117      TOTAL TIME TAKING CARE OF THIS PATIENT: 40 minutes.    Henreitta Leber M.D on 07/31/2015 at 3:16 PM  Between 7am to 6pm - Pager - [phone]  After 6pm go to [email or web address] - password [email or web address]  Office  [phone]  CC: Primary care physician; Wilhemena Durie, MD

## 2015-07-31 NOTE — Progress Notes (Signed)
Informed Dr. Marcille Blanco that patient mg 1.4.. No new orders at this time.

## 2015-08-02 ENCOUNTER — Telehealth: Payer: Self-pay | Admitting: Family Medicine

## 2015-08-02 NOTE — Telephone Encounter (Signed)
Spoke with pt about her ER visit. She reports that she was feeling better, but she has a headache today and her BP is elevated at 181/114. She will check her BP again after she takes her BP med. She has not taken it this morning yet. I told her to take her Tribenzor and recheck her BP in a little bit to see if it has gone down any.  She has an appt with [place] tomorrow.

## 2015-08-02 NOTE — Telephone Encounter (Signed)
Pt is scheduled on 08/03/15 @ 245 for ER F/U. Pt stated she passed out and fell in the floor on Friday 07/30/15 and went to [facility] ER. Thanks TNP

## 2015-08-03 ENCOUNTER — Ambulatory Visit (INDEPENDENT_AMBULATORY_CARE_PROVIDER_SITE_OTHER): Payer: Medicare Other | Admitting: Family Medicine

## 2015-08-03 VITALS — BP 148/62 | HR 76 | Temp 98.2°F | Resp 16 | Wt 202.0 lb

## 2015-08-03 DIAGNOSIS — R4020 Unspecified coma: Secondary | ICD-10-CM

## 2015-08-03 DIAGNOSIS — R404 Transient alteration of awareness: Secondary | ICD-10-CM | POA: Diagnosis not present

## 2015-08-03 DIAGNOSIS — R55 Syncope and collapse: Secondary | ICD-10-CM

## 2015-08-03 DIAGNOSIS — Z09 Encounter for follow-up examination after completed treatment for conditions other than malignant neoplasm: Secondary | ICD-10-CM | POA: Diagnosis not present

## 2015-08-03 NOTE — Progress Notes (Signed)
Patient ID: Dawn [name], female   DOB: 06/19/1934, 81 y.o.   MRN: [id number]   Dawn [name]  MRN: [id number] DOB: [birth year]  Subjective:  HPI   1. Hospital discharge follow-up Patient os a 81 year old female who presents for follow up of recent hospitalization.Admision on 07/30/15 to 07/31/15.  2. Syncope and collapse Patient states she came home from shopping and while walking up the steps with her groceries became dizzy.  She states she remembers getting into the house but then the next thing she remembers is that she was on the floor, groceries on the floor and the table that sits just inside the door was moved all the up against the bar.  Patient was not injured but due to continued dizziness she was unable to get up.  She slid herself across the dining floor and was able to get into the den and called her relatives from sitting on the floor.  They arrived helped her up, gave her an Aspirin because of her history of stroke and insisted she go to the ER.  Patient states that in the ER they at first thought she had another stroke, MRI revealed it was findings to suggest stroke but neurology said it was an old stroke.  Then she said she was told she had atrial fibrillation and was going to need a cardiologist but at the time of discharge she was told she did not need to follow up with that.  She is still unsure of the cause of her syncope and LOC.  Patient states she is still weak, shakey and sometimes staggering.  3. LOC (loss of consciousness) Patient states that when she got out of the car she saw the car clock and it was 4:40 pm.  When her sister in law arrived it was 5:15.  She said she does not know how long she was out but it did take her a long time to get to where she could use the phone to make the call.   Patient Active Problem List   Diagnosis Date Noted  . Syncope and collapse 07/30/2015  . Allergic rhinitis 01/05/2015  . Cerebral infarct ([place]) 01/05/2015  . Accelerated  essential hypertension 01/05/2015  . Benign gastric polyp 01/05/2015  . Hypercholesteremia 01/05/2015  . Neuropathy ([place]) 01/05/2015  . Adiposity 01/05/2015  . Arthritis of knee, degenerative 01/05/2015  . Borderline diabetes 01/05/2015  . Temporary cerebral vascular dysfunction 01/05/2015  . GERD (gastroesophageal reflux disease) 03/31/2013  . Gastric polyp     Past Medical History  Diagnosis Date  . Arthritis   . Hypertension   . Stomach ulcer   . Acid reflux   . Gastric polyp 2014  . Stroke ([place])   . Back pain     Social History   Social History  . Marital Status: Divorced    Spouse Name: none  . Number of Children: 1  . Years of Education: 13   Occupational History  . retired    Social History Main Topics  . Smoking status: Never Smoker   . Smokeless tobacco: Never Used  . Alcohol Use: No  . Drug Use: No  . Sexual Activity: No   Other Topics Concern  . Not on file   Social History Narrative    Outpatient Prescriptions Prior to Visit  Medication Sig Dispense Refill  . acetaminophen (TYLENOL) 325 MG tablet Take 650 mg by mouth every 6 (six) hours as needed for mild pain.    [name]  aspirin EC 81 MG tablet Take 81 mg by mouth daily.    . Biotin 5000 MCG CAPS Take 5,000 mcg by mouth daily.    . Calcium Carbonate-Vitamin D (CALCIUM 600+D) 600-200 MG-UNIT TABS Take 1 tablet by mouth daily.    . fexofenadine (ALLEGRA) 180 MG tablet Take 180 mg by mouth daily.    [name] labetalol (NORMODYNE) 200 MG tablet Take 200 mg by mouth 2 (two) times daily.    . Olmesartan-Amlodipine-HCTZ (TRIBENZOR) 40-10-25 MG TABS Take 1 tablet by mouth daily.    [name] omeprazole (PRILOSEC) 20 MG capsule Take 20 mg by mouth daily.    . ranitidine (ZANTAC) 150 MG tablet Take 150 mg by mouth at bedtime as needed for heartburn.    . simvastatin (ZOCOR) 20 MG tablet Take 1 tablet (20 mg total) by mouth daily. 30 tablet 2   No facility-administered medications prior to visit.    Allergies  Allergen  Reactions  . Quinine Anaphylaxis and Swelling  . Celecoxib Rash  . Codeine Rash    Review of Systems  Constitutional: Negative for fever, chills, weight loss, malaise/fatigue and diaphoresis.  HENT: Positive for congestion (Post nasal drainage and sneezing). Negative for ear pain and tinnitus.   Eyes: Negative for blurred vision and double vision.  Respiratory: Positive for cough and shortness of breath (Not new). Negative for hemoptysis, sputum production and wheezing.   Cardiovascular: Positive for leg swelling. Negative for chest pain, palpitations and orthopnea.  Neurological: Positive for dizziness, tingling (left arm ), loss of consciousness (prior to hospitalization), weakness and headaches. Negative for sensory change (Withthe exception of her left hand) and speech change.   Objective:  BP 148/62 mmHg  Pulse 76  Temp(Src) 98.2 F (36.8 C) (Oral)  Resp 16  Wt 202 lb (91.627 kg)  Physical Exam  Constitutional: She is oriented to person, place, and time and well-developed, well-nourished, and in no distress.  Cardiovascular: Normal rate, regular rhythm and normal heart sounds.   Pulmonary/Chest: Effort normal and breath sounds normal.  Neurological: She is alert and oriented to person, place, and time. Gait normal.  Nonfocal exam. Sensory exam also normal. Patient says she has a little tingling into her left hand and arm.  Skin: Skin is warm and dry.  Significant bruising on left side of body, under arm and left hip  Psychiatric: Mood, memory, affect and judgment normal.    Assessment and Plan :  1. Hospital discharge follow-up Patient is to take the Zocor she was started on in the hospital.  She is to follow up with [place] for Lipids in 2-4 weeks.  2. Syncope and collapse Needs complete syncope w/u presently as present story does not give clear answer to this. - Ambulatory referral to Cardiology - Ambulatory referral to Neurology  3. LOC (loss of consciousness) Due to  syncope. - Ambulatory referral to Cardiology - Ambulatory referral to Neurology    Miguel Aschoff MD [facility] 08/03/2015 3:20 PM

## 2015-08-09 DIAGNOSIS — I6523 Occlusion and stenosis of bilateral carotid arteries: Secondary | ICD-10-CM | POA: Diagnosis not present

## 2015-08-09 DIAGNOSIS — I10 Essential (primary) hypertension: Secondary | ICD-10-CM | POA: Diagnosis not present

## 2015-08-09 DIAGNOSIS — R55 Syncope and collapse: Secondary | ICD-10-CM | POA: Diagnosis not present

## 2015-08-09 DIAGNOSIS — E7800 Pure hypercholesterolemia, unspecified: Secondary | ICD-10-CM | POA: Diagnosis not present

## 2015-08-09 DIAGNOSIS — I6529 Occlusion and stenosis of unspecified carotid artery: Secondary | ICD-10-CM | POA: Insufficient documentation

## 2015-08-12 DIAGNOSIS — R55 Syncope and collapse: Secondary | ICD-10-CM | POA: Diagnosis not present

## 2015-08-12 DIAGNOSIS — I6523 Occlusion and stenosis of bilateral carotid arteries: Secondary | ICD-10-CM | POA: Diagnosis not present

## 2015-08-12 DIAGNOSIS — I10 Essential (primary) hypertension: Secondary | ICD-10-CM | POA: Diagnosis not present

## 2015-08-13 DIAGNOSIS — R55 Syncope and collapse: Secondary | ICD-10-CM | POA: Diagnosis not present

## 2015-08-23 ENCOUNTER — Other Ambulatory Visit: Payer: Self-pay | Admitting: Family Medicine

## 2015-08-24 ENCOUNTER — Ambulatory Visit (INDEPENDENT_AMBULATORY_CARE_PROVIDER_SITE_OTHER): Payer: Medicare Other | Admitting: Family Medicine

## 2015-08-24 VITALS — BP 132/60 | HR 72 | Temp 98.3°F | Resp 16 | Wt 205.0 lb

## 2015-08-24 DIAGNOSIS — M545 Low back pain, unspecified: Secondary | ICD-10-CM

## 2015-08-24 DIAGNOSIS — M791 Myalgia, unspecified site: Secondary | ICD-10-CM

## 2015-08-24 DIAGNOSIS — E7800 Pure hypercholesterolemia, unspecified: Secondary | ICD-10-CM

## 2015-08-24 DIAGNOSIS — T148 Other injury of unspecified body region: Secondary | ICD-10-CM

## 2015-08-24 DIAGNOSIS — I809 Phlebitis and thrombophlebitis of unspecified site: Secondary | ICD-10-CM | POA: Diagnosis not present

## 2015-08-24 DIAGNOSIS — T148XXA Other injury of unspecified body region, initial encounter: Secondary | ICD-10-CM

## 2015-08-24 MED ORDER — TRAMADOL HCL 50 MG PO TABS: 50.0000 mg | ORAL_TABLET | ORAL | Status: DC | PRN

## 2015-08-24 NOTE — Progress Notes (Signed)
Patient ID: GITTEL LIMMER, female   DOB: 03-23-1934, 81 y.o.   MRN: [id number]   VALMA METZGER  MRN: [id number] DOB: 06/19/1934  Subjective:  HPI   1. Hypercholesteremia The patient is a 81 year old female who presents today for follow up after having been started on Simvastatin when at the ED for syncope.  Patient has had statin intolerance in the past but states that so far she has tolerated the medication.   She does note that she is still having some dizziness.  Her blood pressure today is good but the patient reports that is was 159/97 yesterday morning.  Today it is 132/60.  Patient Active Problem List   Diagnosis Date Noted  . Syncope and collapse 07/30/2015  . Allergic rhinitis 01/05/2015  . Cerebral infarct ([place]) 01/05/2015  . Accelerated essential hypertension 01/05/2015  . Benign gastric polyp 01/05/2015  . Hypercholesteremia 01/05/2015  . Neuropathy ([place]) 01/05/2015  . Adiposity 01/05/2015  . Arthritis of knee, degenerative 01/05/2015  . Borderline diabetes 01/05/2015  . Temporary cerebral vascular dysfunction 01/05/2015  . GERD (gastroesophageal reflux disease) 03/31/2013  . Gastric polyp     Past Medical History  Diagnosis Date  . Arthritis   . Hypertension   . Stomach ulcer   . Acid reflux   . Gastric polyp 2014  . Stroke ([place])   . Back pain     Social History   Social History  . Marital Status: Divorced    Spouse Name: none  . Number of Children: 1  . Years of Education: 13   Occupational History  . retired    Social History Main Topics  . Smoking status: Never Smoker   . Smokeless tobacco: Never Used  . Alcohol Use: No  . Drug Use: No  . Sexual Activity: No   Other Topics Concern  . Not on file   Social History Narrative    Outpatient Prescriptions Prior to Visit  Medication Sig Dispense Refill  . acetaminophen (TYLENOL) 325 MG tablet Take 650 mg by mouth every 6 (six) hours as needed for mild pain.    [name] aspirin EC 81 MG tablet  Take 81 mg by mouth daily.    . Biotin 5000 MCG CAPS Take 5,000 mcg by mouth daily.    . Calcium Carbonate-Vitamin D (CALCIUM 600+D) 600-200 MG-UNIT TABS Take 1 tablet by mouth daily.    . fexofenadine (ALLEGRA) 180 MG tablet Take 180 mg by mouth daily.    [name] labetalol (NORMODYNE) 200 MG tablet Take 200 mg by mouth 2 (two) times daily.    . Olmesartan-Amlodipine-HCTZ (TRIBENZOR) 40-10-25 MG TABS Take 1 tablet by mouth daily.    [name] omeprazole (PRILOSEC) 20 MG capsule TAKE ONE CAPSULE BY MOUTH ONCE A DAY 30 capsule 9  . ranitidine (ZANTAC) 150 MG tablet Take 150 mg by mouth at bedtime as needed for heartburn.    . simvastatin (ZOCOR) 20 MG tablet Take 1 tablet (20 mg total) by mouth daily. 30 tablet 2   No facility-administered medications prior to visit.    Allergies  Allergen Reactions  . Quinine Anaphylaxis and Swelling  . Celecoxib Rash  . Codeine Rash    Review of Systems  Constitutional: Negative for fever, chills, malaise/fatigue and diaphoresis.  Eyes: Negative.   Respiratory: Negative for cough, shortness of breath and wheezing.   Cardiovascular: Positive for leg swelling. Negative for chest pain, palpitations and orthopnea.  Gastrointestinal: Negative.   Musculoskeletal: Positive for myalgias.  Improved off of statin  Neurological: Positive for dizziness and headaches. Negative for weakness.  Psychiatric/Behavioral: Negative.    Objective:  BP 132/60 mmHg  Pulse 72  Temp(Src) 98.3 F (36.8 C) (Oral)  Resp 16  Wt 205 lb (92.987 kg)  Physical Exam  Constitutional: She is oriented to person, place, and time and well-developed, well-nourished, and in no distress.  HENT:  Head: Normocephalic and atraumatic.  Neck: Normal range of motion.  Cardiovascular: Normal rate, regular rhythm and normal heart sounds.   Superficial thrombophlebitis of the right antecubital area secondary to recent IV with small hematoma  Pulmonary/Chest: Effort normal and breath sounds normal.   Abdominal: Soft.  Neurological: She is alert and oriented to person, place, and time. Gait normal.  Cranial nerves intact. Neurologic exam grossly nonfocal.  Skin: Skin is warm and dry.  Psychiatric: Mood, memory, affect and judgment normal.    Assessment and Plan :   1. Hypercholesteremia - Lipid Panel With LDL/HDL Ratio - COMPLETE METABOLIC PANEL WITH GFR On Zocor. 2. Myalgia - CK  3. Thrombophlebitis/phlebitis, superficial   4. Hematoma  5. Left-sided low back pain without sciatica - traMADol (ULTRAM) 50 MG tablet; Take 1 tablet (50 mg total) by mouth every 4 (four) hours as needed.  Dispense: 100 tablet; Refill: 5 Discussed risk and benefits of trying mild narcotic.  Miguel Aschoff MD [facility] 08/24/2015 1:34 PM

## 2015-08-25 DIAGNOSIS — I10 Essential (primary) hypertension: Secondary | ICD-10-CM | POA: Diagnosis not present

## 2015-08-25 DIAGNOSIS — M791 Myalgia: Secondary | ICD-10-CM | POA: Diagnosis not present

## 2015-08-25 DIAGNOSIS — E7800 Pure hypercholesterolemia, unspecified: Secondary | ICD-10-CM | POA: Diagnosis not present

## 2015-08-26 LAB — COMPREHENSIVE METABOLIC PANEL
ALBUMIN: 4.4 g/dL (ref 3.5–4.8)
ALT: 28 IU/L (ref 0–32)
AST: 20 IU/L (ref 0–40)
Albumin/Globulin Ratio: 2.6 — ABNORMAL HIGH (ref 1.1–2.5)
Alkaline Phosphatase: 84 IU/L (ref 39–117)
BUN / CREAT RATIO: 15 (ref 11–26)
BUN: 20 mg/dL (ref 8–27)
Bilirubin Total: 0.5 mg/dL (ref 0.0–1.2)
CALCIUM: 9.8 mg/dL (ref 8.7–10.3)
CO2: 23 mmol/L (ref 18–29)
CREATININE: 1.36 mg/dL — AB (ref 0.57–1.00)
Chloride: 93 mmol/L — ABNORMAL LOW (ref 96–106)
GFR, EST AFRICAN AMERICAN: 43 mL/min/{1.73_m2} — AB (ref >59)
GFR, EST NON AFRICAN AMERICAN: 37 mL/min/{1.73_m2} — AB (ref >59)
GLOBULIN, TOTAL: 1.7 g/dL (ref 1.5–4.5)
GLUCOSE: 95 mg/dL (ref 65–99)
Potassium: 4.4 mmol/L (ref 3.5–5.2)
SODIUM: 134 mmol/L (ref 134–144)
TOTAL PROTEIN: 6.1 g/dL (ref 6.0–8.5)

## 2015-08-26 LAB — LIPID PANEL WITH LDL/HDL RATIO
CHOLESTEROL TOTAL: 155 mg/dL (ref 100–199)
HDL: 59 mg/dL (ref >39)
LDL CALC: 69 mg/dL (ref 0–99)
LDL/HDL RATIO: 1.2 ratio (ref 0.0–3.2)
Triglycerides: 137 mg/dL (ref 0–149)
VLDL CHOLESTEROL CAL: 27 mg/dL (ref 5–40)

## 2015-08-26 LAB — CK: Total CK: 105 U/L (ref 24–173)

## 2015-10-06 DIAGNOSIS — R42 Dizziness and giddiness: Secondary | ICD-10-CM | POA: Diagnosis not present

## 2015-10-06 DIAGNOSIS — R55 Syncope and collapse: Secondary | ICD-10-CM | POA: Diagnosis not present

## 2015-10-06 DIAGNOSIS — R9082 White matter disease, unspecified: Secondary | ICD-10-CM | POA: Diagnosis not present

## 2015-10-07 ENCOUNTER — Other Ambulatory Visit: Payer: Self-pay

## 2015-10-07 MED ORDER — LABETALOL HCL 200 MG PO TABS: 200.0000 mg | ORAL_TABLET | Freq: Three times a day (TID) | ORAL | Status: DC

## 2015-10-07 NOTE — Telephone Encounter (Signed)
Fax from [facility] for Labetolol refill for 90 day supply. Is this ok to do?-aa

## 2015-10-19 ENCOUNTER — Ambulatory Visit (INDEPENDENT_AMBULATORY_CARE_PROVIDER_SITE_OTHER): Payer: Medicare Other | Admitting: Family Medicine

## 2015-10-19 VITALS — BP 118/46 | HR 60 | Resp 12 | Wt 204.0 lb

## 2015-10-19 DIAGNOSIS — M2550 Pain in unspecified joint: Secondary | ICD-10-CM | POA: Diagnosis not present

## 2015-10-19 DIAGNOSIS — E7800 Pure hypercholesterolemia, unspecified: Secondary | ICD-10-CM | POA: Diagnosis not present

## 2015-10-19 DIAGNOSIS — M25512 Pain in left shoulder: Secondary | ICD-10-CM | POA: Diagnosis not present

## 2015-10-19 DIAGNOSIS — R55 Syncope and collapse: Secondary | ICD-10-CM | POA: Diagnosis not present

## 2015-10-19 NOTE — Progress Notes (Signed)
Patient ID: Dawn [name], female   DOB: 12/07/1933, 82 y.o.   MRN: [id number]    Subjective:  HPI  Patient is here for 2 months follow up.  Patient ended up stop taking Zocor about 1 week ago due to severe joint pain she was having, she has noticed slight improvement with pain since stopping medication. She was put on this medication in November during hospital stay. We did re check lher levels in December on the Zocor and levels were much better on the medication. She states this is not the first statin she has tried and had some kind of an issue.  She has also seen Dr. Manuella Ghazi since last visit with [place] for syncope episode. He is suppose to get her set up for some therapy and crystal check in her inner ear. She has not heard anything about the appointments so far.  Prior to Admission medications   Medication Sig Start Date End Date Taking? Authorizing Provider  acetaminophen (TYLENOL) 325 MG tablet Take 650 mg by mouth every 6 (six) hours as needed for mild pain.   Yes Historical Provider, MD  aspirin EC 81 MG tablet Take 81 mg by mouth daily.   Yes Historical Provider, MD  Biotin 5000 MCG CAPS Take 5,000 mcg by mouth daily.   Yes Historical Provider, MD  Calcium Carbonate-Vitamin D (CALCIUM 600+D) 600-200 MG-UNIT TABS Take 1 tablet by mouth daily.   Yes Historical Provider, MD  fexofenadine (ALLEGRA) 180 MG tablet Take 180 mg by mouth daily.   Yes Historical Provider, MD  labetalol (NORMODYNE) 200 MG tablet Take 1 tablet (200 mg total) by mouth 3 (three) times daily. 10/07/15  Yes Richard Maceo Pro., MD  Olmesartan-Amlodipine-HCTZ [facility]) 40-10-25 MG TABS Take 1 tablet by mouth daily.   Yes Historical Provider, MD  omeprazole (PRILOSEC) 20 MG capsule TAKE ONE CAPSULE BY MOUTH ONCE A DAY 08/24/15  Yes Richard Maceo Pro., MD  ranitidine (ZANTAC) 150 MG tablet Take 150 mg by mouth at bedtime as needed for heartburn.   Yes Historical Provider, MD  traMADol (ULTRAM) 50 MG tablet Take 1  tablet (50 mg total) by mouth every 4 (four) hours as needed. 08/24/15  Yes Richard Maceo Pro., MD    Patient Active Problem List   Diagnosis Date Noted  . Carotid artery narrowing 08/09/2015  . Syncope and collapse 07/30/2015  . Allergic rhinitis 01/05/2015  . Cerebral infarct ([place]) 01/05/2015  . Accelerated essential hypertension 01/05/2015  . Benign gastric polyp 01/05/2015  . Hypercholesteremia 01/05/2015  . Neuropathy ([place]) 01/05/2015  . Adiposity 01/05/2015  . Arthritis of knee, degenerative 01/05/2015  . Borderline diabetes 01/05/2015  . Temporary cerebral vascular dysfunction 01/05/2015  . GERD (gastroesophageal reflux disease) 03/31/2013  . Gastric polyp     Past Medical History  Diagnosis Date  . Arthritis   . Hypertension   . Stomach ulcer   . Acid reflux   . Gastric polyp 2014  . Stroke ([place])   . Back pain     Social History   Social History  . Marital Status: Divorced    Spouse Name: none  . Number of Children: 1  . Years of Education: 13   Occupational History  . retired    Social History Main Topics  . Smoking status: Never Smoker   . Smokeless tobacco: Never Used  . Alcohol Use: No  . Drug Use: No  . Sexual Activity: No   Other Topics Concern  . Not  on file   Social History Narrative    Allergies  Allergen Reactions  . Quinine Anaphylaxis and Swelling  . Celecoxib Rash  . Codeine Rash    Review of Systems  Constitutional: Positive for malaise/fatigue. Negative for fever and chills.  Respiratory: Negative.   Cardiovascular: Negative.   Musculoskeletal: Positive for joint pain.  Neurological: Positive for dizziness (at times). Negative for weakness.    Immunization History  Administered Date(s) Administered  . Influenza, High Dose Seasonal PF 06/16/2015  . Pneumococcal Conjugate-13 02/17/2015  . Pneumococcal Polysaccharide-23 08/14/2012   Objective:  BP 118/46 mmHg  Pulse 60  Resp 12  Wt 204 lb (92.534 kg)  Physical  Exam  Constitutional: She is oriented to person, place, and time and well-developed, well-nourished, and in no distress.  HENT:  Head: Normocephalic and atraumatic.  Eyes: Conjunctivae are normal. Pupils are equal, round, and reactive to light.  Neck: Normal range of motion. Neck supple.  Cardiovascular: Normal rate, regular rhythm, normal heart sounds and intact distal pulses.   No murmur heard. Pulmonary/Chest: Effort normal and breath sounds normal. No respiratory distress.  Musculoskeletal: She exhibits tenderness. She exhibits no edema.       Left shoulder: She exhibits tenderness.  Neurological: She is alert and oriented to person, place, and time.  Psychiatric: Mood, memory, affect and judgment normal.    Lab Results  Component Value Date   WBC 7.0 07/31/2015   HGB 12.8 07/31/2015   HCT 38.0 07/31/2015   PLT 213 07/31/2015   GLUCOSE 95 08/25/2015   CHOL 155 08/25/2015   TRIG 137 08/25/2015   HDL 59 08/25/2015   LDLCALC 69 08/25/2015   TSH 2.08 12/16/2014   INR 0.95 07/30/2015   HGBA1C 5.6 06/16/2015    CMP     Component Value Date/Time   NA 134 08/25/2015 1039   NA 136 07/31/2015 0404   NA 134* 12/20/2013 0910   K 4.4 08/25/2015 1039   K 3.4* 12/24/2013 0435   CL 93* 08/25/2015 1039   CL 102 12/20/2013 0910   CO2 23 08/25/2015 1039   CO2 26 12/20/2013 0910   GLUCOSE 95 08/25/2015 1039   GLUCOSE 103* 07/31/2015 0404   GLUCOSE 119* 12/20/2013 0910   BUN 20 08/25/2015 1039   BUN 19 07/31/2015 0404   BUN 12 12/20/2013 0910   CREATININE 1.36* 08/25/2015 1039   CREATININE 1.5* 12/16/2014   CREATININE 0.91 12/24/2013 0435   CALCIUM 9.8 08/25/2015 1039   CALCIUM 7.9* 12/20/2013 0910   PROT 6.1 08/25/2015 1039   PROT 7.7 07/30/2015 1902   PROT 7.1 10/13/2013 2057   ALBUMIN 4.4 08/25/2015 1039   ALBUMIN 4.3 07/30/2015 1902   ALBUMIN 3.3* 10/13/2013 2057   AST 20 08/25/2015 1039   AST 25 10/13/2013 2057   ALT 28 08/25/2015 1039   ALT 16 10/13/2013 2057    ALKPHOS 84 08/25/2015 1039   ALKPHOS 96 10/13/2013 2057   BILITOT 0.5 08/25/2015 1039   BILITOT 0.7 07/30/2015 1902   BILITOT 0.5 10/13/2013 2057   GFRNONAA 37* 08/25/2015 1039   GFRNONAA >60 12/24/2013 0435   GFRAA 43* 08/25/2015 1039   GFRAA >60 12/24/2013 0435    Assessment and Plan :  1. Hypercholesteremia Take CoQ10 1 tablet each time you take Zocor 1/2 tablet, if with daily use still having joint pain can try to take medication every other day. Rather have patient's cholesterol been treated then not. May need to change current regimen and will re check  labs on the next visit if she is tolerating the medication.  2. Syncope and collapse So far resolved. She had follow up with Dr. Manuella Ghazi and appointments for therapy in progress.  3. Arthralgia Could be from statin or a joint issue in itself. Follow as needed.  4. Left shoulder pain Could be a tendonitis and could be from statin.Likely arthropathy/rotator cuff syndrome but statin issue is possible. - Ambulatory referral to Orthopedic Surgery Pt intolerant to NSAIDs in past.  Miguel Aschoff MD [facility] 10/19/2015 1:37 PM

## 2015-10-19 NOTE — Patient Instructions (Signed)
You can take CoQ10 1 tablet each time you take Zocor 1/2 tablet, if with daily use still having joint pain can try to take medication every other day.

## 2015-10-27 DIAGNOSIS — M7542 Impingement syndrome of left shoulder: Secondary | ICD-10-CM | POA: Diagnosis not present

## 2015-11-02 ENCOUNTER — Ambulatory Visit: Payer: Medicare Other | Attending: Neurology

## 2015-11-02 ENCOUNTER — Encounter: Payer: Self-pay | Admitting: Physical Therapy

## 2015-11-02 VITALS — BP 145/58 | HR 63

## 2015-11-02 DIAGNOSIS — R262 Difficulty in walking, not elsewhere classified: Secondary | ICD-10-CM

## 2015-11-02 DIAGNOSIS — R42 Dizziness and giddiness: Secondary | ICD-10-CM | POA: Insufficient documentation

## 2015-11-02 NOTE — Therapy (Signed)
[place] MAIN [facility] SERVICES [address] [place], [place], [address] Phone: [phone]   Fax:  [phone]  Physical Therapy Evaluation  Patient Details  Name: Dawn [name] MRN: [id number] Date of Birth: 01/24/1934 Referring Provider: Jennings Books  Encounter Date: 11/02/2015      PT End of Session - 11/02/15 1052    Visit Number 1   Number of Visits 7   Date for PT Re-Evaluation 2016/01/03   Authorization Type g codes every 10th visit/30 days   PT Start Time 1100   PT Stop Time 1215   PT Time Calculation (min) 75 min   Equipment Utilized During Treatment Gait belt   Activity Tolerance Patient tolerated treatment well   Behavior During Therapy [facility] for tasks assessed/performed      Past Medical History  Diagnosis Date  . Arthritis   . Hypertension   . Stomach ulcer   . Acid reflux   . Gastric polyp 2014  . Stroke ([place])   . Back pain     Past Surgical History  Procedure Laterality Date  . Upper gi endoscopy  2011  . Vascular surgery Right 2010    right rental artery   . Cardiac catheterization  2007    normal, [employer] [facility] [place]   . Colonoscopy  2011, 2015    Dr. Nicolasa Ducking, Dr Melody Comas  . Cataract surgery   2013  . Laminectomy  2011    decompression   . Back surgery  2011    vertebroplasty   . Carpal tunnel release  2007  . Colon surgery  2015    lap sigmoid resection    Filed Vitals:   11/02/15 1104  BP: 145/58  Pulse: 63  SpO2: 99%    Visit Diagnosis:  Dizziness and giddiness - Plan: PT plan of care cert/re-cert  Difficulty walking - Plan: PT plan of care cert/re-cert    VESTIBULAR AND BALANCE EVALUATION  Onset Date:   HISTORY:  Subjective history of current problem: Pt arrives with complaints of bumping into walls, doors, and general unsteadiness. She describes the symptoms as feeling "lightheaded" however denies any true vertigo. Pt reports that the symptoms have existed for years and pt  unable to determine the exact onset. Pt unclear what brings on her symptoms but states that they occur with position changes sometimes. She reports a syncopal episode on 07/30/15 while she was bringing her groceries into the house. Pt is unsure how long she lost consciousness but believes that it was less than 15 minutes. Pt was admitted to [facility] for observation and work-up. The brain MRI during admission showed: no acute intracranial process, specifically no acute ischemia. Moderate white matter changes/chronic small vessel ischemic disease, mildly progressed from prior examination. Old RIGHT thalamus, LEFT basal ganglia lacunar infarcts. Pt reports chronic L hand numbness from CVA 2 years ago. The discharge summary from [facility] is copied below for reference.   Discharge Summary from [facility]: 82 year old female with past medical history of hypertension, previous CVA, GERD, osteoarthritis, back pain who presented to the hospital with a syncopal episode. #1 syncope-the exact etiology of syncope is unclear. Given patient's history of previous CVA she was admitted overnight and underwent an extensive workup including a CT head, MRI of the brain which were essentially negative for any acute neurologic source. -Patient also had a carotid duplex which showed no hemodynamically significant carotid artery stenosis, and an echocardiogram which showed normal ejection fraction. -She was also observed on telemetry  and had no further evidence of acute arrhythmias. Clinically she was doing well and therefore discharged home with close follow-up with her primary care physician. -Patient may benefit from a loop/event monitor to be placed as an outpatient.  Description of dizziness: (vertigo, unsteadiness, lightheadedness, falling, general unsteadiness, aural fullness): unsteadiness, lightheadedness Frequency: Once every 2-3 months. Duration: "All day" Symptom nature: (motion provoked/positional/spontaneous/constant, variable,  intermittent): Never spontaneous, can be motion provoked.   Provocative Factors: Position changes otherwise pt is unsure Easing Factors: Rest  Progression of symptoms: (better, worse, no change since onset): unchaged History of similar episodes: None reported but symptoms have been going on for years  Falls (yes/no): Yes Number of falls in past 6 months: One with syncopal episode.  Prior Functional Level: Full independent with ADLs/IADLs. Currently does not use assistive device for ambulation. Self-limited ambulation distance due to chronic back pain.   Auditory complaints (tinnitus, pain, drainage): Denies Vision (last eye exam, diplopia, recent changes): Denies, last eye exam approximately 1 year ago without changes in prescription.   Current Symptoms: syncope 07/30/15, lightheadedness, headaches, veering, imbalance; Denies: vertigo, N & V, dysarthria, dysphagia, drop attacks, bowel and bladder changes, recent weight loss/gain, oscillopsia, migraines) Review of systems negative for red flags: negative  EXAMINATION  POSTURE: Mild forward head posture otherwise not gross abnormalities noted in sitting.   NEUROLOGICAL SCREEN: (2+ unless otherwise noted.) N=normal  Ab=abnormal  Level Dermatome R L Myotome R L Reflex R L  C3 Anterior Neck N N Sidebend C2-3 N N Jaw CN V    C4 Top of Shoulder N N Shoulder Shrug C4 N N Hoffman's [facility] A A  C5 Lateral Upper Arm N N Shoulder ABD C4-5 N N Biceps C5-6 N N  C6 Lateral Arm/ Thumb N N Arm Flex/ Wrist Ext C5-6 N N Brachiorad. C5-6 N N  C7 Middle Finger N N Arm Ext//Wrist Flex C6-7 N N Triceps C7    C8 4th & 5th Finger N N Flex/ Ext Carpi Ulnaris C8 N N Patella 3+ 2+  T1 Medial Arm N N Interossei T1 N N Gastrocnemius 2+ 2+  L2 Medial thigh/groin N N Illiopsoas (L2-3) N N     L3 Lower thigh/med.knee N N Quadriceps (L3-4) N N     L4 Medial leg/lat thigh N N Tibialis Ant (L4-5) N N     L5 Lat. leg & dorsal foot N N EHL (L5) N N     S1 post/lat  foot/thigh/leg N N Gastrocnemius (S1-2) N N Gastrocnemius (S1) N N  S2 Post./med. thigh & leg N N Hamstrings (L4-S3) N N Babinski N A   Positive Babinski LLE  Pt reports tingling sensation from dorsal L forearm near elbow down to wrist. However reports intact light touch sensation with testing. This is chronic s/p CVA 2 years ago.   SOMATOSENSORY:         Sensation           Intact      Diminished         Absent  Light touch N    Any N & T in extremities or weakness: Pt denies any diminished light touch in LUE despite tingling s/p CVA 2 years ago.       COORDINATION: Finger to Nose: Mildly dysmetric with LUE (chronic), normal RUE  Pronator Drift: Normal Heel to shin: Normal   MUSCULOSKELETAL SCREEN: Cervical Spine ROM: Mild age related loss of AROM in all planes, painless in all direction   ROM:  Mild pain with AROM of L shoulder (chronic). Pt recently received steroid injection. Otherwise WNL.   MMT: At least 4 to 4+/5 throughout. Symmetrical without focal deficits.   Functional Mobility: Pt independent with transfer although slight increase in time required to come to standing.   Gait: Scanning of visual environment with gait is: fair. Pt does not demonstrate significant head turns with ambualtion.   Balance:   POSTURAL CONTROL TESTS:   Clinical Test of Sensory Interaction for Balance    (CTSIB):  CONDITION TIME STRATEGY SWAY  Eyes open, firm surface 30 Ankle 1+  Eyes closed, firm surface 30 Ankle/hip 2+  Eyes open, foam surface 30 Ankle/hip 2+  Eyes closed, foam surface Trial 1: 3 seconds, Trial 2: 19 seconds Hip 3+    OCULOMOTOR / VESTIBULAR TESTING:  Oculomotor Exam- Room Light  Normal Abnormal Comments  Ocular Alignment N  Normal cover, cover cross cover testing  Ocular ROM N  Mild decrease in convergence  Spontaneous Nystagmus N    End-Gaze Nystagmus N  Age appropriate end gaze  Smooth Pursuit N    Saccades  A Saccadic with horizontal R gaze when returning  to midline. Pt takes multiple saccades to hit targed. To the L is normal.   VOR N    VOR Cancellation  A Pt reports some blurring of object, dizziness after termination  Left Head Thrust  A Corrective saccade, 2/2 testing  Right Head Thrust  A Corrective saccade, 2/2 testing  Head Shaking Nystagmus N    Static Acuity     Dynamic Acuity       Oculomotor Exam- Fixation Suppressed  Normal Abnormal Comments  Ocular Alignment N    Ocular ROM N    Spontaneous Nystagmus N    End-Gaze Nystagmus N    Left Head Thrust     Right Head Thrust     Head Shaking Nystagmus       BPPV TESTS:  Symptoms Duration Intensity Nystagmus  L Dix-Hallpike None   None  R Dix-Hallpike None   None  L Head Roll None   None  R Head Roll None   None  L Sidelying Test      R Sidelying Test        FUNCTIONAL OUTCOME MEASURES:  Results Comments  DHI 28/100   ABC Scale 35.625% Below cut-off  DGI 12/24 Increased fall risk  10 meter Walking Speed 0.57 m/s Below norms for limited community ambulation                       [facility] PT Assessment - 11/02/15 0001    Assessment   Medical Diagnosis Dizziness   Referring Provider Manuella Ghazi, [place]   Onset Date/Surgical Date 07/30/15  This is date of syncope. Dizziness for multiple years   Next MD Visit Not reported   Prior Therapy No   Precautions   Precautions Fall   Restrictions   Weight Bearing Restrictions No   Balance Screen   Has the patient fallen in the past 6 months Yes   How many times? Once   [place] residence   Living Arrangements Alone   Available Help at Discharge Family   Type of [place] to enter   Entrance Stairs-Number of Steps 6   Entrance Stairs-Rails Right   [place] One level   [place] - single point   Prior Function   Level  of Independence Independent;Independent with community mobility without device;Independent with basic ADLs;Independent  with homemaking with ambulation;Independent with transfers;Independent with gait   Vocation Retired   Charity fundraiser Status Within Functional Limits for tasks assessed   Observation/Other Assessments   Other Surveys  Other Surveys   Activities of Balance Confidence Scale (ABC Scale)  35.625   Dizziness Handicap Inventory (DHI)  28/100   Standardized Balance Assessment   Standardized Balance Assessment Dynamic Gait Index;10 meter walk test   10 Meter Walk 17.34=0.58 m/s   Dynamic Gait Index   Level Surface Mild Impairment   Change in Gait Speed Mild Impairment   Gait with Horizontal Head Turns Moderate Impairment   Gait with Vertical Head Turns Moderate Impairment   Gait and Pivot Turn Mild Impairment   Step Over Obstacle Moderate Impairment   Step Around Obstacles Mild Impairment   Steps Moderate Impairment   Total Score 12   High Level Balance   High Level Balance Comments Single leg balance approximately 2 seconds      TREATMENT  Pt issued VOR x 1 horizontal x 60 seconds for HEP. Performed x 2 with patient to ensure proper technique. Pt also issued semi-tandem progressions with horizontal head turns. Performed 30 seconds x 2. Verbal and tactile cues provided for correction with both exercises.                      PT Education - 11/02/15 1051    Education provided Yes   Education Details HEP, plan of care   Person(s) Educated Patient   Methods Explanation;Demonstration;Handout   Comprehension Verbalized understanding;Returned demonstration             PT Long Term Goals - 11/02/15 1536    PT LONG TERM GOAL #1   Title Pt will be independent with HEP for self-management of symptoms and to maintain progress after therapy ends   Time 6   Period Weeks   Status New   PT LONG TERM GOAL #2   Title Pt will demonstrate improvement in gait speed to at least 0.7 m/s (0.22m/s MCID) for improved limited community mobility   Baseline 11/02/15:  0.57 m/s   Time 6   Period Weeks   Status New   PT LONG TERM GOAL #3   Title Pt will improve ABC to greater than 67% in order to demonstrates balance confidence above cut-off for increased fall risk    Baseline 11/02/15: 35.625%   Time 6   Period Weeks   Status New   PT LONG TERM GOAL #4   Title Pt will improve DGI by at least 3 points in order to demonstrate improvement in balance and decrease risk for falls   Baseline 11/02/15: 12/24   Time 6   Period Weeks   Status New               Plan - 11/02/15 1052    Clinical Impression Statement Pt is a pleasant 82 year-old female referred for dizziness by neurologist. PT evaluation on this date demonstrates combination of central vs peripheral findings explaining patient's subjective reports of dizziness. Overwhelmingly patient's descriptions of her symptoms are more consistent with general imbalance than true dizziness. However she does demonstrate a positive head thrust test bilaterally as well as difficulty with head turns during ambualtion. DGI: 12/24 which indicates increased risk for future falls. ABC is 35.675% which is below cut-off of 67% for predicting future falls. Marye Round and horizontal  canal testing are negative for BPPV and pt does not complain of any vertigo symptoms on this date or prior. Gait speed is decreased at 0.57 m/s which also indicates increased limitations in community ambulation. Pt would benefit from skilled PT services to address deficits in gait, balance, and vestibular function in order to decrease risk for falls and improve function at home.    Pt will benefit from skilled therapeutic intervention in order to improve on the following deficits Abnormal gait;Decreased balance;Decreased mobility;Dizziness;Difficulty walking   Rehab Potential Good   Clinical Impairments Affecting Rehab Potential Positive: motivation; Negative: chronicity, chronic CVA   PT Frequency 1x / week   PT Duration 6 weeks   PT  Treatment/Interventions Canalith Repostioning;Aquatic Therapy;DME Instruction;Gait training;Stair training;Therapeutic activities;Therapeutic exercise;Balance training;Neuromuscular re-education;Patient/family education;Manual techniques;Vestibular   PT Next Visit Plan Perform BERG, 5TSTS, TUG, review and progress VOR x 1 horizonatal, ontinue to semitandem progressions, consider adding single leg balance to HEP;   PT Home Exercise Plan Semitandem progression with head turns, VOR x 1 horizontal   Consulted and Agree with Plan of Care Patient          G-Codes - 11-20-2015 1543    Functional Assessment Tool Used clinical judgement, 22m gait speed, DGI, mCTSIB, ABC, DHI   Functional Limitation Mobility: Walking and moving around   Mobility: Walking and Moving Around Current Status [phone]) At least 40 percent but less than 60 percent impaired, limited or restricted   Mobility: Walking and Moving Around Goal Status [phone]) At least 20 percent but less than 40 percent impaired, limited or restricted       Problem List Patient Active Problem List   Diagnosis Date Noted  . Carotid artery narrowing 08/09/2015  . Syncope and collapse 07/30/2015  . Allergic rhinitis 01/05/2015  . Cerebral infarct ([place]) 01/05/2015  . Accelerated essential hypertension 01/05/2015  . Benign gastric polyp 01/05/2015  . Hypercholesteremia 01/05/2015  . Neuropathy ([place]) 01/05/2015  . Adiposity 01/05/2015  . Arthritis of knee, degenerative 01/05/2015  . Borderline diabetes 01/05/2015  . Temporary cerebral vascular dysfunction 01/05/2015  . GERD (gastroesophageal reflux disease) 03/31/2013  . Gastric polyp    Phillips Grout PT, DPT   Huprich,Jason 2015/11/20, 4:16 PM  [place] MAIN [facility] SERVICES [address] [place], [place], [address] Phone: [phone]   Fax:  [phone]  Name: Dawn [name] MRN: [id number] Date of Birth: 08-Oct-1933

## 2015-11-02 NOTE — Patient Instructions (Signed)
Gaze Stabilization: Sitting    Keeping eyes on target on wall an arms length away, tilt head down 15-30 and move head side to side for _60___ seconds. KEEP EYES FOCUSED ON TARGET. Perform as fast as you can without object becoming blurring, double, or moving. If this happens slow down speed. Exercise may cause dizziness. If it lasts longer than 20 minutes after end of exercise slow down next time. Perform 2 more times for a total of 3, 60 second bouts. Perform 4 times a day.   Feet Heel-Toe "Tandem", Head Motion - Eyes Open    With eyes open, right foot in front of the other but out to the side. (wider stance). Move head slowly left and right for 30 seconds. Next place left foot forward and repeat for 30 seconds. Perform 2 times with each foot forward. Perform 4 times/day.

## 2015-11-10 ENCOUNTER — Ambulatory Visit: Payer: Medicare Other | Attending: Neurology

## 2015-11-10 VITALS — BP 158/104 | HR 67

## 2015-11-10 DIAGNOSIS — R262 Difficulty in walking, not elsewhere classified: Secondary | ICD-10-CM

## 2015-11-10 DIAGNOSIS — R42 Dizziness and giddiness: Secondary | ICD-10-CM

## 2015-11-10 NOTE — Therapy (Signed)
[place] MAIN [facility] SERVICES [address] [place], [place], [address] Phone: [phone]   Fax:  [phone]  Physical Therapy Treatment  Patient Details  Name: Dawn [name] MRN: [id number] Date of Birth: 03-29-1934 Referring Provider: Jennings Books  Encounter Date: 11/10/2015      PT End of Session - 11/10/15 1415    Visit Number 2   Number of Visits 7   Date for PT Re-Evaluation 13-Jan-2016   Authorization Type g codes every 10th visit/30 days   PT Start Time 1415   PT Stop Time 1500   PT Time Calculation (min) 45 min   Equipment Utilized During Treatment Gait belt   Activity Tolerance Patient tolerated treatment well   Behavior During Therapy [facility] for tasks assessed/performed      Past Medical History  Diagnosis Date  . Arthritis   . Hypertension   . Stomach ulcer   . Acid reflux   . Gastric polyp 2014  . Stroke ([place])   . Back pain     Past Surgical History  Procedure Laterality Date  . Upper gi endoscopy  2011  . Vascular surgery Right 2010    right rental artery   . Cardiac catheterization  2007    normal, [employer] [facility] [place]   . Colonoscopy  2011, 2015    Dr. Nicolasa Ducking, Dr Melody Comas  . Cataract surgery   2013  . Laminectomy  2011    decompression   . Back surgery  2011    vertebroplasty   . Carpal tunnel release  2007  . Colon surgery  2015    lap sigmoid resection    Filed Vitals:   11/10/15 1419  BP: 158/104  Pulse: 67  SpO2: 100%    Visit Diagnosis:  Dizziness and giddiness  Difficulty walking      Subjective Assessment - 11/10/15 1414    Subjective Pt states she has a headache on this date. Her HEP went "slowly," but she was able to complete a few times. Reports some dizziness with exercises. No specific questions or concerns on this date.    Pertinent History Pt arrives with complaints of bumping into walls, doors, and general unsteadiness. She describes the symptoms as feeling  "lightheaded" however denies any true vertigo. Pt reports that the symptoms have existed for years and pt unable to determine the exact onset. Pt unclear what brings on her symptoms but states that they occur with position changes sometimes. She reports a syncopal episode on 07/30/15 while she was bringing her groceries into the house. Pt is unsure how long she lost consciousness but believes that it was less than 15 minutes. Pt was admitted to [facility] for observation and work-up. The brain MRI during admission showed: no acute intracranial process, specifically no acute ischemia. Moderate white matter changes/chronic small vessel ischemic disease, mildly progressed from prior examination. Old RIGHT thalamus, LEFT basal ganglia lacunar infarcts. Pt reports chronic L hand numbness from CVA 2 years ago. The discharge summary from [facility] is copied below for reference.    Currently in Pain? Yes   Pain Score 5    Pain Location Head   Pain Orientation Left   Pain Onset Yesterday   Pain Frequency Intermittent   Multiple Pain Sites Yes   Pain Score 4   Pain Location Back   Pain Orientation Lower   Pain Type Chronic pain   Pain Frequency Intermittent  [facility] PT Assessment - 11/10/15 1431    Standardized Balance Assessment   Standardized Balance Assessment Berg Balance Test;Five Times Sit to Stand;Timed Up and Go Test   Five times sit to stand comments  29.8 seconds   Berg Balance Test   Sit to Stand Able to stand without using hands and stabilize independently   Standing Unsupported Able to stand safely 2 minutes   Sitting with Back Unsupported but Feet Supported on Floor or Stool Able to sit safely and securely 2 minutes   Stand to Sit Sits safely with minimal use of hands   Transfers Able to transfer safely, minor use of hands   Standing Unsupported with Eyes Closed Able to stand 10 seconds safely   Standing Ubsupported with Feet Together Able to place feet together independently and stand  1 minute safely   From Standing, Reach Forward with Outstretched Arm Can reach confidently >25 cm (10")   From Standing Position, Pick up Object from Floor Able to pick up shoe safely and easily   From Standing Position, Turn to Look Behind Over each Shoulder Looks behind from both sides and weight shifts well   Turn 360 Degrees Able to turn 360 degrees safely in 4 seconds or less   Standing Unsupported, Alternately Place Feet on Step/Stool Able to complete 4 steps without aid or supervision   Standing Unsupported, One Foot in Front Able to plae foot ahead of the other independently and hold 30 seconds   Standing on One Leg Tries to lift leg/unable to hold 3 seconds but remains standing independently   Total Score 50   Berg comment: Decreased single leg and tandem balance   Timed Up and Go Test   TUG Normal TUG   Normal TUG (seconds) 16.2      TREATMENT Upon arrival pt with elevated BP 158/104. Pt placed in supine position with lights dimmed for 5 minutes and then BP repeated manually: 174/68  PHYSICAL PERFORMANCE Completed BERG, 5TSTS, and TUG with patient;  NEUROMUSCULAR RE-EDUCATION Semi-tandem Balance Performed semi-tandem balance alternating forward foot with horizontal head turns x 30 seconds (0/10), horizontal head/body turns (6/10 dizziness) x 30 seconds, and vertical head turns (2/10)x 30 seconds each;  Single Leg Balance Performed single leg balance in // bars for total of 30 seconds on each leg. Issued written HEP and added to program.   VOR Reviewed VOR x 1 horizontal with patient and performed 60 seconds, Pt requires verbal and tactile cues for correction. Pt is rotating head too far and allowing eyes to deviate especially with head turns to the R. Repeated for 60 seconds with manual assist to increase speed. Pt reports no blurring, doubling or shifting of "X" but complains of "purple lavendar post-it notes around the central yellow focal point." Pt complains of 3/10  dizziness which persists for approximately 2.5 minutes during which time pt reports it "feels like my head is still moving." Eventually symptoms resolve.                           PT Education - 11/10/15 1520    Education provided Yes   Education Details HEP progression   Person(s) Educated Patient   Methods Explanation;Demonstration;Tactile cues;Verbal cues;Handout   Comprehension Verbalized understanding;Returned demonstration;Verbal cues required;Tactile cues required             PT Long Term Goals - 11/02/15 1536    PT LONG TERM GOAL #1   Title Pt  will be independent with HEP for self-management of symptoms and to maintain progress after therapy ends   Time 6   Period Weeks   Status New   PT LONG TERM GOAL #2   Title Pt will demonstrate improvement in gait speed to at least 0.7 m/s (0.50m/s MCID) for improved limited community mobility   Baseline 11/02/15: 0.57 m/s   Time 6   Period Weeks   Status New   PT LONG TERM GOAL #3   Title Pt will improve ABC to greater than 67% in order to demonstrates balance confidence above cut-off for increased fall risk    Baseline 11/02/15: 35.625%   Time 6   Period Weeks   Status New   PT LONG TERM GOAL #4   Title Pt will improve DGI by at least 3 points in order to demonstrate improvement in balance and decrease risk for falls   Baseline 11/02/15: 12/24   Time 6   Period Weeks   Status New               Plan - 11/10/15 1415    Clinical Impression Statement Pt reports dizziness with VOR and head turning activites on this date. She demonstrates poor performance with VOR x 1 horizontal exercise requiring mod to max verbal and tactile cues. Pt also with increased time required to perform 5TSTS and TUG indicating decreased LE power and increased fall risk. BERG score of 50/56 with difficulty performing tandem and single leg balance. HEP progressed and pt advised to follow-up as scheduled.    Pt will benefit from  skilled therapeutic intervention in order to improve on the following deficits Abnormal gait;Decreased balance;Decreased mobility;Dizziness;Difficulty walking   Rehab Potential Good   Clinical Impairments Affecting Rehab Potential Positive: motivation; Negative: chronicity, chronic CVA   PT Frequency 1x / week   PT Duration 6 weeks   PT Treatment/Interventions Canalith Repostioning;Aquatic Therapy;DME Instruction;Gait training;Stair training;Therapeutic activities;Therapeutic exercise;Balance training;Neuromuscular re-education;Patient/family education;Manual techniques;Vestibular   PT Next Visit Plan Review VOR again and progress as indicated. Attempt ball passes. Attempt ambulation with head turns such as card reading in hallway.    PT Home Exercise Plan Semitandem progression with head turns and head/body turns, VOR x 1 horizontal in sitting, single leg balance   Consulted and Agree with Plan of Care Patient        Problem List Patient Active Problem List   Diagnosis Date Noted  . Carotid artery narrowing 08/09/2015  . Syncope and collapse 07/30/2015  . Allergic rhinitis 01/05/2015  . Cerebral infarct ([place]) 01/05/2015  . Accelerated essential hypertension 01/05/2015  . Benign gastric polyp 01/05/2015  . Hypercholesteremia 01/05/2015  . Neuropathy ([place]) 01/05/2015  . Adiposity 01/05/2015  . Arthritis of knee, degenerative 01/05/2015  . Borderline diabetes 01/05/2015  . Temporary cerebral vascular dysfunction 01/05/2015  . GERD (gastroesophageal reflux disease) 03/31/2013  . Gastric polyp    Phillips Grout PT, DPT   Niasia Lanphear 11/10/2015, 3:32 PM  [place] MAIN [facility] SERVICES [address] [place], [place], [address] Phone: [phone]   Fax:  [phone]  Name: Dawn [name] MRN: [id number] Date of Birth: 1934-03-05

## 2015-11-10 NOTE — Patient Instructions (Addendum)
Single Leg - Eyes Open    Holding support, lift right leg while maintaining balance over other leg. Progress to removing hands from support surface for longer periods of time. Goal is to be able to stay on one leg for >10 seconds. Perform for a total of 30 seconds on each leg (may be in 3-5 second bouts). Perform 3 times on each leg. (30 seconds x 3 on each leg).  Do _3___ sessions per day.

## 2015-11-15 ENCOUNTER — Ambulatory Visit: Payer: Medicare Other | Admitting: Family Medicine

## 2015-11-15 DIAGNOSIS — Z961 Presence of intraocular lens: Secondary | ICD-10-CM | POA: Diagnosis not present

## 2015-11-17 ENCOUNTER — Ambulatory Visit: Payer: Medicare Other

## 2015-11-17 VITALS — BP 137/55 | HR 58

## 2015-11-17 DIAGNOSIS — R42 Dizziness and giddiness: Secondary | ICD-10-CM | POA: Diagnosis not present

## 2015-11-17 DIAGNOSIS — R262 Difficulty in walking, not elsewhere classified: Secondary | ICD-10-CM | POA: Diagnosis not present

## 2015-11-17 NOTE — Therapy (Signed)
[place] MAIN [facility] SERVICES [address] [place], [place], [address] Phone: [phone]   Fax:  [phone]  Physical Therapy Treatment  Patient Details  Name: Dawn [name] MRN: [id number] Date of Birth: 10-14-1933 Referring Provider: Jennings Books  Encounter Date: 11/17/2015      PT End of Session - 11/17/15 1455    Visit Number 3   Number of Visits 7   Date for PT Re-Evaluation 2015-12-30   Authorization Type g codes every 10th visit/30 days   PT Start Time 1415   PT Stop Time 1500   PT Time Calculation (min) 45 min   Equipment Utilized During Treatment Gait belt   Activity Tolerance Patient tolerated treatment well   Behavior During Therapy [facility] for tasks assessed/performed      Past Medical History  Diagnosis Date  . Arthritis   . Hypertension   . Stomach ulcer   . Acid reflux   . Gastric polyp 2014  . Stroke ([place])   . Back pain     Past Surgical History  Procedure Laterality Date  . Upper gi endoscopy  2011  . Vascular surgery Right 2010    right rental artery   . Cardiac catheterization  2007    normal, [employer] [facility] [place]   . Colonoscopy  2011, 2015    Dr. Nicolasa Ducking, Dr Melody Comas  . Cataract surgery   2013  . Laminectomy  2011    decompression   . Back surgery  2011    vertebroplasty   . Carpal tunnel release  2007  . Colon surgery  2015    lap sigmoid resection    Filed Vitals:   11/17/15 1418  BP: 137/55  Pulse: 58  SpO2: 99%    Visit Diagnosis:  Difficulty walking  Dizziness and giddiness      Subjective Assessment - 11/17/15 1426    Subjective Pt reports that over the weekend she had some soreness in her neck which limited activity. She has been performing HEP and notes that VOR exercise brings on her dizziness. She has been checking her BP at home and reports it has been mildly elevated but reasonable. Pt states that on Monday she went for her vision check and no changes were made to her  prescription. No specific questions at this time.   Pertinent History Pt arrives with complaints of bumping into walls, doors, and general unsteadiness. She describes the symptoms as feeling "lightheaded" however denies any true vertigo. Pt reports that the symptoms have existed for years and pt unable to determine the exact onset. Pt unclear what brings on her symptoms but states that they occur with position changes sometimes. She reports a syncopal episode on 07/30/15 while she was bringing her groceries into the house. Pt is unsure how long she lost consciousness but believes that it was less than 15 minutes. Pt was admitted to [facility] for observation and work-up. The brain MRI during admission showed: no acute intracranial process, specifically no acute ischemia. Moderate white matter changes/chronic small vessel ischemic disease, mildly progressed from prior examination. Old RIGHT thalamus, LEFT basal ganglia lacunar infarcts. Pt reports chronic L hand numbness from CVA 2 years ago. The discharge summary from [facility] is copied below for reference.    Currently in Pain? No/denies         NEUROMUSCULAR RE-EDUCATION  VOR VOR x 1 horizontal in standing 60 seconds x 3 (4/10 dizziness), Pt complains of some increased L lateral shoulder pain  and is unsure if it is related. Pt instructed to decrease cervical rotation ROM. Pt also requires cues for increased speed. Pt reports no blurring, doubling or shifting of "X."   Semi-tandem Balance Performed semi-tandem balance alternating forward foot with horizontal head turns x 30 seconds, horizontal head/body turns x 30 seconds, and vertical head turns x 30 seconds each; Full tandem balance with horizontal head turns alternating forward foot x 30 seconds each;  Single Leg Balance Attempted single leg balance but pt limited today due to cramps in LLE in both stance and when lifted, terminated for today;  Hallway Ambulation Ambulation in hallway with horizontal  ball pass to self with head and eye follow 75' x 2, pt with 2 losses of balance requiring modA+1 therapist correction to prevent falls. Pt requires seated rest break after each length of hallway. Reports increase in back pain so no further ambulation exercises performed on this date.  No dizziness reported  Ball Toss to Self Airex feet apart vertical ball toss to self 60 seconds x 2; Airex feet together vertical ball toss to self x 60 seconds; Denies dizziness but reports "imbalance"  Tandem Gait Performed tandem gait in // bars 4' x 4 with intermittent support;                         PT Education - 11/17/15 1454    Education provided Yes   Education Details HEP progression and importance   Person(s) Educated Patient   Methods Explanation   Comprehension Verbalized understanding             PT Long Term Goals - 11/02/15 1536    PT LONG TERM GOAL #1   Title Pt will be independent with HEP for self-management of symptoms and to maintain progress after therapy ends   Time 6   Period Weeks   Status New   PT LONG TERM GOAL #2   Title Pt will demonstrate improvement in gait speed to at least 0.7 m/s (0.24m/s MCID) for improved limited community mobility   Baseline 11/02/15: 0.57 m/s   Time 6   Period Weeks   Status New   PT LONG TERM GOAL #3   Title Pt will improve ABC to greater than 67% in order to demonstrates balance confidence above cut-off for increased fall risk    Baseline 11/02/15: 35.625%   Time 6   Period Weeks   Status New   PT LONG TERM GOAL #4   Title Pt will improve DGI by at least 3 points in order to demonstrate improvement in balance and decrease risk for falls   Baseline 11/02/15: 12/24   Time 6   Period Weeks   Status New               Plan - 11/17/15 1456    Clinical Impression Statement Pt demonstrates increased lateral gait deviations with horizontal head turns during ambulation. She requires seated rest breaks throughout  session due to increase in low back pain and fatigue. Session is somewhat limited today due to fatigue and back pain. Progressed HEP and encouraged compliance. Pt encouraged to follow-up as scheduled.    Pt will benefit from skilled therapeutic intervention in order to improve on the following deficits Abnormal gait;Decreased balance;Decreased mobility;Dizziness;Difficulty walking   Rehab Potential Good   Clinical Impairments Affecting Rehab Potential Positive: motivation; Negative: chronicity, chronic CVA   PT Frequency 1x / week   PT Duration 6 weeks  PT Treatment/Interventions Canalith Repostioning;Aquatic Therapy;DME Instruction;Gait training;Stair training;Therapeutic activities;Therapeutic exercise;Balance training;Neuromuscular re-education;Patient/family education;Manual techniques;Vestibular   PT Next Visit Plan Review VOR again and progress as indicated. Attempt ball passes. Attempt ambulation with head turns such as card reading in hallway.    PT Home Exercise Plan Tandem progression with head turns and head/body turns, VOR x 1 horizontal feet together in standing, single leg balance   Consulted and Agree with Plan of Care Patient        Problem List Patient Active Problem List   Diagnosis Date Noted  . Carotid artery narrowing 08/09/2015  . Syncope and collapse 07/30/2015  . Allergic rhinitis 01/05/2015  . Cerebral infarct ([place]) 01/05/2015  . Accelerated essential hypertension 01/05/2015  . Benign gastric polyp 01/05/2015  . Hypercholesteremia 01/05/2015  . Neuropathy ([place]) 01/05/2015  . Adiposity 01/05/2015  . Arthritis of knee, degenerative 01/05/2015  . Borderline diabetes 01/05/2015  . Temporary cerebral vascular dysfunction 01/05/2015  . GERD (gastroesophageal reflux disease) 03/31/2013  . Gastric polyp    Phillips Grout PT, DPT   Toini Failla 11/17/2015, 3:33 PM  [place] MAIN [facility] SERVICES [address]  [place], [place], [address] Phone: [phone]   Fax:  [phone]  Name: Dawn [name] MRN: [id number] Date of Birth: [birth year]

## 2015-11-24 ENCOUNTER — Ambulatory Visit: Payer: Medicare Other

## 2015-11-24 ENCOUNTER — Encounter: Payer: Self-pay | Admitting: Physical Therapy

## 2015-11-24 VITALS — BP 146/41 | HR 60

## 2015-11-24 DIAGNOSIS — R262 Difficulty in walking, not elsewhere classified: Secondary | ICD-10-CM | POA: Diagnosis not present

## 2015-11-24 DIAGNOSIS — R42 Dizziness and giddiness: Secondary | ICD-10-CM

## 2015-11-24 NOTE — Therapy (Signed)
[place] MAIN [facility] SERVICES [address] [place], [place], [address] Phone: [phone]   Fax:  [phone]  Physical Therapy Treatment  Patient Details  Name: Dawn [name] MRN: [id number] Date of Birth: 1934/07/27 Referring Provider: Jennings Books  Encounter Date: 11/24/2015      PT End of Session - 11/24/15 1425    Visit Number 4   Number of Visits 7   Date for PT Re-Evaluation 12-28-2015   Authorization Type g codes every 10th visit/30 days   PT Start Time 1420   PT Stop Time 1505   PT Time Calculation (min) 45 min   Equipment Utilized During Treatment Gait belt   Activity Tolerance Patient tolerated treatment well   Behavior During Therapy [facility] for tasks assessed/performed      Past Medical History  Diagnosis Date  . Arthritis   . Hypertension   . Stomach ulcer   . Acid reflux   . Gastric polyp 2014  . Stroke ([place])   . Back pain     Past Surgical History  Procedure Laterality Date  . Upper gi endoscopy  2011  . Vascular surgery Right 2010    right rental artery   . Cardiac catheterization  2007    normal, [employer] [facility] [place]   . Colonoscopy  2011, 2015    Dr. Nicolasa Ducking, Dr Melody Comas  . Cataract surgery   2013  . Laminectomy  2011    decompression   . Back surgery  2011    vertebroplasty   . Carpal tunnel release  2007  . Colon surgery  2015    lap sigmoid resection    Filed Vitals:   11/24/15 1420  BP: 146/41  Pulse: 60  SpO2: 99%    Visit Diagnosis:  Difficulty walking  Dizziness and giddiness      Subjective Assessment - 11/24/15 1414    Subjective Pt states that she is not having any soreness in her neck currently. Exercises are going well at home and pt reports she has been performing HEP "some." Pt has not noticed any improvement in her balance or "dizzy spells" since starting therapy. Pt denies any "dizzy spells" since last seeing therapy. No specific questions or concerns at this time.     Pertinent History Pt arrives with complaints of bumping into walls, doors, and general unsteadiness. She describes the symptoms as feeling "lightheaded" however denies any true vertigo. Pt reports that the symptoms have existed for years and pt unable to determine the exact onset. Pt unclear what brings on her symptoms but states that they occur with position changes sometimes. She reports a syncopal episode on 07/30/15 while she was bringing her groceries into the house. Pt is unsure how long she lost consciousness but believes that it was less than 15 minutes. Pt was admitted to [facility] for observation and work-up. The brain MRI during admission showed: no acute intracranial process, specifically no acute ischemia. Moderate white matter changes/chronic small vessel ischemic disease, mildly progressed from prior examination. Old RIGHT thalamus, LEFT basal ganglia lacunar infarcts. Pt reports chronic L hand numbness from CVA 2 years ago. The discharge summary from [facility] is copied below for reference.    Currently in Pain? No/denies       Treatment  THER-EX NuStep L2 x 4 minutes for warm-up during history (1 minute unbilled); Quantum leg press 75# x 10, 90# x 10, 105# x 10;  NEUROMUSCULAR RE-EDUCATION VOR VOR x 1 horizontal in standing 60  seconds x 1 (2/10), VOR x 1 vertical in standing 60 seconds x 1 (0000000), with conflicting background 60 seconds x 2 (1/10 dizziness), Pt also requires cues for speed adjusgment. Pt reports no blurring, doubling or shifting of "X."   Single Leg Balance Single leg balance x 30 seconds each alternating LE, performed in 10-15 second bouts, modification for non-stance foot in front instead of behind (HS curl position) to avoid HS cramping;  Airex Semi-tandem Balance Performed semi-tandem balance alternating forward foot x 30 seconds each, with horizontal head turns x 30 seconds, and vertical head turns x 30 seconds each;  Hallway Ambulation Ambulation in hallway with  horizontal ball pass to self with head and eye follow x 75', vertical x 75.' Pt with intermittent LOB requiring therapist correction. Pt requires seated rest break after each length of hallway due to fatigue and back pain. No dizziness reported                           PT Education - 11/24/15 1425    Education provided Yes   Education Details HEP reinforced and progressed   Person(s) Educated Patient   Methods Explanation   Comprehension Verbalized understanding             PT Long Term Goals - 11/02/15 1536    PT LONG TERM GOAL #1   Title Pt will be independent with HEP for self-management of symptoms and to maintain progress after therapy ends   Time 6   Period Weeks   Status New   PT LONG TERM GOAL #2   Title Pt will demonstrate improvement in gait speed to at least 0.7 m/s (0.82m/s MCID) for improved limited community mobility   Baseline 11/02/15: 0.57 m/s   Time 6   Period Weeks   Status New   PT LONG TERM GOAL #3   Title Pt will improve ABC to greater than 67% in order to demonstrates balance confidence above cut-off for increased fall risk    Baseline 11/02/15: 35.625%   Time 6   Period Weeks   Status New   PT LONG TERM GOAL #4   Title Pt will improve DGI by at least 3 points in order to demonstrate improvement in balance and decrease risk for falls   Baseline 11/02/15: 12/24   Time 6   Period Weeks   Status New               Plan - 11/24/15 1426    Clinical Impression Statement Pt reports low levels of dizziness with VOR on this date. Able to progress to conflicting background without noticeable increase in dizziness. Pt does demonstrate considerable veering with ball passes during ambulation. Pt heavily encouraged to be constent with HEP. Will repeat outcome measures and update gcodes at next visit. Follow-up as scheduled.    Pt will benefit from skilled therapeutic intervention in order to improve on the following deficits Abnormal  gait;Decreased balance;Decreased mobility;Dizziness;Difficulty walking   Rehab Potential Good   Clinical Impairments Affecting Rehab Potential Positive: motivation; Negative: chronicity, chronic CVA   PT Frequency 1x / week   PT Duration 6 weeks   PT Treatment/Interventions Canalith Repostioning;Aquatic Therapy;DME Instruction;Gait training;Stair training;Therapeutic activities;Therapeutic exercise;Balance training;Neuromuscular re-education;Patient/family education;Manual techniques;Vestibular   PT Next Visit Plan Outcome measures and update gcodes, progress VOR as able, continue ambulation with head turns and ball passes. Consider quick turns in hallway and/or retro ambulation. Continue LE strengthening   PT Home  Exercise Plan Tandem progression with head turns and head/body turns, VOR x 1 horizontal feet together in standing with conflicting background, single leg balance   Consulted and Agree with Plan of Care Patient        Problem List Patient Active Problem List   Diagnosis Date Noted  . Carotid artery narrowing 08/09/2015  . Syncope and collapse 07/30/2015  . Allergic rhinitis 01/05/2015  . Cerebral infarct ([place]) 01/05/2015  . Accelerated essential hypertension 01/05/2015  . Benign gastric polyp 01/05/2015  . Hypercholesteremia 01/05/2015  . Neuropathy ([place]) 01/05/2015  . Adiposity 01/05/2015  . Arthritis of knee, degenerative 01/05/2015  . Borderline diabetes 01/05/2015  . Temporary cerebral vascular dysfunction 01/05/2015  . GERD (gastroesophageal reflux disease) 03/31/2013  . Gastric polyp    Phillips Grout PT, DPT   Huprich,Jason 11/24/2015, 3:50 PM  [place] MAIN [facility] SERVICES [address] [place], [place], [address] Phone: [phone]   Fax:  [phone]  Name: Dawn [name] MRN: [id number] Date of Birth: 07/22/1934

## 2015-11-30 ENCOUNTER — Ambulatory Visit: Payer: Medicare Other

## 2015-11-30 VITALS — BP 127/58 | HR 62

## 2015-11-30 DIAGNOSIS — R42 Dizziness and giddiness: Secondary | ICD-10-CM | POA: Diagnosis not present

## 2015-11-30 DIAGNOSIS — R262 Difficulty in walking, not elsewhere classified: Secondary | ICD-10-CM | POA: Diagnosis not present

## 2015-11-30 NOTE — Therapy (Signed)
[place] MAIN [facility] SERVICES [address] [place], [place], [address] Phone: [phone]   Fax:  [phone]  Physical Therapy Treatment  Patient Details  Name: Dawn [name] MRN: [id number] Date of Birth: 1934/03/24 Referring Provider: Jennings Books  Encounter Date: 11/30/2015      PT End of Session - 11/30/15 1413    Visit Number 5   Number of Visits 7   Date for PT Re-Evaluation 12-20-2015   Authorization Type g codes every 10th visit/30 days   PT Start Time 1415   PT Stop Time 1500   PT Time Calculation (min) 45 min   Equipment Utilized During Treatment Gait belt   Activity Tolerance Patient tolerated treatment well   Behavior During Therapy [facility] for tasks assessed/performed      Past Medical History  Diagnosis Date  . Arthritis   . Hypertension   . Stomach ulcer   . Acid reflux   . Gastric polyp 2014  . Stroke ([place])   . Back pain     Past Surgical History  Procedure Laterality Date  . Upper gi endoscopy  2011  . Vascular surgery Right 2010    right rental artery   . Cardiac catheterization  2007    normal, [employer] [facility] [place]   . Colonoscopy  2011, 2015    Dr. Nicolasa Ducking, Dr Melody Comas  . Cataract surgery   2013  . Laminectomy  2011    decompression   . Back surgery  2011    vertebroplasty   . Carpal tunnel release  2007  . Colon surgery  2015    lap sigmoid resection    Filed Vitals:   11/30/15 1419  BP: 127/58  Pulse: 62  SpO2: 100%    Visit Diagnosis:  Difficulty walking  Dizziness and giddiness      Subjective Assessment - 11/30/15 1413    Subjective Pt states that she had a really bad headache Sunday and yesterday. She states that it has resolved currently and she does not have any pain today. Pt did not feel well enough to perform HEP yesterday due to headache. Pt does not report any improvement in dizziness or balance since starting therapy. No specific questions or concerns at this time.     Pertinent History Pt arrives with complaints of bumping into walls, doors, and general unsteadiness. She describes the symptoms as feeling "lightheaded" however denies any true vertigo. Pt reports that the symptoms have existed for years and pt unable to determine the exact onset. Pt unclear what brings on her symptoms but states that they occur with position changes sometimes. She reports a syncopal episode on 07/30/15 while she was bringing her groceries into the house. Pt is unsure how long she lost consciousness but believes that it was less than 15 minutes. Pt was admitted to [facility] for observation and work-up. The brain MRI during admission showed: no acute intracranial process, specifically no acute ischemia. Moderate white matter changes/chronic small vessel ischemic disease, mildly progressed from prior examination. Old RIGHT thalamus, LEFT basal ganglia lacunar infarcts. Pt reports chronic L hand numbness from CVA 2 years ago. The discharge summary from [facility] is copied below for reference.    Currently in Pain? No/denies            [facility] PT Assessment - 11/30/15 1425    Observation/Other Assessments   Other Surveys  Other Surveys   Activities of Balance Confidence Scale (ABC Scale)  46.25%   Standardized  Balance Assessment   Standardized Balance Assessment Berg Balance Test;Five Times Sit to Stand;Timed Up and Go Test   Five times sit to stand comments  25.16   10 Meter Walk 16.5 = 0.61 m/s    Berg Balance Test   Sit to Stand Able to stand without using hands and stabilize independently   Standing Unsupported Able to stand safely 2 minutes   Sitting with Back Unsupported but Feet Supported on Floor or Stool Able to sit safely and securely 2 minutes   Stand to Sit Sits safely with minimal use of hands   Transfers Able to transfer safely, minor use of hands   Standing Unsupported with Eyes Closed Able to stand 10 seconds safely   Standing Ubsupported with Feet Together Able to place  feet together independently and stand 1 minute safely   From Standing, Reach Forward with Outstretched Arm Can reach confidently >25 cm (10")   From Standing Position, Pick up Object from Floor Able to pick up shoe safely and easily   From Standing Position, Turn to Look Behind Over each Shoulder Looks behind from both sides and weight shifts well   Turn 360 Degrees Able to turn 360 degrees safely in 4 seconds or less   Standing Unsupported, Alternately Place Feet on Step/Stool Able to stand independently and safely and complete 8 steps in 20 seconds   Standing Unsupported, One Foot in Front Able to plae foot ahead of the other independently and hold 30 seconds   Standing on One Leg Tries to lift leg/unable to hold 3 seconds but remains standing independently   Total Score 52   Berg comment: Decreased single leg and tandem balance   Dynamic Gait Index   Level Surface Mild Impairment   Change in Gait Speed Mild Impairment   Gait with Horizontal Head Turns Moderate Impairment   Gait with Vertical Head Turns Moderate Impairment   Gait and Pivot Turn Mild Impairment   Step Over Obstacle Moderate Impairment   Step Around Obstacles Mild Impairment   Steps Mild Impairment   Total Score 13   High Level Balance   High Level Balance Comments Single leg balance approximately 3 seconds      Treatment  Physical Performance Pt completed ABC upon arrival (unbilled); Scored ABC and discussed results with patient; Completed 5TSTS, DGI, BERG and gait speed testing with patient; Goals updated with patient and discussion regarding plan of care; Outcome measures require extended time for patient to complete due to fatigue and seated rest breaks;  THER-EX Quantum leg press 105# 3 x 10;  NEUROMUSCULAR RE-EDUCATION  Airex Ball Pass Performed feet together balance with ball pass/lift horizontal x 30 seconds, and vertical x 30 seconds each;  Ambulation with Linton Flemings Ambulation in gym with  horizontal ball pass to self with head and eye follow x 45' x 2; Pt with intermittent LOB requiring therapist correction. Pt requires seated rest breaks due to fatigue. No dizziness, just instability, reported                                PT Education - 11/30/15 1413    Education provided Yes   Education Details HEP reinforced, goals updated, discussed plan of care   Person(s) Educated Patient   Methods Explanation   Comprehension Verbalized understanding             PT Long Term Goals - 11/30/15 1422  PT LONG TERM GOAL #1   Title Pt will be independent with HEP for self-management of symptoms and to maintain progress after therapy ends   Time 6   Period Weeks   Status On-going   PT LONG TERM GOAL #2   Title Pt will demonstrate improvement in gait speed to at least 0.7 m/s (0.3m/s MCID) for improved limited community mobility   Baseline 11/02/15: 0.57 m/s, 11/30/15: 0.61 m/s   Time 6   Period Weeks   Status New   PT LONG TERM GOAL #3   Title Pt will improve ABC to greater than 67% in order to demonstrates balance confidence above cut-off for increased fall risk    Baseline 11/02/15: 35.625%, 11/30/15: 46.25%   Time 6   Period Weeks   Status New   PT LONG TERM GOAL #4   Title Pt will improve DGI by at least 3 points in order to demonstrate improvement in balance and decrease risk for falls   Baseline 11/02/15: 12/24, 11/30/15: 13/24   Time 6   Period Weeks   Status New               Plan - 11/30/15 1414    Clinical Impression Statement Pt demonstrates no clinically significant improvement on DGI or 35m gait speed on this date. She demonstrates 10 point improvement in ABC but again this is not clinically significant however close. Pt is limited currently due to back pain and believes that this may be a significant contributing factor to her balance difficulty. Discussed plan of care with patient and she would like to finish her initial 6  week certification period to see if any improvements can be achieved. Also discussed the possible need for PT in the future to help manage back pain as this is a significant limtation to her mobility. Will continue to progress balance, vestibular and LE strengthening in hopes of making meaningful change over the next 2 weeks. Reinforced the importance of HEP.    Pt will benefit from skilled therapeutic intervention in order to improve on the following deficits Abnormal gait;Decreased balance;Decreased mobility;Dizziness;Difficulty walking   Rehab Potential Good   Clinical Impairments Affecting Rehab Potential Positive: motivation; Negative: chronicity, chronic CVA   PT Frequency 1x / week   PT Duration 6 weeks   PT Treatment/Interventions Canalith Repostioning;Aquatic Therapy;DME Instruction;Gait training;Stair training;Therapeutic activities;Therapeutic exercise;Balance training;Neuromuscular re-education;Patient/family education;Manual techniques;Vestibular   PT Next Visit Plan Outcome measures and update gcodes, progress VOR as able, continue ambulation with head turns and ball passes. Consider quick turns in hallway and/or retro ambulation. Continue LE strengthening   PT Home Exercise Plan Tandem progression with head turns and head/body turns, VOR x 1 horizontal feet together in standing with conflicting background, single leg balance   Consulted and Agree with Plan of Care Patient          G-Codes - 13-Dec-2015 1638    Functional Assessment Tool Used clinical judgement, 32m gait speed, DGI, ABC,   Functional Limitation Mobility: Walking and moving around   Mobility: Walking and Moving Around Current Status [phone]) At least 40 percent but less than 60 percent impaired, limited or restricted   Mobility: Walking and Moving Around Goal Status [phone]) At least 20 percent but less than 40 percent impaired, limited or restricted      Problem List Patient Active Problem List   Diagnosis Date  Noted  . Carotid artery narrowing 08/09/2015  . Syncope and collapse 07/30/2015  . Allergic rhinitis 01/05/2015  .  Cerebral infarct ([place]) 01/05/2015  . Accelerated essential hypertension 01/05/2015  . Benign gastric polyp 01/05/2015  . Hypercholesteremia 01/05/2015  . Neuropathy ([place]) 01/05/2015  . Adiposity 01/05/2015  . Arthritis of knee, degenerative 01/05/2015  . Borderline diabetes 01/05/2015  . Temporary cerebral vascular dysfunction 01/05/2015  . GERD (gastroesophageal reflux disease) 03/31/2013  . Gastric polyp    Phillips Grout PT, DPT   Eduard Penkala 12/01/2015, 4:45 PM  [place] MAIN [facility] SERVICES [address] [place], [place], [address] Phone: [phone]   Fax:  [phone]  Name: Dawn [name] MRN: [id number] Date of Birth: Jan 15, 1934

## 2015-12-07 ENCOUNTER — Ambulatory Visit: Payer: Medicare Other

## 2015-12-07 VITALS — BP 149/48 | HR 64

## 2015-12-07 DIAGNOSIS — R262 Difficulty in walking, not elsewhere classified: Secondary | ICD-10-CM

## 2015-12-07 DIAGNOSIS — R42 Dizziness and giddiness: Secondary | ICD-10-CM

## 2015-12-07 NOTE — Therapy (Signed)
[place] MAIN [facility] SERVICES [address] [place], [place], [address] Phone: [phone]   Fax:  [phone]  Physical Therapy Treatment  Patient Details  Name: Dawn [name] MRN: [id number] Date of Birth: 03/22/1934 Referring Provider: Jennings Books  Encounter Date: 12/07/2015      PT End of Session - 12/07/15 1502    Visit Number 6   Number of Visits 7   Date for PT Re-Evaluation 2015-12-19   Authorization Type g codes every 10th visit/30 days   PT Start Time 1415   PT Stop Time 1500   PT Time Calculation (min) 45 min   Equipment Utilized During Treatment Gait belt   Activity Tolerance Patient tolerated treatment well   Behavior During Therapy [facility] for tasks assessed/performed      Past Medical History  Diagnosis Date  . Arthritis   . Hypertension   . Stomach ulcer   . Acid reflux   . Gastric polyp 2014  . Stroke ([place])   . Back pain     Past Surgical History  Procedure Laterality Date  . Upper gi endoscopy  2011  . Vascular surgery Right 2010    right rental artery   . Cardiac catheterization  2007    normal, [employer] [facility] [place]   . Colonoscopy  2011, 2015    Dr. Nicolasa Ducking, Dr Melody Comas  . Cataract surgery   2013  . Laminectomy  2011    decompression   . Back surgery  2011    vertebroplasty   . Carpal tunnel release  2007  . Colon surgery  2015    lap sigmoid resection    Filed Vitals:   12/07/15 1417  BP: 149/48  Pulse: 64  SpO2: 99%    Visit Diagnosis:  Difficulty walking  Dizziness and giddiness      Subjective Assessment - 12/07/15 1417    Subjective Pt reports she was severe leg cramps yesterday but has improved today. She is still complaining of LLE cramps today (5/10). Pt reports no improvement in dizziness since starting therapy but states that the symptoms are very infrequent. She is performing HEP but reports limitations due to LE cramping. No specific questions or concerns at this time.     Pertinent History Pt arrives with complaints of bumping into walls, doors, and general unsteadiness. She describes the symptoms as feeling "lightheaded" however denies any true vertigo. Pt reports that the symptoms have existed for years and pt unable to determine the exact onset. Pt unclear what brings on her symptoms but states that they occur with position changes sometimes. She reports a syncopal episode on 07/30/15 while she was bringing her groceries into the house. Pt is unsure how long she lost consciousness but believes that it was less than 15 minutes. Pt was admitted to [facility] for observation and work-up. The brain MRI during admission showed: no acute intracranial process, specifically no acute ischemia. Moderate white matter changes/chronic small vessel ischemic disease, mildly progressed from prior examination. Old RIGHT thalamus, LEFT basal ganglia lacunar infarcts. Pt reports chronic L hand numbness from CVA 2 years ago. The discharge summary from [facility] is copied below for reference.    Currently in Pain? Yes   Pain Score 5    Pain Location Leg   Pain Orientation Left   Pain Descriptors / Indicators Cramping   Pain Onset More than a month ago   Pain Frequency Occasional      Treatment  NEUROMUSCULAR RE-EDUCATION  Airex [employer] Feet together eyes closed static balance x 30 seconds; Performed feet together balance with ball pass/lift horizontal x 30 seconds, and vertical x 30 seconds each;  Stair Taps 6" stair taps standing on Airex x 10 each;  VOR VOR x 1 horizontal in standing feet together on Airex 60 seconds x 3; (2/10 first 2 reps, 4/10 last rep), Pt still requires mod to max cues for speed adjustment. Pt reports no blurring, doubling or shifting of "X."   Airex Semi-tandem Balance Performed semi-tandem balance alternating forward foot x 30 seconds each, with horizontal head turns x 30 seconds, and horizontal head and body turns x 30 seconds each;  Foam Balance 1/2  foam roller balance with flat surface up while moving balls and down levels on SAEBO. Alternating UEs as well as fluctuating heights with head and eye follow;  Hallway Ambulation Ambulation in hallway with horizontal ball pass to self and then therapist with head and eye follow x 75', vertical x 75.' Pt with intermittent LOB requiring therapist correction. Pt requires seated rest break after each length of hallway due to fatigue and back pain. No dizziness reported                            PT Education - 12/07/15 1418    Education provided Yes   Education Details HEP reinforced (not progressed due to poor compliance), plan of care   Person(s) Educated Patient   Methods Explanation   Comprehension Verbalized understanding             PT Long Term Goals - 11/30/15 1422    PT LONG TERM GOAL #1   Title Pt will be independent with HEP for self-management of symptoms and to maintain progress after therapy ends   Time 6   Period Weeks   Status On-going   PT LONG TERM GOAL #2   Title Pt will demonstrate improvement in gait speed to at least 0.7 m/s (0.62m/s MCID) for improved limited community mobility   Baseline 11/02/15: 0.57 m/s, 11/30/15: 0.61 m/s   Time 6   Period Weeks   Status New   PT LONG TERM GOAL #3   Title Pt will improve ABC to greater than 67% in order to demonstrates balance confidence above cut-off for increased fall risk    Baseline 11/02/15: 35.625%, 11/30/15: 46.25%   Time 6   Period Weeks   Status New   PT LONG TERM GOAL #4   Title Pt will improve DGI by at least 3 points in order to demonstrate improvement in balance and decrease risk for falls   Baseline 11/02/15: 12/24, 11/30/15: 13/24   Time 6   Period Weeks   Status New               Plan - 12/07/15 1503    Clinical Impression Statement Avoided LE strengthening on this date due to increased LE cramping. Pt able to perform all balance and vestibular exercises with notable  imbalance. Pt continues to report dizziness with VOR exercise. She still requires considerable cues for proper technique and speed. Pt will be discharged after next visit. Will review HEP and update goals. Will not need to repeat outcome measures as it was just completed with patient at last session.    Pt will benefit from skilled therapeutic intervention in order to improve on the following deficits Abnormal gait;Decreased balance;Decreased mobility;Dizziness;Difficulty walking   Rehab Potential Good  Clinical Impairments Affecting Rehab Potential Positive: motivation; Negative: chronicity, chronic CVA   PT Frequency 1x / week   PT Duration 6 weeks   PT Treatment/Interventions Canalith Repostioning;Aquatic Therapy;DME Instruction;Gait training;Stair training;Therapeutic activities;Therapeutic exercise;Balance training;Neuromuscular re-education;Patient/family education;Manual techniques;Vestibular   PT Next Visit Plan Update g codes and discharge. Review and reinforce HEP. Continue to progress head turns with ambulation.    PT Home Exercise Plan Tandem progression with head turns and head/body turns, VOR x 1 horizontal feet together in standing with conflicting background, single leg balance   Consulted and Agree with Plan of Care Patient        Problem List Patient Active Problem List   Diagnosis Date Noted  . Carotid artery narrowing 08/09/2015  . Syncope and collapse 07/30/2015  . Allergic rhinitis 01/05/2015  . Cerebral infarct ([place]) 01/05/2015  . Accelerated essential hypertension 01/05/2015  . Benign gastric polyp 01/05/2015  . Hypercholesteremia 01/05/2015  . Neuropathy ([place]) 01/05/2015  . Adiposity 01/05/2015  . Arthritis of knee, degenerative 01/05/2015  . Borderline diabetes 01/05/2015  . Temporary cerebral vascular dysfunction 01/05/2015  . GERD (gastroesophageal reflux disease) 03/31/2013  . Gastric polyp    Phillips Grout PT, DPT   Huprich,Jason 12/07/2015, 3:08  PM  [place] MAIN [facility] SERVICES [address] [place], [place], [address] Phone: [phone]   Fax:  [phone]  Name: WILLONA DELK MRN: [id number] Date of Birth: Jan 06, 1934

## 2015-12-14 ENCOUNTER — Ambulatory Visit: Payer: Medicare Other | Attending: Neurology

## 2015-12-14 VITALS — BP 140/103 | HR 66

## 2015-12-14 DIAGNOSIS — M436 Torticollis: Secondary | ICD-10-CM | POA: Insufficient documentation

## 2015-12-14 DIAGNOSIS — M25612 Stiffness of left shoulder, not elsewhere classified: Secondary | ICD-10-CM | POA: Insufficient documentation

## 2015-12-14 DIAGNOSIS — M6281 Muscle weakness (generalized): Secondary | ICD-10-CM | POA: Insufficient documentation

## 2015-12-14 DIAGNOSIS — R42 Dizziness and giddiness: Secondary | ICD-10-CM | POA: Insufficient documentation

## 2015-12-14 DIAGNOSIS — R262 Difficulty in walking, not elsewhere classified: Secondary | ICD-10-CM

## 2015-12-14 DIAGNOSIS — M25512 Pain in left shoulder: Secondary | ICD-10-CM | POA: Diagnosis not present

## 2015-12-14 NOTE — Therapy (Signed)
[place] MAIN [facility] SERVICES [address] [place], [place], [address] Phone: [phone]   Fax:  [phone]  Physical Therapy Treatment/Discharge  Patient Details  Name: Dawn [name] MRN: [id number] Date of Birth: 1934/08/30 Referring Provider: Jennings Books  Encounter Date: 12/24/2015      PT End of Session - 2015-12-24 1401    Visit Number 7   Number of Visits 7   Date for PT Re-Evaluation December 24, 2015   Authorization Type g codes every 10th visit/30 days   PT Start Time 1350   PT Stop Time 1420   PT Time Calculation (min) 30 min   Equipment Utilized During Treatment Gait belt   Activity Tolerance Patient tolerated treatment well   Behavior During Therapy [facility] for tasks assessed/performed      Past Medical History  Diagnosis Date  . Arthritis   . Hypertension   . Stomach ulcer   . Acid reflux   . Gastric polyp 2014  . Stroke ([place])   . Back pain     Past Surgical History  Procedure Laterality Date  . Upper gi endoscopy  2011  . Vascular surgery Right 2010    right rental artery   . Cardiac catheterization  2007    normal, [employer] [facility] [place]   . Colonoscopy  2011, 2015    Dr. Nicolasa Ducking, Dr Melody Comas  . Cataract surgery   2013  . Laminectomy  2011    decompression   . Back surgery  2011    vertebroplasty   . Carpal tunnel release  2007  . Colon surgery  2015    lap sigmoid resection    Filed Vitals:   December 24, 2015 1355  BP: 140/103  Pulse: 66  SpO2: 100%    Visit Diagnosis:  Difficulty walking  Dizziness and giddiness      Subjective Assessment - 12/24/15 1353    Subjective Pt reports she is physically exhausted today. Her aunt and uncle have both passed over the last 3 weeks. She had a funeral yesterday in [place] for her uncle and states that she is very tired. In addition she had a sewage leak outside her house that she has been working with the plummers to fix since that time. Due to all of these  circumstances she has not been able to perform her home exercise program. Pt requests an abbreviated session today.    Pertinent History Pt arrives with complaints of bumping into walls, doors, and general unsteadiness. She describes the symptoms as feeling "lightheaded" however denies any true vertigo. Pt reports that the symptoms have existed for years and pt unable to determine the exact onset. Pt unclear what brings on her symptoms but states that they occur with position changes sometimes. She reports a syncopal episode on 07/30/15 while she was bringing her groceries into the house. Pt is unsure how long she lost consciousness but believes that it was less than 15 minutes. Pt was admitted to [facility] for observation and work-up. The brain MRI during admission showed: no acute intracranial process, specifically no acute ischemia. Moderate white matter changes/chronic small vessel ischemic disease, mildly progressed from prior examination. Old RIGHT thalamus, LEFT basal ganglia lacunar infarcts. Pt reports chronic L hand numbness from CVA 2 years ago. The discharge summary from [facility] is copied below for reference.    Currently in Pain? No/denies   Pain Onset More than a month ago       Treatment  NEUROMUSCULAR RE-EDUCATION  Tandem  progression  Tandem balance with horizontal head turns alternating LE forward and head/body turns x 30 seconds each;  Tandem gait Tandem in // bars 6' x 2;  VOR VOR x 1 horizontal in standing feet together x 60 seconds; Good technique demonstrated on this date without need for correction. Denies dizziness.   Single leg balance Single leg balance x 30 seconds/leg in bouts of 10-15 seconds each;  Updated goals and discussed plan of care with patient. Educated about importance of HEP. Discussed chronic back pain and need to follow-up with with spine surgeon. Educated about community-based walking/exercise programs.                            PT  Education - 12/14/15 1400    Education provided Yes   Education Details HEP reinforced, plan of care and discharge plans   Person(s) Educated Patient   Methods Explanation   Comprehension Verbalized understanding             PT Long Term Goals - 12/14/15 1414    PT LONG TERM GOAL #1   Title Pt will be independent with HEP for self-management of symptoms and to maintain progress after therapy ends   Time 6   Period Weeks   Status Achieved   PT LONG TERM GOAL #2   Title Pt will demonstrate improvement in gait speed to at least 0.7 m/s (0.54ms MCID) for improved limited community mobility   Baseline 11/02/15: 0.57 m/s, 11/30/15: 0.61 m/s   Time 6   Period Weeks   Status Not Met   PT LONG TERM GOAL #3   Title Pt will improve ABC to greater than 67% in order to demonstrates balance confidence above cut-off for increased fall risk    Baseline 11/02/15: 35.625%, 11/30/15: 46.25%   Time 6   Period Weeks   Status Not Met   PT LONG TERM GOAL #4   Title Pt will improve DGI by at least 3 points in order to demonstrate improvement in balance and decrease risk for falls   Baseline 11/02/15: 12/24, 11/30/15: 13/24   Time 6   Period Weeks   Status Not Met               Plan - 12/14/15 1401    Clinical Impression Statement Pt reports fatigue on this date and requests an abbreviated PT session. Pt denies any improvement in her symptoms with vestibular rehabilitation and reports significant limitations due to chronic low back pain. Encouraged pt to follow-up with spine surgeon to develop a plan of care which may include physical therapy. Pt did not meet her goals for therapy without change in walking speed, DGI, or balance confidence (ABC survey). She struggled to be consistent with her home exercise program and has been more limited recently due to deaths in the family and issues with her home. Pt is being discharged today with a home exercise program. Heavily encouraged pt to get involved  with community-based walking and exercise program.    Pt will benefit from skilled therapeutic intervention in order to improve on the following deficits Abnormal gait;Decreased balance;Decreased mobility;Dizziness;Difficulty walking   Rehab Potential Good   Clinical Impairments Affecting Rehab Potential Positive: motivation; Negative: chronicity, chronic CVA   PT Frequency 1x / week   PT Duration 6 weeks   PT Treatment/Interventions Canalith Repostioning;Aquatic Therapy;DME Instruction;Gait training;Stair training;Therapeutic activities;Therapeutic exercise;Balance training;Neuromuscular re-education;Patient/family education;Manual techniques;Vestibular   PT Next Visit Plan Update g  codes and discharge. Review and reinforce HEP. Continue to progress head turns with ambulation.    PT Home Exercise Plan Tandem progression with head turns and head/body turns, VOR x 1 horizontal feet together in standing with conflicting background, tandem gait, single leg balance   Consulted and Agree with Plan of Care Patient          G-Codes - Dec 29, 2015 1526    Functional Assessment Tool Used clinical judgement, 22mgait speed, DGI, ABC,   Functional Limitation Mobility: Walking and moving around   Mobility: Walking and Moving Around Goal Status [phone] At least 20 percent but less than 40 percent impaired, limited or restricted   Mobility: Walking and Moving Around Discharge Status [phone] At least 40 percent but less than 60 percent impaired, limited or restricted      Problem List Patient Active Problem List   Diagnosis Date Noted  . Carotid artery narrowing 08/09/2015  . Syncope and collapse 07/30/2015  . Allergic rhinitis 01/05/2015  . [place]bral [place] 01/05/2015  . Accelerated essential hypertension 01/05/2015  . Benign gastric polyp 01/05/2015  . Hypercholesteremia 01/05/2015  . Neuropathy ([place] 01/05/2015  . Adiposity 01/05/2015  . Arthritis of knee, degenerative 01/05/2015  .  Borderline diabetes 01/05/2015  . Temporary cerebral vascular dysfunction 01/05/2015  . GERD (gastroesophageal reflux disease) 03/31/2013  . Gastric polyp    JPhillips GroutPT, DPT   Huprich,Jason 4Apr 19, 2017 3:27 PM  [place] [facility] [address] [place] [address]Phone: [phone]  Fax:  [phone] Name: Dawn SPITZLEYMRN: [id number] of Birth: 6August 24, 1935

## 2015-12-16 ENCOUNTER — Ambulatory Visit (INDEPENDENT_AMBULATORY_CARE_PROVIDER_SITE_OTHER): Payer: Medicare Other | Admitting: Family Medicine

## 2015-12-16 VITALS — BP 138/50 | HR 60 | Temp 98.0°F | Resp 16 | Wt 201.0 lb

## 2015-12-16 DIAGNOSIS — E7800 Pure hypercholesterolemia, unspecified: Secondary | ICD-10-CM | POA: Diagnosis not present

## 2015-12-16 NOTE — Progress Notes (Signed)
Patient ID: Dawn [name], female   DOB: 05/07/1934, 82 y.o.   MRN: [id number]   Dawn [name]  MRN: [id number] DOB: [birth year]  Subjective:  HPI   1. Hypercholesteremia The patient is a 82 year old female who presents for follow up of her lipids.  Her last visit was on 10/19/15, at which time she was instructed to resume the use of Zocor 20 mg 1/2 daily along with Co Q 10.  She has been doing this and did try to go to a whole pill but was unable.  So she is currenlty getting a total of 10 mg Zocor with the Co A 10.    Patient Active Problem List   Diagnosis Date Noted  . Carotid artery narrowing 08/09/2015  . Syncope and collapse 07/30/2015  . Allergic rhinitis 01/05/2015  . Cerebral infarct ([place]) 01/05/2015  . Accelerated essential hypertension 01/05/2015  . Benign gastric polyp 01/05/2015  . Hypercholesteremia 01/05/2015  . Neuropathy ([place]) 01/05/2015  . Adiposity 01/05/2015  . Arthritis of knee, degenerative 01/05/2015  . Borderline diabetes 01/05/2015  . Temporary cerebral vascular dysfunction 01/05/2015  . GERD (gastroesophageal reflux disease) 03/31/2013  . Gastric polyp     Past Medical History  Diagnosis Date  . Arthritis   . Hypertension   . Stomach ulcer   . Acid reflux   . Gastric polyp 2014  . Stroke ([place])   . Back pain     Social History   Social History  . Marital Status: Divorced    Spouse Name: none  . Number of Children: 1  . Years of Education: 13   Occupational History  . retired    Social History Main Topics  . Smoking status: Never Smoker   . Smokeless tobacco: Never Used  . Alcohol Use: No  . Drug Use: No  . Sexual Activity: No   Other Topics Concern  . Not on file   Social History Narrative    Outpatient Prescriptions Prior to Visit  Medication Sig Dispense Refill  . acetaminophen (TYLENOL) 325 MG tablet Take 650 mg by mouth every 6 (six) hours as needed for mild pain.    [name] aspirin EC 81 MG tablet Take 81 mg by mouth  daily.    . Biotin 5000 MCG CAPS Take 5,000 mcg by mouth daily.    . Calcium Carbonate-Vitamin D (CALCIUM 600+D) 600-200 MG-UNIT TABS Take 1 tablet by mouth daily.    . fexofenadine (ALLEGRA) 180 MG tablet Take 180 mg by mouth daily.    [name] labetalol (NORMODYNE) 200 MG tablet Take 1 tablet (200 mg total) by mouth 3 (three) times daily. 270 tablet 3  . Magnesium 250 MG TABS Take by mouth 1 day or 1 dose.    . Olmesartan-Amlodipine-HCTZ (TRIBENZOR) 40-10-25 MG TABS Take 1 tablet by mouth daily.    [name] omeprazole (PRILOSEC) 20 MG capsule TAKE ONE CAPSULE BY MOUTH ONCE A DAY 30 capsule 9  . ranitidine (ZANTAC) 150 MG tablet Take 150 mg by mouth at bedtime as needed for heartburn.    . simvastatin (ZOCOR) 20 MG tablet Take 20 mg by mouth daily. Reported on 11/02/2015    . traMADol (ULTRAM) 50 MG tablet Take 1 tablet (50 mg total) by mouth every 4 (four) hours as needed. 100 tablet 5   No facility-administered medications prior to visit.    Allergies  Allergen Reactions  . Quinine Anaphylaxis and Swelling  . Statins     She has tried several and  has had issues with them like joint pain  . Celecoxib Rash  . Codeine Rash    Review of Systems  Constitutional: Negative for fever and malaise/fatigue.  Respiratory: Negative for cough, shortness of breath and wheezing.   Cardiovascular: Negative for chest pain, palpitations, orthopnea and leg swelling.  Neurological: Negative for weakness and headaches.   Objective:  BP 138/50 mmHg  Pulse 60  Temp(Src) 98 F (36.7 C) (Oral)  Resp 16  Wt 201 lb (91.173 kg)  Physical Exam  Constitutional: She is oriented to person, place, and time and well-developed, well-nourished, and in no distress.  HENT:  Head: Normocephalic and atraumatic.  Right Ear: External ear normal.  Left Ear: External ear normal.  Nose: Nose normal.  Eyes: Conjunctivae are normal.  Neck: Thyromegaly present.  Cardiovascular: Normal rate, regular rhythm and normal heart sounds.    Pulmonary/Chest: Effort normal and breath sounds normal.  Abdominal: Soft.  Neurological: She is alert and oriented to person, place, and time. Gait normal.  Skin: Skin is warm and dry.  Psychiatric: Mood, memory, affect and judgment normal.    Assessment and Plan :  Hypercholesteremia Hypertension If labs are good return to clinic 6 months [facility] 12/16/2015 3:57 PM

## 2015-12-20 DIAGNOSIS — E7800 Pure hypercholesterolemia, unspecified: Secondary | ICD-10-CM | POA: Diagnosis not present

## 2015-12-20 DIAGNOSIS — I10 Essential (primary) hypertension: Secondary | ICD-10-CM | POA: Diagnosis not present

## 2015-12-20 DIAGNOSIS — I6523 Occlusion and stenosis of bilateral carotid arteries: Secondary | ICD-10-CM | POA: Diagnosis not present

## 2015-12-21 LAB — LIPID PANEL WITH LDL/HDL RATIO
Cholesterol, Total: 158 mg/dL (ref 100–199)
HDL: 57 mg/dL (ref >39)
LDL Calculated: 77 mg/dL (ref 0–99)
LDl/HDL Ratio: 1.4 ratio units (ref 0.0–3.2)
TRIGLYCERIDES: 118 mg/dL (ref 0–149)
VLDL CHOLESTEROL CAL: 24 mg/dL (ref 5–40)

## 2015-12-21 LAB — HEPATIC FUNCTION PANEL
ALK PHOS: 91 IU/L (ref 39–117)
ALT: 19 IU/L (ref 0–32)
AST: 18 IU/L (ref 0–40)
Albumin: 4.1 g/dL (ref 3.5–4.8)
BILIRUBIN, DIRECT: 0.12 mg/dL (ref 0.00–0.40)
Bilirubin Total: 0.4 mg/dL (ref 0.0–1.2)
Total Protein: 6.6 g/dL (ref 6.0–8.5)

## 2015-12-22 IMAGING — CT CT ABD-PELV W/ CM
2 of 5 series · 16 of 46 positions shown, 18 images · IV contrast (isovue)
Comparison: 10/13/2013

CLINICAL DATA: Followup diverticulitis.

EXAM:
CT ABDOMEN AND PELVIS WITH CONTRAST
TECHNIQUE: Multidetector CT imaging of the abdomen and pelvis was performed
using the standard protocol following bolus administration of
intravenous contrast.
CONTRAST:  100 mL Isovue 370 IV

[Series 2: routine with · axial · 0.98mm/px · z∈[-606,-236]mm · 13 of 82 slices shown, 15 images]
[im 4/82  soft-tissue]
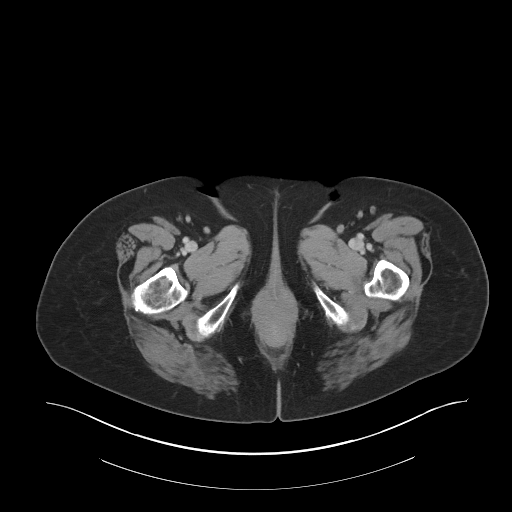
[im 4/82  bone]
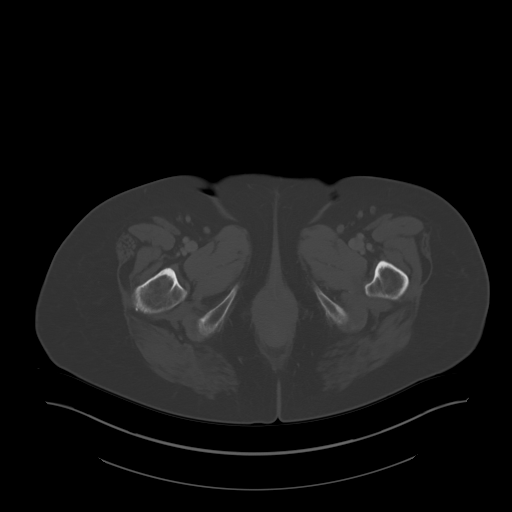
[im 12/82  soft-tissue]
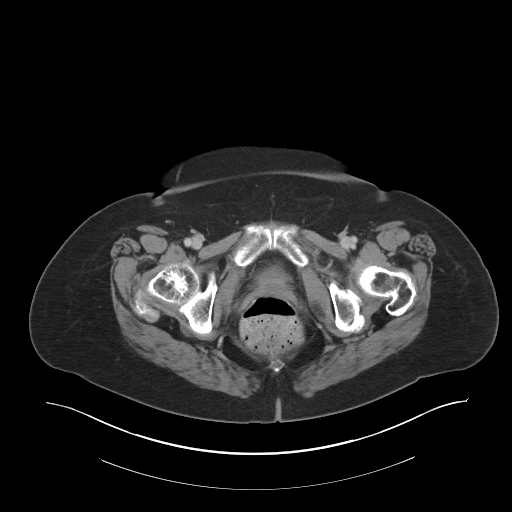
[im 16/82  soft-tissue]
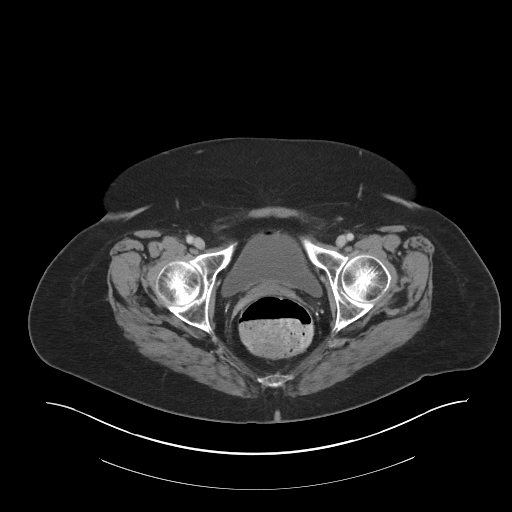
[im 24/82  soft-tissue]
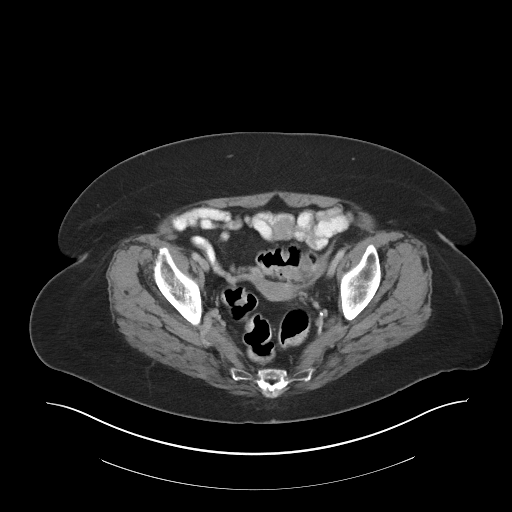
[im 28/82  soft-tissue]
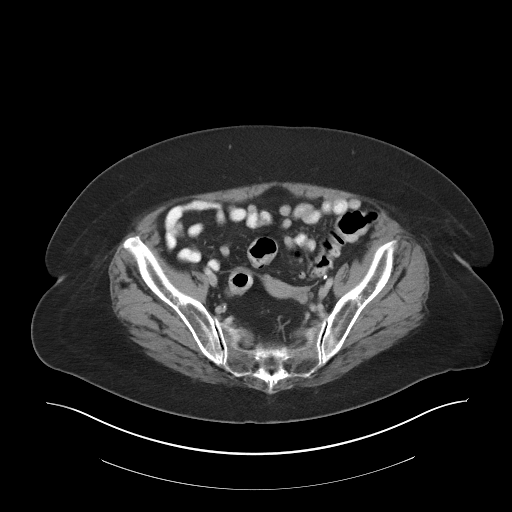
[im 35/82  soft-tissue]
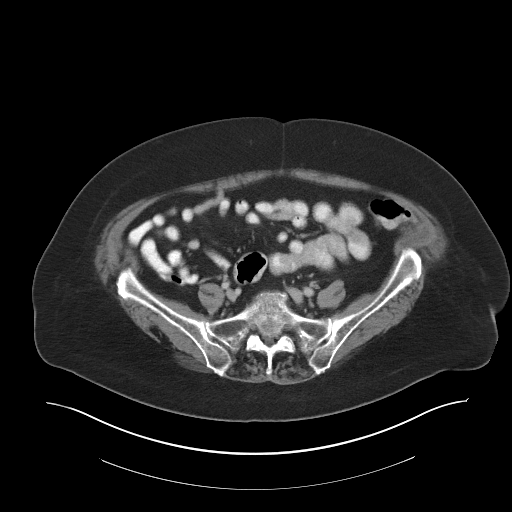
[im 43/82  soft-tissue]
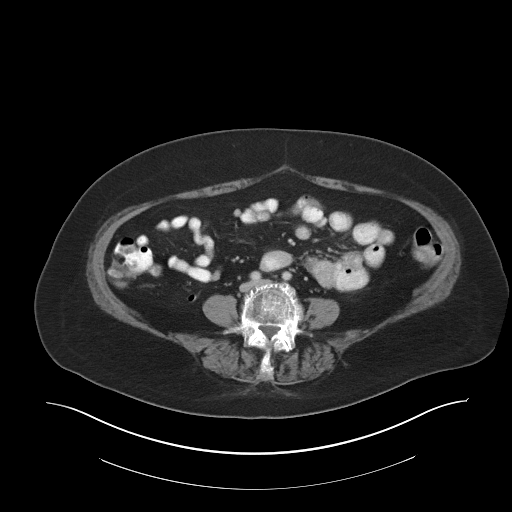
[im 47/82  soft-tissue]
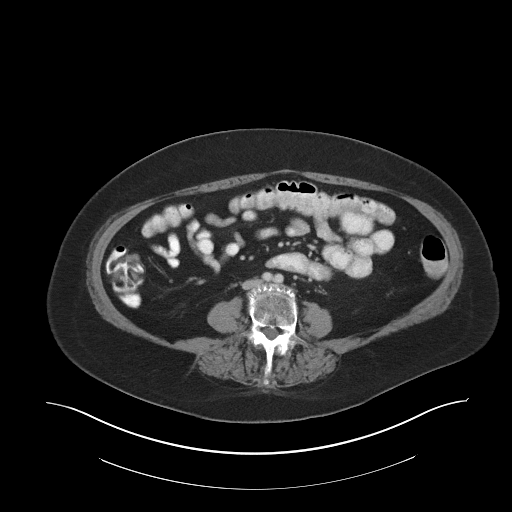
[im 55/82  soft-tissue]
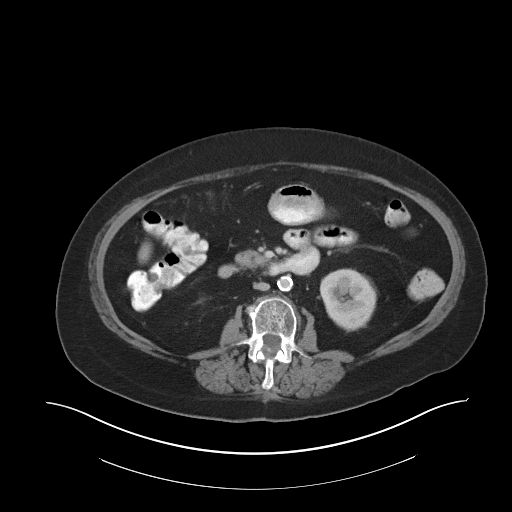
[im 55/82  bone]
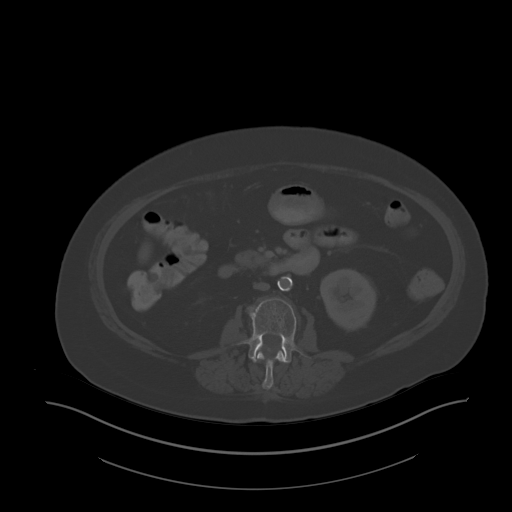
[im 58/82  soft-tissue]
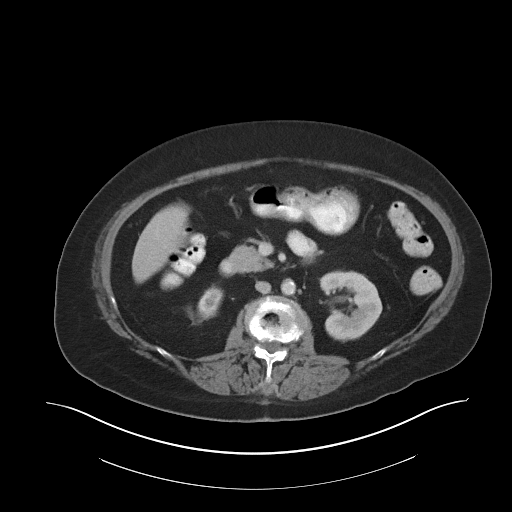
[im 66/82  soft-tissue]
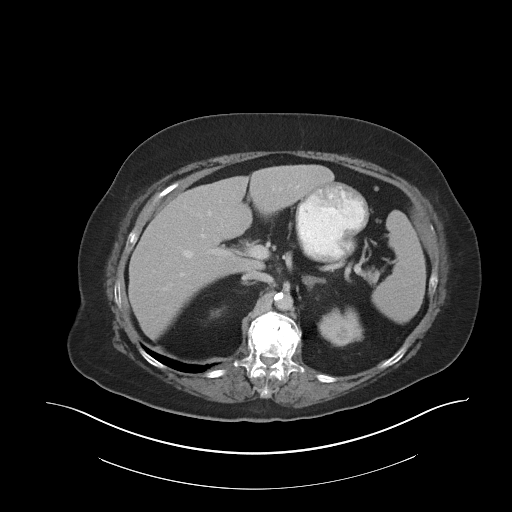
[im 70/82  soft-tissue]
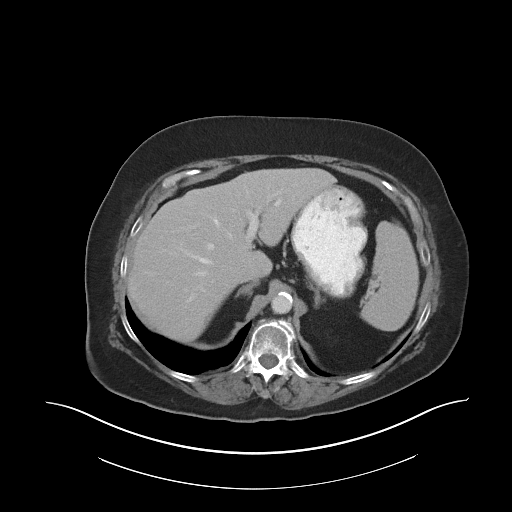
[im 78/82  soft-tissue]
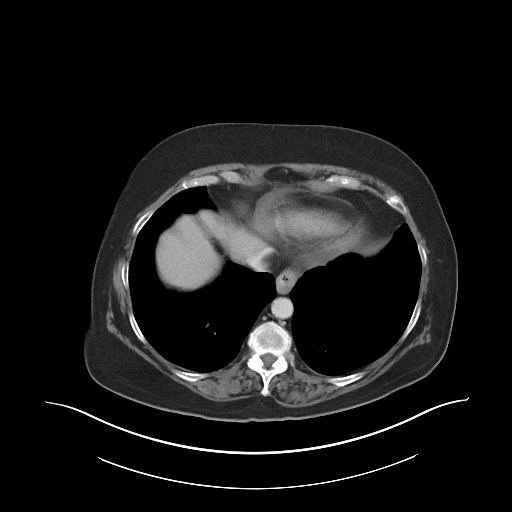

[Series 6: cor routine with · coronal · 0.88mm/px · 3 of 129 slices shown]
[im 43/129  soft-tissue]
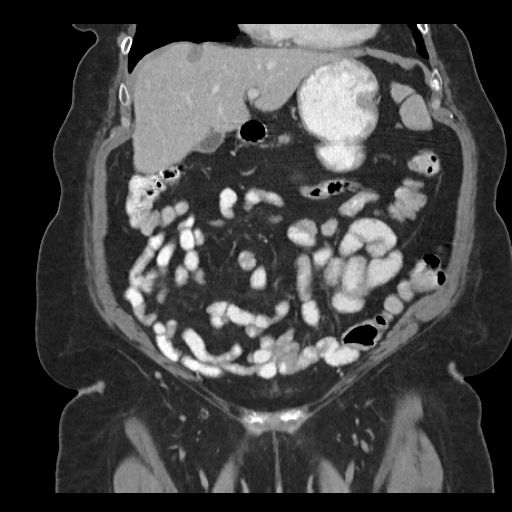
[im 57/129  soft-tissue]
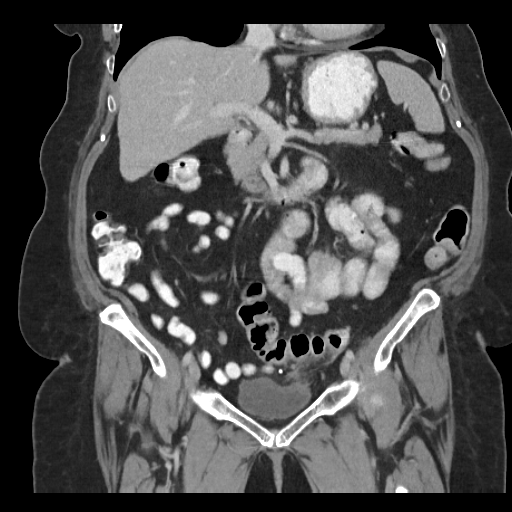
[im 72/129  soft-tissue]
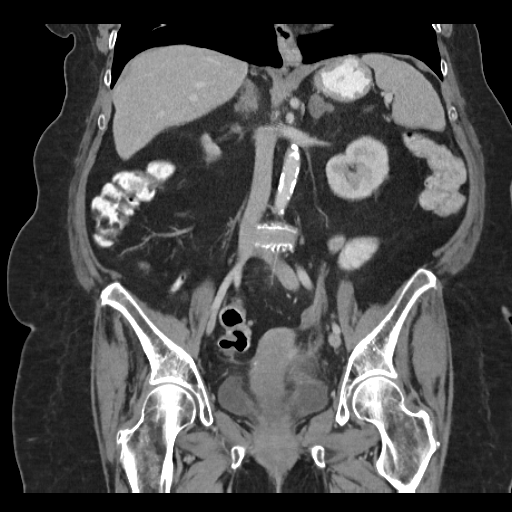

[16 of 46 positions shown; findings below may reference images not displayed]

FINDINGS: Lung bases are within normal.

Abdominal images demonstrate a normal liver, spleen, pancreas,
gallbladder and right adrenal gland. There is minimal prominence of
the left adrenal gland which is stable there is a small/ atrophic
right kidney with some cm hypodensity over the midpole too small to
characterize but likely a cyst. There is minimal stable right
perinephric fluid. Left kidney is within normal. Possible small
duodenal diverticulum. Appendix is normal. Ureters are normal.
Suggestion bilateral proximal renal artery stents. Calcified plaque
over the abdominal aorta.

Pelvic images demonstrate evidence of patient's acute diverticulitis
involving a short segment of sigmoid colon with moderate interval
improvement in the inflammatory response in the mesenteric fat.
There is continued evidence of patient's diverticular abscess along
the inferior border of the sigmoid colon with less low density
material and slightly more air present. This has decreased in size
and now measures approximately 2.9 x 3 cm in its AP and transverse
dimensions (previously 4.1 x 4.3 cm). There is a small collection of
air over the nondependent portion of the bladder. Remaining pelvic
structures are unremarkable. No change in patient's known spinal
compression fractures.
IMPRESSION: Evidence of patient's known acute diverticulitis of the sigmoid
colon with moderate interval improvement in the adjacent
inflammatory response in the mesenteric fat. Interval decrease in
size of patient's diverticular abscess along the inferior border of
the sigmoid now measuring 2.9 x 3 cm (previously 4.1 x 4.3 cm).

Stable left adrenal prominence. Recommend followup CT 1 year. This
recommendation follows ACR consensus guidelines: Managing Incidental
Findings on Abdominal CT: White Paper of the ACR Incidental Findings
Committee. [HOSPITAL] [id number];[DATE]

Small/atrophic right kidney with single subcentimeter hypodensity
over the mid pole too small to characterize but likely a cyst.

Small focus of air within the nondependent portable bladder which
may be from recent instrumentation, although this finding could also
be seen due to infection or enterovesical fistula.

Known compression stable spinal fractures.

## 2015-12-28 ENCOUNTER — Other Ambulatory Visit: Payer: Self-pay | Admitting: Family Medicine

## 2016-01-04 ENCOUNTER — Encounter: Payer: Self-pay | Admitting: Physical Therapy

## 2016-01-04 ENCOUNTER — Ambulatory Visit: Payer: Medicare Other | Admitting: Physical Therapy

## 2016-01-04 DIAGNOSIS — E669 Obesity, unspecified: Secondary | ICD-10-CM | POA: Diagnosis not present

## 2016-01-04 DIAGNOSIS — M25612 Stiffness of left shoulder, not elsewhere classified: Secondary | ICD-10-CM

## 2016-01-04 DIAGNOSIS — R42 Dizziness and giddiness: Secondary | ICD-10-CM | POA: Diagnosis not present

## 2016-01-04 DIAGNOSIS — M436 Torticollis: Secondary | ICD-10-CM

## 2016-01-04 DIAGNOSIS — M6281 Muscle weakness (generalized): Secondary | ICD-10-CM | POA: Diagnosis not present

## 2016-01-04 DIAGNOSIS — R262 Difficulty in walking, not elsewhere classified: Secondary | ICD-10-CM | POA: Diagnosis not present

## 2016-01-04 DIAGNOSIS — M25512 Pain in left shoulder: Secondary | ICD-10-CM | POA: Diagnosis not present

## 2016-01-04 DIAGNOSIS — R251 Tremor, unspecified: Secondary | ICD-10-CM | POA: Diagnosis not present

## 2016-01-04 NOTE — Therapy (Signed)
[place] [facility] [address] [place], [address] Phone: [phone]   Fax:  [phone]  Physical Therapy Evaluation  Patient Details  Name: Dawn [name] MRN: [id number] Date of Birth: [birth year] Referring Provider: Mack Guise  Encounter Date: 01/04/2016      PT End of Session - 01/04/16 1145    Visit Number 1   Number of Visits 9   Date for PT Re-Evaluation 02/01/16   Authorization Type g code   PT Start Time 1100   PT Stop Time 1200   PT Time Calculation (min) 60 min   Activity Tolerance Patient tolerated treatment well   Behavior During Therapy [facility] for tasks assessed/performed      Past Medical History  Diagnosis Date  . Arthritis   . Hypertension   . Stomach ulcer   . Acid reflux   . Gastric polyp 2014  . Stroke ([place])   . Back pain     Past Surgical History  Procedure Laterality Date  . Upper gi endoscopy  2011  . Vascular surgery Right 2010    right rental artery   . Cardiac catheterization  2007    normal[facility] [employer] [facility] [place]   . Colonoscopy  2011, 2015    Dr. Nicolasa Ducking, Dr Melody Comas  . Cataract surgery   2013  . Laminectomy  2011    decompression   . Back surgery  2011    vertebroplasty   . Carpal tunnel release  2007  . Colon surgery  2015    lap sigmoid resection    There were no vitals filed for this visit.       Subjective Assessment - 01/04/16 1113    Subjective Pt reports 3 months of idiopathic L shoulder pain. Pt did have CVA which affected her L side 2 yrs ago. Denies cervical spine pain. N/T since stroke but no change with pain. Pt is currently limited in dressing, home activities. Pain is gradually improving with no tx.   Pertinent History L shoulder pain.   Diagnostic tests radiography - negative.   Patient Stated Goals to have decr. pain, improved ability to do hair and dress.   Currently in Pain? No/denies   Pain Score 0-No pain            [facility]  PT Assessment - 01/04/16 0001    Assessment   Medical Diagnosis Impingement syndrome left shoulder   Referring Provider Mack Guise   Onset Date/Surgical Date 10/06/15   Hand Dominance Right   Prior Therapy none for this   Precautions   Precautions Fall   Restrictions   Weight Bearing Restrictions No   Balance Screen   Has the patient fallen in the past 6 months Yes   How many times? Once   Has the patient had a decrease in activity level because of a fear of falling?  No   Is the patient reluctant to leave their home because of a fear of falling?  Yes   [place] residence   Living Arrangements Alone   Available Help at Discharge Family   Type of [place] to enter   Entrance Stairs-Number of Steps 6   Entrance Stairs-Rails Right   [place] One level   [place] - single point   Prior Function   Level of [place] Retired   Associate Professor   Overall Cognitive Status Within  Functional Limits for tasks assessed   ROM / Strength   AROM / PROM / Strength AROM;PROM;Strength   AROM   Overall AROM Comments cervical rotation R 35 deg., L 45 deg with incr. pain. lateral flex R 35, L 45 deg with incr. pull in L side. R UE WNL. L flex 100 deg. abd to 85 deg. pain with IR.   PROM   Overall PROM Comments attempted PROM for shoulder but due to complete inability to relax this was not an accurate   Strength   Overall Strength Comments L UE 3/5 for deltoid, periscapular musculature. 4/5 on R.    Palpation   Palpation comment Pain with palpation of UT, LS, pain with very gentle CPA at T3-4. all vertebra hypomobile.              Objective: Due to high degree of tone in periscapular musculature and pain in this region, performed in prone with petrisage, compression, and cross friction massage.   Upon sitting up following this cervical AROM improved by15 deg. B. Pt reported incr. Soreness but  decr. Pain.  Educated pt on posture following this to decr. FHP and shoulder slump              PT Long Term Goals - 01/04/16 1632    PT LONG TERM GOAL #1   Title Pt will report decr. pain at rest to les than 2/10 .   Baseline 7/10   Time 4   PT LONG TERM GOAL #2   Title Pt will improve shoulder AROM to 150 deg. abduction in order to be able to do hair.   Baseline 90-100 deg.   Time 4   PT LONG TERM GOAL #3   Title Pt will improve cervical ROM to participate in driving activity.               Plan - 01/04/16 1200    Clinical Impression Statement Dawn [name] is a 82 y/o female who is pleasant and cooperative during PT session. She presents with a 3 month history of L shoulder pain. During today's physical therapy evaluation she is functionally limited by increased pain in L shoulder and decreased range. She is limited by pain with L shoulder abduction (85), flexion (100), IR and extension by pain. She also presents with limited cervical ROM LF R/L: 35/45, Rot R/L: 35/45 and states and increased pulling and pain towards the left. Pt is sensitive to palpation of supraspinatus, pec minor, anterior and middle deltoid as well as upper trapezius on the left side. Pt presents with increased thoracic kyphosis but is responsive to verbal cueing to correct posture. Based on her ROM limitations, pain limitations and postural deficits, physical therapy will focus on strengthening of scapular stabllizers and decreasing muscle hypertonicity.    Rehab Potential Good   Clinical Impairments Affecting Rehab Potential Positive: receptive to therapy. Negative: CVA, obesity    PT Frequency 2x / week   PT Duration 4 weeks   PT Treatment/Interventions Therapeutic exercise;Manual techniques;Aquatic Therapy;Neuromuscular re-education;ADLs/Self Care Home Management;Patient/family education      Patient will benefit from skilled therapeutic intervention in order to improve the following deficits  and impairments:  Decreased range of motion, Decreased mobility, Improper body mechanics, Pain, Hypomobility, Decreased strength  Visit Diagnosis: Pain in left shoulder - Plan: PT plan of care cert/re-cert  Stiffness of cervical spine - Plan: PT plan of care cert/re-cert  Muscle weakness (generalized) - Plan: PT plan of care cert/re-cert  Stiffness of  left shoulder, not elsewhere classified - Plan: PT plan of care cert/re-cert      G-Codes - 0000000 1147    Functional Assessment Tool Used ROM, NPRS   Functional Limitation Carrying, moving and handling objects   Mobility: Walking and Moving Around Current Status [id number]) At least 40 percent but less than 60 percent impaired, limited or restricted   Mobility: Walking and Moving Around Goal Status [phone]) At least 1 percent but less than 20 percent impaired, limited or restricted       Problem List Patient Active Problem List   Diagnosis Date Noted  . Carotid artery narrowing 08/09/2015  . Syncope and collapse 07/30/2015  . Allergic rhinitis 01/05/2015  . Cerebral infarct ([place]) 01/05/2015  . Accelerated essential hypertension 01/05/2015  . Benign gastric polyp 01/05/2015  . Hypercholesteremia 01/05/2015  . Neuropathy ([place]) 01/05/2015  . Adiposity 01/05/2015  . Arthritis of knee, degenerative 01/05/2015  . Borderline diabetes 01/05/2015  . Temporary cerebral vascular dysfunction 01/05/2015  . GERD (gastroesophageal reflux disease) 03/31/2013  . Gastric polyp     Adonnis Salceda PT DPT 01/04/2016, 5:03 PM  [place] [facility] [address], [place], [address] Phone: [phone]   Fax:  [phone]  Name: PARLIE OMANS MRN: [id number] Date of Birth: Sep 14, 1933

## 2016-01-07 ENCOUNTER — Encounter: Payer: Self-pay | Admitting: Family Medicine

## 2016-01-07 ENCOUNTER — Ambulatory Visit (INDEPENDENT_AMBULATORY_CARE_PROVIDER_SITE_OTHER): Payer: Medicare Other | Admitting: Family Medicine

## 2016-01-07 ENCOUNTER — Telehealth: Payer: Self-pay

## 2016-01-07 VITALS — BP 142/62 | HR 68 | Temp 98.5°F | Resp 16 | Wt 202.0 lb

## 2016-01-07 DIAGNOSIS — N390 Urinary tract infection, site not specified: Secondary | ICD-10-CM | POA: Diagnosis not present

## 2016-01-07 LAB — POCT URINALYSIS DIPSTICK
Bilirubin, UA: NEGATIVE
GLUCOSE UA: NEGATIVE
Ketones, UA: NEGATIVE
NITRITE UA: POSITIVE
PH UA: 6.5
PROTEIN UA: NEGATIVE
SPEC GRAV UA: 1.01
UROBILINOGEN UA: 0.2

## 2016-01-07 MED ORDER — CIPROFLOXACIN HCL 500 MG PO TABS: 500.0000 mg | ORAL_TABLET | Freq: Two times a day (BID) | ORAL | Status: AC

## 2016-01-07 NOTE — Progress Notes (Signed)
Patient: Dawn [name] Female    DOB: 25-Aug-1934   82 y.o.   MRN: [id number] Visit Date: 01/07/2016  Today's Provider: Lelon Huh, MD   Chief Complaint  Patient presents with  . Abdominal Pain   Subjective:    Abdominal Pain This is a new problem. The current episode started today. The problem has been gradually improving. The pain is located in the LLQ. The pain is moderate. The quality of the pain is aching. The abdominal pain does not radiate. Associated symptoms include arthralgias and headaches. Pertinent negatives include no anorexia, belching, constipation, diarrhea, dysuria, fever, flatus, frequency, hematuria, melena, myalgias, nausea, vomiting or weight loss. Nothing aggravates the pain. She has tried acetaminophen for the symptoms. The treatment provided mild relief.  Patient states earlier this week she was dizzy, her face was flushed and she was shaky, but has resolved. Patient reports she has had the urge to urinate, but is having trouble empty her bladder.      Allergies  Allergen Reactions  . Quinine Anaphylaxis and Swelling  . Statins     She has tried several and has had issues with them like joint pain  . Celecoxib Rash  . Codeine Rash   Previous Medications   ACETAMINOPHEN (TYLENOL) 325 MG TABLET    Take 650 mg by mouth every 6 (six) hours as needed for mild pain.   ASPIRIN EC 81 MG TABLET    Take 81 mg by mouth daily.   BIOTIN 5000 MCG CAPS    Take 5,000 mcg by mouth daily.   CALCIUM CARBONATE-VITAMIN D (CALCIUM 600+D) 600-200 MG-UNIT TABS    Take 1 tablet by mouth daily.   CO-ENZYME Q-10 30 MG CAPSULE    Take 30 mg by mouth 3 (three) times daily.   FEXOFENADINE (ALLEGRA) 180 MG TABLET    Take 180 mg by mouth daily.   LABETALOL (NORMODYNE) 200 MG TABLET    Take 1 tablet (200 mg total) by mouth 3 (three) times daily.   MAGNESIUM 250 MG TABS    Take by mouth 1 day or 1 dose.   OLMESARTAN-AMLODIPINE-HCTZ 40-10-25 MG TABS    TAKE 1 TABLET BY MOUTH  EVERY DAY   OMEPRAZOLE (PRILOSEC) 20 MG CAPSULE    TAKE ONE CAPSULE BY MOUTH ONCE A DAY   RANITIDINE (ZANTAC) 150 MG TABLET    Take 150 mg by mouth at bedtime as needed for heartburn.   SIMVASTATIN (ZOCOR) 20 MG TABLET    Take 20 mg by mouth daily. Reported on 11/02/2015   TRAMADOL (ULTRAM) 50 MG TABLET    Take 1 tablet (50 mg total) by mouth every 4 (four) hours as needed.    Review of Systems  Constitutional: Positive for diaphoresis. Negative for fever, chills, weight loss, appetite change and fatigue.  Respiratory: Negative for chest tightness and shortness of breath.   Cardiovascular: Negative for chest pain and palpitations.  Gastrointestinal: Positive for abdominal pain. Negative for nausea, vomiting, diarrhea, constipation, melena, anorexia and flatus.  Genitourinary: Positive for urgency and difficulty urinating. Negative for dysuria, frequency and hematuria.  Musculoskeletal: Positive for arthralgias. Negative for myalgias.  Neurological: Positive for dizziness, light-headedness and headaches. Negative for weakness.    Social History  Substance Use Topics  . Smoking status: Never Smoker   . Smokeless tobacco: Never Used  . Alcohol Use: No   Objective:   BP 142/62 mmHg  Pulse 68  Temp(Src) 98.5 F (36.9 C) (Oral)  Resp  16  Wt 202 lb (91.627 kg)  SpO2 98%  Physical Exam  General Appearance:    Alert, cooperative, no distress  Eyes:    PERRL, conjunctiva/corneas clear, EOM's intact       Lungs:     Clear to auscultation bilaterally, respirations unlabored  Heart:    Regular rate and rhythm  Abdomen:   Mild suprapubic tenderness, more so on left. No masses, no rebound, no guarding. No CVA tenderness        Assessment & Plan:     .1. Urinary tract infection without hematuria, site unspecified  - POCT urinalysis dipstick - ciprofloxacin (CIPRO) 500 MG tablet; Take 1 tablet (500 mg total) by mouth 2 (two) times daily.  Dispense: 14 tablet; Refill: 0 - Urine  Culture  Call if symptoms change or if not rapidly improving.          Lelon Huh, MD  [facility]

## 2016-01-07 NOTE — Telephone Encounter (Signed)
Patient called saying that she has had pain in her left side since last night. She reports that since lunch, she has weakness, sweats, and urinary frequency. Patient reports that she had water and coffee this morning, but still cant seem to urinate. Patient thinks that she may have a UTI. Scheduled patient to come in and be seen this afternoon.

## 2016-01-09 LAB — URINE CULTURE

## 2016-01-10 ENCOUNTER — Ambulatory Visit: Payer: Medicare Other | Attending: Orthopedic Surgery | Admitting: Physical Therapy

## 2016-01-10 DIAGNOSIS — M25512 Pain in left shoulder: Secondary | ICD-10-CM | POA: Diagnosis not present

## 2016-01-10 DIAGNOSIS — M6281 Muscle weakness (generalized): Secondary | ICD-10-CM | POA: Diagnosis not present

## 2016-01-10 DIAGNOSIS — M25612 Stiffness of left shoulder, not elsewhere classified: Secondary | ICD-10-CM | POA: Insufficient documentation

## 2016-01-10 DIAGNOSIS — M436 Torticollis: Secondary | ICD-10-CM | POA: Insufficient documentation

## 2016-01-10 NOTE — Therapy (Signed)
[place] [facility] [address], [place], [address] Phone: [phone]   Fax:  [phone]  Physical Therapy Treatment  Patient Details  Name: Dawn [name] MRN: [id number] Date of Birth: Oct 05, 1933 Referring Provider: Mack Guise  Encounter Date: 01/10/2016      PT End of Session - 01/10/16 1534    Visit Number 2   Number of Visits 9   Date for PT Re-Evaluation 02/01/16   Authorization Type g code   PT Start Time [id number]   PT Stop Time 1610   PT Time Calculation (min) 40 min   Activity Tolerance Patient tolerated treatment well   Behavior During Therapy [facility] for tasks assessed/performed      Past Medical History  Diagnosis Date  . Arthritis   . Hypertension   . Stomach ulcer   . Acid reflux   . Gastric polyp 2014  . Stroke ([place])   . Back pain     Past Surgical History  Procedure Laterality Date  . Upper gi endoscopy  2011  . Vascular surgery Right 2010    right rental artery   . Cardiac catheterization  2007    normal, [employer] [facility] [place]   . Colonoscopy  2011, 2015    Dr. Nicolasa Ducking, Dr Melody Comas  . Cataract surgery   2013  . Laminectomy  2011    decompression   . Back surgery  2011    vertebroplasty   . Carpal tunnel release  2007  . Colon surgery  2015    lap sigmoid resection    There were no vitals filed for this visit.      Subjective Assessment - 01/10/16 1533    Subjective Pt reports no change in symptoms since previous session.   Pertinent History L shoulder pain.   Diagnostic tests radiography - negative.   Patient Stated Goals to have decr. pain, improved ability to do hair and dress.   Currently in Pain? No/denies   Pain Score 0-No pain              Objective: 5 min practice standing without use of UE. Lumbar flexion, cuing for "nose over toes". Same with light shoulder extension. No change in pain. Following this performed only pushing up on R which resolved  pain.  Pulleys - required extensive cuing for performance to sit up, to understand how to relax LUE with performance. Performed bent arm flexion, straight arm flexion, bent arm abduction.  Doorknob stretch, initially painful, improved with finding lower doorknob to use for extension.  STM performed on deltoid, bicep, supraspinatus, UT, 10 min total.  Following this reassessed shoulder flexion, improved from 80 deg. To 140 deg.                     PT Education - 01/10/16 1534    Education provided Yes   Education Details how to get up pain free   Person(s) Educated Patient   Methods Explanation   Comprehension Verbalized understanding             PT Long Term Goals - 01/04/16 1632    PT LONG TERM GOAL #1   Title Pt will report decr. pain at rest to les than 2/10 .   Baseline 7/10   Time 4   PT LONG TERM GOAL #2   Title Pt will improve shoulder AROM to 150 deg. abduction in order to be able to do hair.   Baseline  90-100 deg.   Time 4   PT LONG TERM GOAL #3   Title Pt will improve cervical ROM to participate in driving activity.               Plan - 01/10/16 1547    Clinical Impression Statement Pt has made minimal progress so far with therapy. appears to improve with AAROM exercises including doorknob stretch and pendulum stretch. Able to modify sit<>stand to minimize UE pain. Will continue to address pain, decr. AROM and muscle weakness at next session.   Rehab Potential Good   Clinical Impairments Affecting Rehab Potential Positive: receptive to therapy. Negative: CVA, obesity    PT Frequency 2x / week   PT Duration 4 weeks   PT Treatment/Interventions Therapeutic exercise;Manual techniques;Aquatic Therapy;Neuromuscular re-education;ADLs/Self Care Home Management;Patient/family education      Patient will benefit from skilled therapeutic intervention in order to improve the following deficits and impairments:  Decreased range of motion,  Decreased mobility, Improper body mechanics, Pain, Hypomobility, Decreased strength  Visit Diagnosis: Pain in left shoulder  Stiffness of cervical spine     Problem List Patient Active Problem List   Diagnosis Date Noted  . Carotid artery narrowing 08/09/2015  . Syncope and collapse 07/30/2015  . Allergic rhinitis 01/05/2015  . Cerebral infarct ([place]) 01/05/2015  . Accelerated essential hypertension 01/05/2015  . Benign gastric polyp 01/05/2015  . Hypercholesteremia 01/05/2015  . Neuropathy ([place]) 01/05/2015  . Adiposity 01/05/2015  . Arthritis of knee, degenerative 01/05/2015  . Borderline diabetes 01/05/2015  . Temporary cerebral vascular dysfunction 01/05/2015  . GERD (gastroesophageal reflux disease) 03/31/2013  . Gastric polyp     Fisher,Benjamin PT DPT 01/10/2016, 4:14 PM  [place] [facility] [address] [place], [address] Phone: [phone]   Fax:  [phone]  Name: KATALEYAH TINDELL MRN: [id number] Date of Birth: [birth year]

## 2016-01-13 ENCOUNTER — Ambulatory Visit: Payer: Medicare Other | Admitting: Physical Therapy

## 2016-01-13 DIAGNOSIS — M25512 Pain in left shoulder: Secondary | ICD-10-CM

## 2016-01-13 DIAGNOSIS — M6281 Muscle weakness (generalized): Secondary | ICD-10-CM

## 2016-01-13 DIAGNOSIS — M25612 Stiffness of left shoulder, not elsewhere classified: Secondary | ICD-10-CM | POA: Diagnosis not present

## 2016-01-13 DIAGNOSIS — M436 Torticollis: Secondary | ICD-10-CM | POA: Diagnosis not present

## 2016-01-13 NOTE — Therapy (Signed)
[name] [address], [place], [address] Phone: [phone]   Fax:  [phone]  Physical Therapy Treatment  Patient Details  Name: Dawn [name] MRN: [id number] Date of Birth: [birth year] Referring Provider: Mack Guise  Encounter Date: 01/13/2016      PT End of Session - 01/13/16 1517    Visit Number 3   Number of Visits 9   Date for PT Re-Evaluation 02/01/16   Authorization Type g code   PT Start Time 1440   PT Stop Time 1522   PT Time Calculation (min) 42 min   Activity Tolerance Patient tolerated treatment well   Behavior During Therapy [facility] for tasks assessed/performed      Past Medical History  Diagnosis Date  . Arthritis   . Hypertension   . Stomach ulcer   . Acid reflux   . Gastric polyp 2014  . Stroke ([place])   . Back pain     Past Surgical History  Procedure Laterality Date  . Upper gi endoscopy  2011  . Vascular surgery Right 2010    right rental artery   . Cardiac catheterization  2007    normal, [employer] [facility] [place]   . Colonoscopy  2011, 2015    Dr. Nicolasa Ducking, Dr Melody Comas  . Cataract surgery   2013  . Laminectomy  2011    decompression   . Back surgery  2011    vertebroplasty   . Carpal tunnel release  2007  . Colon surgery  2015    lap sigmoid resection    There were no vitals filed for this visit.      Subjective Assessment - 01/13/16 1516    Subjective Pt reports no change in symptoms since previous session.   Pertinent History L shoulder pain.   Diagnostic tests radiography - negative.   Patient Stated Goals to have decr. pain, improved ability to do hair and dress.   Currently in Pain? No/denies   Pain Score 0-No pain                Objective: Today's session focused on increasing [place] joint range of motion to facilitate AROM and decrease pain. Provided posterior and inferior shoulder mobilizations to gain range of motion in flexion and  shoulder abduction. Initially pt reports sensitivity to contact with anterior shoulder but tolerates mobilization and ultimately responds with decreased sensitivity and increased range of motion in shoulder flexion. Pt also gains range of motion into abduction with inferior mobilization, stating decreased pain with increased range.   AAROM with UE ranger in flexion, abduction and IR/ER. Pt able to achieve increased shoulder flexion/abduction using ranger and following mobilization but reports irritation after several reps of IR/ER. Exercise discontinued at this date due to onset of irritation.  Seated soft tissue mobilization of upper, mid and lower trapezius and rhomboid. Pt notes increased sensitivity to inferior angle and medial border of scapula but states that with STM, her pain decreases.  Instruction and performance of seated thoracic extension x15. Pt requires verbal and tactile cueing to isolate thoracic spine.                      PT Long Term Goals - 01/04/16 1632    PT LONG TERM GOAL #1   Title Pt will report decr. pain at rest to les than 2/10 .   Baseline 7/10   Time 4   PT LONG TERM GOAL #2  Title Pt will improve shoulder AROM to 150 deg. abduction in order to be able to do hair.   Baseline 90-100 deg.   Time 4   PT LONG TERM GOAL #3   Title Pt will improve cervical ROM to participate in driving activity.               Plan - 01/13/16 1522    Clinical Impression Statement Pt has sensitivity to palpation of anterior shoulder, upper, mid and low trap and rhomboids.Pt responds to posterior and inferior shoulder mobs with increased range and decreased overall sensitivity. She is able to achieve increased range of motion in shoulder abduction and flexion following manual therapy techniques. Repeated AAROM ther ex in ER or ABD is an irritant but pt tolerates AAROM shoulder flexion. Next session plan to continue to address hypomobility of shoulder to increase  range and decrease pain with inclusion of scapular mobilizations and soft tissue as needed.    Rehab Potential Good   Clinical Impairments Affecting Rehab Potential Positive: receptive to therapy. Negative: CVA, obesity    PT Frequency 2x / week   PT Duration 4 weeks   PT Treatment/Interventions Therapeutic exercise;Manual techniques;Aquatic Therapy;Neuromuscular re-education;ADLs/Self Care Home Management;Patient/family education      Patient will benefit from skilled therapeutic intervention in order to improve the following deficits and impairments:  Decreased range of motion, Decreased mobility, Improper body mechanics, Pain, Hypomobility, Decreased strength  Visit Diagnosis: Pain in left shoulder  Muscle weakness (generalized)     Problem List Patient Active Problem List   Diagnosis Date Noted  . Carotid artery narrowing 08/09/2015  . Syncope and collapse 07/30/2015  . Allergic rhinitis 01/05/2015  . Cerebral infarct ([place]) 01/05/2015  . Accelerated essential hypertension 01/05/2015  . Benign gastric polyp 01/05/2015  . Hypercholesteremia 01/05/2015  . Neuropathy ([place]) 01/05/2015  . Adiposity 01/05/2015  . Arthritis of knee, degenerative 01/05/2015  . Borderline diabetes 01/05/2015  . Temporary cerebral vascular dysfunction 01/05/2015  . GERD (gastroesophageal reflux disease) 03/31/2013  . Gastric polyp     [name] PT DPT 01/13/2016, 3:42 PM  [place] [facility] [address] [place], [address] Phone: [phone]   Fax:  [phone]  Name: Dawn [name] MRN: [id number] Date of Birth: July 12, 1934

## 2016-01-18 ENCOUNTER — Encounter: Payer: Medicare Other | Admitting: Physical Therapy

## 2016-01-18 ENCOUNTER — Ambulatory Visit: Payer: Medicare Other | Admitting: Physical Therapy

## 2016-01-18 DIAGNOSIS — M6281 Muscle weakness (generalized): Secondary | ICD-10-CM | POA: Diagnosis not present

## 2016-01-18 DIAGNOSIS — M25512 Pain in left shoulder: Secondary | ICD-10-CM

## 2016-01-18 DIAGNOSIS — M436 Torticollis: Secondary | ICD-10-CM | POA: Diagnosis not present

## 2016-01-18 DIAGNOSIS — M25612 Stiffness of left shoulder, not elsewhere classified: Secondary | ICD-10-CM | POA: Diagnosis not present

## 2016-01-19 NOTE — Therapy (Signed)
[facility] [address] [place], [address] Phone: [phone]   Fax:  [phone]  Physical Therapy Treatment  Patient Details  Name: Dawn [name] MRN: [id number] Date of Birth: 1934-03-11 Referring Provider: Mack Guise  Encounter Date: 01/18/2016      PT End of Session - 01/18/16 1637    Visit Number 4   Number of Visits 9   Date for PT Re-Evaluation 02/01/16   Authorization Type g code   PT Start Time 1110   PT Stop Time 1150   PT Time Calculation (min) 40 min   Activity Tolerance Patient tolerated treatment well   Behavior During Therapy [facility] for tasks assessed/performed      Past Medical History  Diagnosis Date  . Arthritis   . Hypertension   . Stomach ulcer   . Acid reflux   . Gastric polyp 2014  . Stroke ([place])   . Back pain     Past Surgical History  Procedure Laterality Date  . Upper gi endoscopy  2011  . Vascular surgery Right 2010    right rental artery   . Cardiac catheterization  2007    normal, [employer] [facility] [place]   . Colonoscopy  2011, 2015    Dr. Nicolasa Ducking, Dr Melody Comas  . Cataract surgery   2013  . Laminectomy  2011    decompression   . Back surgery  2011    vertebroplasty   . Carpal tunnel release  2007  . Colon surgery  2015    lap sigmoid resection    There were no vitals filed for this visit.      Subjective Assessment - 01/18/16 1633    Subjective Pt reports she is doing better, she was able to do some light lifting over the weekend with minimal pain. Does have mild pain with overhead activities.   Pertinent History L shoulder pain.   Diagnostic tests radiography - negative.   Patient Stated Goals to have decr. pain, improved ability to do hair and dress.   Currently in Pain? No/denies   Pain Score 0-No pain              Objective: PIVM/oscillatory end range supine shoulder abduction mobs in neutral, end range ER, end rage IR, 9 min. Pt  required extensive cuing and modified hand holds in order to achieve relaxation, abduction ROM improved 20 deg with this.  RTB scapular retractions, pt required extensive cuing including manual and verbal cuing in order to achieve retraction without elevation of scapula/activation of UT.  RTB low row to focus on low trap activation, with isometric 5 sec. Holds at end range.  YTB external rotation with manual correction/cuing for avoiding abduction/compensatory pattern.  Pt reported no incr. Pain at end of session, demonstrated tightness in pec minor so will look to address this at next session with doorway stretch.                   PT Education - 01/18/16 1635    Education provided Yes   Education Details RTB HEP   Person(s) Educated Patient   Methods Explanation   Comprehension Verbalized understanding             PT Long Term Goals - 01/04/16 1632    PT LONG TERM GOAL #1   Title Pt will report decr. pain at rest to les than 2/10 .   Baseline 7/10   Time 4   PT  LONG TERM GOAL #2   Title Pt will improve shoulder AROM to 150 deg. abduction in order to be able to do hair.   Baseline 90-100 deg.   Time 4   PT LONG TERM GOAL #3   Title Pt will improve cervical ROM to participate in driving activity.               Plan - 01/18/16 1638    Clinical Impression Statement Pt made improvement within session with abduction to 110 deg. is demonstrating some limitation in ER ROM which is contributing to lack of abduction. At next session will continue to work on strengthening periscapular and RTC musculature as well as joint mobs for [facility] joint.   Rehab Potential Good   Clinical Impairments Affecting Rehab Potential Positive: receptive to therapy. Negative: CVA, obesity    PT Frequency 2x / week   PT Duration 4 weeks   PT Treatment/Interventions Therapeutic exercise;Manual techniques;Aquatic Therapy;Neuromuscular re-education;ADLs/Self Care Home  Management;Patient/family education      Patient will benefit from skilled therapeutic intervention in order to improve the following deficits and impairments:  Decreased range of motion, Decreased mobility, Improper body mechanics, Pain, Hypomobility, Decreased strength  Visit Diagnosis: Muscle weakness (generalized)  Pain in left shoulder     Problem List Patient Active Problem List   Diagnosis Date Noted  . Carotid artery narrowing 08/09/2015  . Syncope and collapse 07/30/2015  . Allergic rhinitis 01/05/2015  . Cerebral infarct ([place]) 01/05/2015  . Accelerated essential hypertension 01/05/2015  . Benign gastric polyp 01/05/2015  . Hypercholesteremia 01/05/2015  . Neuropathy ([place]) 01/05/2015  . Adiposity 01/05/2015  . Arthritis of knee, degenerative 01/05/2015  . Borderline diabetes 01/05/2015  . Temporary cerebral vascular dysfunction 01/05/2015  . GERD (gastroesophageal reflux disease) 03/31/2013  . Gastric polyp     Leylany Nored PT DPT 01/19/2016, 6:56 AM  [place] [facility] [address] [place], [address] Phone: [phone]   Fax:  [phone]  Name: ELIZABET MACIA MRN: [id number] Date of Birth: [birth year]

## 2016-01-20 ENCOUNTER — Ambulatory Visit: Payer: Medicare Other | Admitting: Physical Therapy

## 2016-01-20 DIAGNOSIS — M6281 Muscle weakness (generalized): Secondary | ICD-10-CM

## 2016-01-20 DIAGNOSIS — M25612 Stiffness of left shoulder, not elsewhere classified: Secondary | ICD-10-CM | POA: Diagnosis not present

## 2016-01-20 DIAGNOSIS — M25512 Pain in left shoulder: Secondary | ICD-10-CM

## 2016-01-20 DIAGNOSIS — M436 Torticollis: Secondary | ICD-10-CM | POA: Diagnosis not present

## 2016-01-20 NOTE — Therapy (Signed)
[place] [facility] [address] [place], [address] Phone: [phone]   Fax:  [phone]  Physical Therapy Treatment  Patient Details  Name: Dawn [name] MRN: [id number] Date of Birth: [birth year] Referring Provider: Mack Guise  Encounter Date: 01/20/2016      PT End of Session - 01/20/16 1333    Visit Number 5   Number of Visits 9   Date for PT Re-Evaluation 02/01/16   Authorization Type g code   PT Start Time 1300   PT Stop Time 1340   PT Time Calculation (min) 40 min   Activity Tolerance Patient tolerated treatment well   Behavior During Therapy [facility] for tasks assessed/performed      Past Medical History  Diagnosis Date  . Arthritis   . Hypertension   . Stomach ulcer   . Acid reflux   . Gastric polyp 2014  . Stroke ([place])   . Back pain     Past Surgical History  Procedure Laterality Date  . Upper gi endoscopy  2011  . Vascular surgery Right 2010    right rental artery   . Cardiac catheterization  2007    normal, [employer] [facility] [place]   . Colonoscopy  2011, 2015    Dr. Nicolasa Ducking, Dr Melody Comas  . Cataract surgery   2013  . Laminectomy  2011    decompression   . Back surgery  2011    vertebroplasty   . Carpal tunnel release  2007  . Colon surgery  2015    lap sigmoid resection    There were no vitals filed for this visit.      Subjective Assessment - 01/20/16 1305    Subjective Pt reports she is noticing incr. ability to do her hair/incr. motion    Pertinent History L shoulder pain.   Diagnostic tests radiography - negative.   Patient Stated Goals to have decr. pain, improved ability to do hair and dress.   Currently in Pain? No/denies   Pain Score 0-No pain              Objective: Performed inferior and posterior L shoulder mobilizations to facilitate increased ROM in abduction and flexion. Pt noted mild pain with onset of mobilization due to hand placement on  anterior shoulder. Revised hand placement and continued posterior mobilization with better tolerance. Pt achieved increased ROM in shoulder abduction and flexion following posterior mobilization. Pt demonstrated increased mobility of shoulder with inferior mobilization but was not able to achieve greater ROM. Pt instructed to reach behind her back with L hand as if she was bathing. Noted decrease range with respect to horizontal abduction and performed pec minor release. Pt able to achieve greater motion toward midline when reaching behind her back. Performed on pec minor stretch and instructed on use as part of HEP. AAROM with UE ranger into flexion x 5. Pt noted increased pain with exercise, performed scapular assist to facilitate increase in range with decreased pain. Pt able to perform exercise x10 with manual assist but does note a painful arc. She is able to achieve approx 150 deg flexion with this exercise.                   PT Education - 01/20/16 1332    Education provided Yes   Education Details pec minor stretch   Person(s) Educated Patient   Methods Explanation   Comprehension Verbalized understanding  PT Long Term Goals - 01/04/16 1632    PT LONG TERM GOAL #1   Title Pt will report decr. pain at rest to les than 2/10 .   Baseline 7/10   Time 4   PT LONG TERM GOAL #2   Title Pt will improve shoulder AROM to 150 deg. abduction in order to be able to do hair.   Baseline 90-100 deg.   Time 4   PT LONG TERM GOAL #3   Title Pt will improve cervical ROM to participate in driving activity.               Plan - 01/20/16 1336    Clinical Impression Statement Pt notes improvement in her functional mobility, as she reports she is able to do her hair with more ease but states that washing her back is still difficult. She continues to be limited in ER, ABD and flexion but demonstrates improvement with joint mobilizations and manual release of pec minor.  Pt will benefit from continued therapy focusing on joint mobility, muscle length and strengtening of scapular stabilizers.   Rehab Potential Good   Clinical Impairments Affecting Rehab Potential Positive: receptive to therapy. Negative: CVA, obesity    PT Frequency 2x / week   PT Duration 4 weeks   PT Treatment/Interventions Therapeutic exercise;Manual techniques;Aquatic Therapy;Neuromuscular re-education;ADLs/Self Care Home Management;Patient/family education      Patient will benefit from skilled therapeutic intervention in order to improve the following deficits and impairments:  Decreased range of motion, Decreased mobility, Improper body mechanics, Pain, Hypomobility, Decreased strength  Visit Diagnosis: Muscle weakness (generalized)  Pain in left shoulder     Problem List Patient Active Problem List   Diagnosis Date Noted  . Carotid artery narrowing 08/09/2015  . Syncope and collapse 07/30/2015  . Allergic rhinitis 01/05/2015  . Cerebral infarct ([place]) 01/05/2015  . Accelerated essential hypertension 01/05/2015  . Benign gastric polyp 01/05/2015  . Hypercholesteremia 01/05/2015  . Neuropathy ([place]) 01/05/2015  . Adiposity 01/05/2015  . Arthritis of knee, degenerative 01/05/2015  . Borderline diabetes 01/05/2015  . Temporary cerebral vascular dysfunction 01/05/2015  . GERD (gastroesophageal reflux disease) 03/31/2013  . Gastric polyp     Mihir Flanigan PT DPT 01/20/2016, 2:31 PM  [facility] [address], [place], [address] Phone: [phone]   Fax:  [phone]  Name: Dawn [name] MRN: [id number] Date of Birth: 07-Aug-1934

## 2016-01-24 ENCOUNTER — Encounter: Payer: Self-pay | Admitting: Physical Therapy

## 2016-01-24 ENCOUNTER — Ambulatory Visit: Payer: Medicare Other | Admitting: Physical Therapy

## 2016-01-24 DIAGNOSIS — M436 Torticollis: Secondary | ICD-10-CM | POA: Diagnosis not present

## 2016-01-24 DIAGNOSIS — M25612 Stiffness of left shoulder, not elsewhere classified: Secondary | ICD-10-CM | POA: Diagnosis not present

## 2016-01-24 DIAGNOSIS — M6281 Muscle weakness (generalized): Secondary | ICD-10-CM

## 2016-01-24 DIAGNOSIS — M25512 Pain in left shoulder: Secondary | ICD-10-CM | POA: Diagnosis not present

## 2016-01-24 NOTE — Therapy (Signed)
[place] [facility] [address], [place], [address] Phone: [phone]   Fax:  [phone]  Physical Therapy Treatment  Patient Details  Name: Dawn [name] MRN: [id number] Date of Birth: [birth year] Referring Provider: Mack Guise  Encounter Date: 01/24/2016      PT End of Session - 01/24/16 1614    Visit Number 6   Number of Visits 9   Date for PT Re-Evaluation 02/01/16   Authorization Type g code   PT Start Time [id number]   PT Stop Time 1626   PT Time Calculation (min) 41 min   Activity Tolerance Patient tolerated treatment well   Behavior During Therapy [facility] for tasks assessed/performed      Past Medical History  Diagnosis Date  . Arthritis   . Hypertension   . Stomach ulcer   . Acid reflux   . Gastric polyp 2014  . Stroke ([place])   . Back pain     Past Surgical History  Procedure Laterality Date  . Upper gi endoscopy  2011  . Vascular surgery Right 2010    right rental artery   . Cardiac catheterization  2007    normal, [employer] [facility] [place]   . Colonoscopy  2011, 2015    Dr. Nicolasa Ducking, Dr Melody Comas  . Cataract surgery   2013  . Laminectomy  2011    decompression   . Back surgery  2011    vertebroplasty   . Carpal tunnel release  2007  . Colon surgery  2015    lap sigmoid resection    There were no vitals filed for this visit.      Subjective Assessment - 01/24/16 1546    Subjective Pt reports she has been doing well, but still has difficulty reaching L arm behind back (in IR) to wash.   Pertinent History L shoulder pain.   Diagnostic tests radiography - negative.   Patient Stated Goals to have decr. pain, improved ability to do hair and dress.   Currently in Pain? No/denies  no pain at rest, but 3/10 with movement      Objective: Manual: Posterior shoulder mobilizations with shoulder in ~90 degrees abduction grade III 3x30 and inferior shoulder mobilizations with shoulder in ~90  degrees abduction and with shoulder in ~90 degrees flexion grade III 3 x 30 each, to gain range of motion in flexion and shoulder abduction. Monitored symptoms throughout. PT had to change hand positions to reduce anterior shoulder tenderness. Pt able to reach further ROM before pain afterwards.  Therex: RTB scapular retractions 3x10  RTB low row 3x10 to focus on low trap activation  YTB B IR and ER 3x10 each  Cues for scapular retraction for increased stabilization, correction/cuing for avoiding abduction/compensatory pattern.  Pt c/o L shoulder pain and described it consistent with muscle soreness/fatigue after strength exercises. Pt educated on the difference between her typical joint pain and muscle soreness after exercise. After resting symptoms reduced.                           PT Education - 01/24/16 1612    Education provided Yes   Education Details continue HEP; importance of scapular strengthening for improved GH stability and reduced pain   Person(s) Educated Patient             PT Long Term Goals - 01/04/16 1632    PT LONG TERM GOAL #1   Title  Pt will report decr. pain at rest to les than 2/10 .   Baseline 7/10   Time 4   PT LONG TERM GOAL #2   Title Pt will improve shoulder AROM to 150 deg. abduction in order to be able to do hair.   Baseline 90-100 deg.   Time 4   PT LONG TERM GOAL #3   Title Pt will improve cervical ROM to participate in driving activity.               Plan - 01/24/16 1628    Clinical Impression Statement Pt continues to have end range pain with shoulder movement. She demonstrates scapular weakness effecting GH positioning and likely leading pain. She will benefit from continued scapular/shoulder strengthening bilaterally to progress towards functional goals.   Rehab Potential Good   Clinical Impairments Affecting Rehab Potential Positive: receptive to therapy. Negative: CVA, obesity    PT Frequency 2x / week    PT Duration 4 weeks   PT Treatment/Interventions Therapeutic exercise;Manual techniques;Aquatic Therapy;Neuromuscular re-education;ADLs/Self Care Home Management;Patient/family education   PT Next Visit Plan shoulder strengthening      Patient will benefit from skilled therapeutic intervention in order to improve the following deficits and impairments:  Decreased range of motion, Decreased mobility, Improper body mechanics, Pain, Hypomobility, Decreased strength  Visit Diagnosis: Muscle weakness (generalized)  Pain in left shoulder  Stiffness of cervical spine  Stiffness of left shoulder, not elsewhere classified     Problem List Patient Active Problem List   Diagnosis Date Noted  . Carotid artery narrowing 08/09/2015  . Syncope and collapse 07/30/2015  . Allergic rhinitis 01/05/2015  . Cerebral infarct ([place]) 01/05/2015  . Accelerated essential hypertension 01/05/2015  . Benign gastric polyp 01/05/2015  . Hypercholesteremia 01/05/2015  . Neuropathy ([place]) 01/05/2015  . Adiposity 01/05/2015  . Arthritis of knee, degenerative 01/05/2015  . Borderline diabetes 01/05/2015  . Temporary cerebral vascular dysfunction 01/05/2015  . GERD (gastroesophageal reflux disease) 03/31/2013  . Gastric polyp     Kasandra Fehr Shiela Mayer, PT, DPT  01/24/2016, 5:13 PM [place] [facility] [address] [place], [address] Phone: [phone]   Fax:  [phone]  Name: Dawn [name] MRN: [id number] Date of Birth: 03/23/1934

## 2016-01-27 ENCOUNTER — Ambulatory Visit: Payer: Medicare Other | Admitting: Physical Therapy

## 2016-01-27 DIAGNOSIS — M436 Torticollis: Secondary | ICD-10-CM | POA: Diagnosis not present

## 2016-01-27 DIAGNOSIS — M25612 Stiffness of left shoulder, not elsewhere classified: Secondary | ICD-10-CM | POA: Diagnosis not present

## 2016-01-27 DIAGNOSIS — M25512 Pain in left shoulder: Secondary | ICD-10-CM | POA: Diagnosis not present

## 2016-01-27 DIAGNOSIS — M6281 Muscle weakness (generalized): Secondary | ICD-10-CM | POA: Diagnosis not present

## 2016-01-27 NOTE — Therapy (Signed)
[place] [address] [place], [address] Phone: [phone]   Fax:  [phone]  Physical Therapy Treatment/Discharge  Patient Details  Name: Dawn [name] MRN: [id number] Date of Birth: [birth year] Referring Provider: Mack Guise  Encounter Date: 01/27/2016      PT End of Session - 01/27/16 1409    Visit Number 7   Number of Visits 9   Date for PT Re-Evaluation 02/01/16   Authorization Type g code   PT Start Time 0349   PT Stop Time 1425   PT Time Calculation (min) 40 min   Activity Tolerance Patient tolerated treatment well   Behavior During Therapy [facility] for tasks assessed/performed      Past Medical History  Diagnosis Date  . Arthritis   . Hypertension   . Stomach ulcer   . Acid reflux   . Gastric polyp 2014  . Stroke ([place])   . Back pain     Past Surgical History  Procedure Laterality Date  . Upper gi endoscopy  2011  . Vascular surgery Right 2010    right rental artery   . Cardiac catheterization  2007    normal, [employer] [facility] [place]   . Colonoscopy  2011, 2015    Dr. Nicolasa Ducking, Dr Melody Comas  . Cataract surgery   2013  . Laminectomy  2011    decompression   . Back surgery  2011    vertebroplasty   . Carpal tunnel release  2007  . Colon surgery  2015    lap sigmoid resection    There were no vitals filed for this visit.      Subjective Assessment - 01/27/16 1407    Subjective Pt reports she is not feeling that she is benefiting from PT at this time and would like to be discharged and will return to her MD   Pertinent History L shoulder pain.   Diagnostic tests radiography - negative.   Patient Stated Goals to have decr. pain, improved ability to do hair and dress.   Currently in Pain? Yes   Pain Score 3    Pain Location Arm   Pain Orientation Left         Objective: Pt due for reassessment and examination of shoulder ROM due to continued pain with no lasting  decrease of symptoms. Able to achieve 120 deg of shoulder abduction before onset of pain, 90 deg IR when pt relaxed but limited with AROM secondary to pain, 25 deg ER secondary to pain, 90 deg shoulder flexion limited by pain. Pt finds it consistently difficult to relax during PROM assessment with onset of pain more notable when pt is guarding or assisting in performing motion. Performed inferior and posterior glides of L shoulder to decrease pain and increase ROM. Pt responds favorably to [facility] joint compression during inferior glide and does note a decrease in painful symptoms following mobilization. Pt performed scapular retraction x10 with tendency for bilateral shoulder shrug when performing task. She noted mild pain in her shoulder during this task but responded favorably to verbal cueing to limit shoulder shrug and trap involvement. Performed thoracic extension x10, pt stating that neutral spine felt abnormal to her but was comfortable. Pt with tenderness to palpation of L trapezius with limited R cervical rotation noting an increase in tension of L trap. Performed STM to hypertonic L trapezius and levator scap with increased sensitivity in periscapular musculature. Pt notes that her sx improve with STM short  term but notes that in the past after STM the benefits only last for a day.                         PT Education - 22-Feb-2016 1408    Education provided Yes   Education Details discharge instruction   Person(s) Educated Patient   Methods Explanation   Comprehension Verbalized understanding             PT Long Term Goals - 2016/02/22 1411    PT LONG TERM GOAL #1   Title Pt will report decr. pain at rest to les than 2/10 .   Baseline 7/10   Time 4   Period Weeks   Status Not Met   PT LONG TERM GOAL #2   Title Pt will improve shoulder AROM to 150 deg. abduction in order to be able to do hair.   Baseline 90-100 deg.   Time 4   Period Weeks   Status Not Met   PT LONG  TERM GOAL #3   Title Pt will improve cervical ROM to participate in driving activity.   Status Deferred               Plan - 02-22-2016 1409    Clinical Impression Statement Pt has made no consistent gains in pain, ROM or function with PT. Unclear how carefully pt has performed HEP as she requires extensive education to perform basic movements appropriately. At this time pt is appropriate for return to MD for followup to ensure that future therapy is appropriate or to explore other avenues for treating shoulder pain.   Rehab Potential Good   Clinical Impairments Affecting Rehab Potential Positive: receptive to therapy. Negative: CVA, obesity    PT Frequency 2x / week   PT Duration 4 weeks   PT Treatment/Interventions Therapeutic exercise;Manual techniques;Aquatic Therapy;Neuromuscular re-education;ADLs/Self Care Home Management;Patient/family education   PT Next Visit Plan shoulder strengthening      Patient will benefit from skilled therapeutic intervention in order to improve the following deficits and impairments:  Decreased range of motion, Decreased mobility, Improper body mechanics, Pain, Hypomobility, Decreased strength  Visit Diagnosis: Pain in left shoulder       G-Codes - 02-22-16 1411    Functional Assessment Tool Used ROM, NPRS   Functional Limitation Carrying, moving and handling objects   Mobility: Walking and Moving Around Current Status ([id number]) At least 40 percent but less than 60 percent impaired, limited or restricted   Mobility: Walking and Moving Around Goal Status [phone]) At least 40 percent but less than 60 percent impaired, limited or restricted   Mobility: Walking and Moving Around Discharge Status [id number]) At least 40 percent but less than 60 percent impaired, limited or restricted      Problem List Patient Active Problem List   Diagnosis Date Noted  . Carotid artery narrowing 08/09/2015  . Syncope and collapse 07/30/2015  . Allergic rhinitis  01/05/2015  . Cerebral infarct ([place]) 01/05/2015  . Accelerated essential hypertension 01/05/2015  . Benign gastric polyp 01/05/2015  . Hypercholesteremia 01/05/2015  . Neuropathy ([place]) 01/05/2015  . Adiposity 01/05/2015  . Arthritis of knee, degenerative 01/05/2015  . Borderline diabetes 01/05/2015  . Temporary cerebral vascular dysfunction 01/05/2015  . GERD (gastroesophageal reflux disease) 03/31/2013  . Gastric polyp     Fisher,Benjamin PT DPT 02/22/2016, 2:24 PM  [place] [facility] [address] [place], [address] Phone: [phone]  Fax:  [phone]  Name: VITTORIA NOREEN MRN: [id number] Date of Birth: 1933/12/25

## 2016-01-31 ENCOUNTER — Ambulatory Visit: Payer: Medicare Other | Admitting: Physical Therapy

## 2016-02-03 ENCOUNTER — Ambulatory Visit: Payer: Medicare Other | Admitting: Physical Therapy

## 2016-02-03 DIAGNOSIS — M7542 Impingement syndrome of left shoulder: Secondary | ICD-10-CM | POA: Diagnosis not present

## 2016-02-15 ENCOUNTER — Other Ambulatory Visit: Payer: Self-pay

## 2016-02-15 NOTE — Telephone Encounter (Signed)
Refill request from [facility], please review-aa

## 2016-02-16 MED ORDER — SIMVASTATIN 20 MG PO TABS: 20.0000 mg | ORAL_TABLET | Freq: Every day | ORAL | Status: DC

## 2016-02-16 MED ORDER — OMEPRAZOLE 20 MG PO CPDR
20.0000 mg | DELAYED_RELEASE_CAPSULE | Freq: Every day | ORAL | Status: DC
Start: 2016-02-16 — End: 2017-03-29

## 2016-02-16 MED ORDER — LABETALOL HCL 200 MG PO TABS: 200.0000 mg | ORAL_TABLET | Freq: Three times a day (TID) | ORAL | Status: DC

## 2016-04-24 ENCOUNTER — Ambulatory Visit (INDEPENDENT_AMBULATORY_CARE_PROVIDER_SITE_OTHER): Payer: Medicare Other | Admitting: Family Medicine

## 2016-04-24 VITALS — BP 132/70 | HR 68 | Temp 98.4°F | Resp 16

## 2016-04-24 DIAGNOSIS — S51812A Laceration without foreign body of left forearm, initial encounter: Secondary | ICD-10-CM | POA: Diagnosis not present

## 2016-04-24 MED ORDER — SODIUM CHLORIDE 0.9 % EX SOLN: CUTANEOUS | 0 refills | Status: DC

## 2016-04-24 NOTE — Patient Instructions (Signed)
Let us know if you see any pus. You may pull off any excess skin that is dry and brittle.

## 2016-04-24 NOTE — Progress Notes (Signed)
Subjective:     Patient ID: Dawn [name], female   DOB: 1933-12-21, 82 y.o.   MRN: [id number]  HPI  Chief Complaint  Patient presents with  . Laceration    Patient comes in office today to address cutt to her left forearm. Patient states that on Tuesday 04/18/16 she woke up in the middle of the night to go to bathroom when she hit her arm against bathroom door. Patient states that she had cutt to her arm and had placed neosporin and bandage but wound is not healing. Patient reports yesterday she had blood trickling down her hand from site of cutt. Last reported Tdap 01/09/13.     Review of Systems     Objective:   Physical Exam  Constitutional: She appears well-developed and well-nourished. No distress.  Skin:  Left forearm with skin tears x two. No purulent drainage. Bruising noted around the wound.       Assessment:    1. Skin tear of forearm without complication, left, initial encounter - SODIUM CHLORIDE, EXTERNAL, (SALINE WOUND [place]) 0.9 % SOLN; Cleanse forearm wound daily and apply dressing and bandage  Dispense: 210 mL; Refill: 0    Plan:    Discussed calling for purulent drainage or not improving.

## 2016-04-25 ENCOUNTER — Other Ambulatory Visit: Payer: Self-pay | Admitting: Family Medicine

## 2016-04-25 DIAGNOSIS — S51812A Laceration without foreign body of left forearm, initial encounter: Secondary | ICD-10-CM

## 2016-04-25 MED ORDER — SODIUM CHLORIDE 0.9 % EX SOLN: CUTANEOUS | 0 refills | Status: DC

## 2016-06-15 ENCOUNTER — Encounter: Payer: Self-pay | Admitting: Family Medicine

## 2016-06-15 ENCOUNTER — Ambulatory Visit (INDEPENDENT_AMBULATORY_CARE_PROVIDER_SITE_OTHER): Payer: Medicare Other | Admitting: Family Medicine

## 2016-06-15 VITALS — BP 142/60 | HR 72 | Temp 97.7°F | Resp 16 | Ht 61.0 in | Wt 208.0 lb

## 2016-06-15 DIAGNOSIS — M545 Low back pain, unspecified: Secondary | ICD-10-CM

## 2016-06-15 DIAGNOSIS — G8929 Other chronic pain: Secondary | ICD-10-CM | POA: Diagnosis not present

## 2016-06-15 DIAGNOSIS — I10 Essential (primary) hypertension: Secondary | ICD-10-CM

## 2016-06-15 DIAGNOSIS — Z1231 Encounter for screening mammogram for malignant neoplasm of breast: Secondary | ICD-10-CM

## 2016-06-15 DIAGNOSIS — Z0000 Encounter for general adult medical examination without abnormal findings: Secondary | ICD-10-CM

## 2016-06-15 DIAGNOSIS — Z1239 Encounter for other screening for malignant neoplasm of breast: Secondary | ICD-10-CM

## 2016-06-15 NOTE — Progress Notes (Signed)
Patient: Dawn [name], Female    DOB: 03-28-1934, 82 y.o.   MRN: [id number] Visit Date: 06/15/2016  Today's Provider: Wilhemena Durie, MD   Chief Complaint  Patient presents with  . Medicare Wellness   Subjective:   Dawn [name] is a 82 y.o. female who presents today for her Subsequent Annual Wellness Visit. She feels poorly. Due to back pain and light headedness She reports she is not exercising. She reports she is sleeping fairly well. She does have chronic low back pain with no radicular symptoms.  Colonoscopy- 12/04/12 Mammogram- 09/08/14 BMD- 03/25/04  Review of Systems  Constitutional: Negative.   HENT: Positive for rhinorrhea, sinus pressure and sneezing.   Eyes: Positive for discharge and itching.  Respiratory: Negative.   Cardiovascular: Positive for leg swelling.  Gastrointestinal: Negative.   Endocrine: Negative.   Genitourinary: Negative.   Musculoskeletal: Positive for arthralgias and back pain.  Skin: Negative.   Allergic/Immunologic: Negative.   Neurological: Positive for dizziness and headaches.  Hematological: Bruises/bleeds easily.  Psychiatric/Behavioral: Negative.     Patient Active Problem List   Diagnosis Date Noted  . Carotid artery narrowing 08/09/2015  . Syncope and collapse 07/30/2015  . Allergic rhinitis 01/05/2015  . Cerebral infarct ([place]) 01/05/2015  . Accelerated essential hypertension 01/05/2015  . Benign gastric polyp 01/05/2015  . Hypercholesteremia 01/05/2015  . Neuropathy ([place]) 01/05/2015  . Obesity 01/05/2015  . Arthritis of knee, degenerative 01/05/2015  . Borderline diabetes 01/05/2015  . Temporary cerebral vascular dysfunction 01/05/2015  . GERD (gastroesophageal reflux disease) 03/31/2013  . Gastric polyp     Social History   Social History  . Marital status: Divorced    Spouse name: none  . Number of children: 1  . Years of education: 51   Occupational History  . retired    Social History Main Topics  .  Smoking status: Never Smoker  . Smokeless tobacco: Never Used  . Alcohol use No  . Drug use: No  . Sexual activity: No   Other Topics Concern  . Not on file   Social History Narrative  . No narrative on file    Past Surgical History:  Procedure Laterality Date  . BACK SURGERY  2011   vertebroplasty   . CARDIAC CATHETERIZATION  2007   [facility] [employer] [facility] [place]   . CARPAL TUNNEL RELEASE  2007  . cataract surgery   2013  . COLON SURGERY  2015   lap sigmoid resection  . COLONOSCOPY  2011, 2015   Dr. Nicolasa Ducking, Dr Melody Comas  . LAMINECTOMY  2011   decompression   . UPPER GI ENDOSCOPY  2011  . VASCULAR SURGERY Right 2010   right rental artery     Her family history includes Atrial fibrillation in her mother; Breast cancer in her paternal aunt, paternal aunt, paternal aunt, paternal aunt, and paternal grandmother; Cancer in her father; Colon polyps in her brother and sister; Diabetes in her brother and sister; Emphysema in her father; Heart disease in her brother, brother, and sister; Hypertension in her brother, brother, brother, mother, sister, and sister; Irritable bowel syndrome in her sister; Neuropathy in her brother; Post-traumatic stress disorder in her brother; Skin cancer in her sister; Thyroid disease in her sister.    Outpatient Medications Prior to Visit  Medication Sig Dispense Refill  . acetaminophen (TYLENOL) 325 MG tablet Take 650 mg by mouth every 6 (six) hours as needed for mild pain.    [name] aspirin EC 81 MG tablet Take  81 mg by mouth daily.    . Biotin 5000 MCG CAPS Take 5,000 mcg by mouth daily.    . Calcium Carbonate-Vitamin D (CALCIUM 600+D) 600-200 MG-UNIT TABS Take 1 tablet by mouth daily.    [name] co-enzyme Q-10 30 MG capsule Take 30 mg by mouth 3 (three) times daily.    . fexofenadine (ALLEGRA) 180 MG tablet Take 180 mg by mouth daily.    [name] labetalol (NORMODYNE) 200 MG tablet Take 1 tablet (200 mg total) by mouth 3 (three) times daily. 270 tablet 3  .  Magnesium 250 MG TABS Take by mouth 1 day or 1 dose.    . Olmesartan-Amlodipine-HCTZ 40-10-25 MG TABS TAKE 1 TABLET BY MOUTH EVERY DAY 30 tablet 12  . omeprazole (PRILOSEC) 20 MG capsule Take 1 capsule (20 mg total) by mouth daily. 90 capsule 3  . ranitidine (ZANTAC) 150 MG tablet Take 150 mg by mouth at bedtime as needed for heartburn.    . simvastatin (ZOCOR) 20 MG tablet Take 1 tablet (20 mg total) by mouth daily. Reported on 11/02/2015 (Patient taking differently: Take 10 mg by mouth daily. Reported on 11/02/2015) 30 tablet 12  . SODIUM CHLORIDE, EXTERNAL, (SALINE WOUND [place]) 0.9 % SOLN Cleanse forearm wound daily and apply dressing and bandage 210 mL 0  . traMADol (ULTRAM) 50 MG tablet Take 1 tablet (50 mg total) by mouth every 4 (four) hours as needed. 100 tablet 5   No facility-administered medications prior to visit.     Allergies  Allergen Reactions  . Quinine Anaphylaxis and Swelling  . Statins     She has tried several and has had issues with them like joint pain  . Celecoxib Rash  . Codeine Rash    Patient Care Team: Jerrol Banana., MD as PCP - General (Unknown Physician Specialty) Christene Lye, MD as Consulting Physician (General Surgery)  Objective:   Vitals:  Vitals:   06/15/16 1411  BP: (!) 142/60  Pulse: 72  Resp: 16  Temp: 97.7 F (36.5 C)  TempSrc: Oral  Weight: 208 lb (94.3 kg)  Height: 5\' 1"  (1.549 m)    Physical Exam  Constitutional: She is oriented to person, place, and time. She appears well-developed and well-nourished.  HENT:  Head: Normocephalic and atraumatic.  Right Ear: External ear normal.  Left Ear: External ear normal.  Nose: Nose normal.  Mouth/Throat: Oropharynx is clear and moist.  Eyes: Conjunctivae and EOM are normal. Pupils are equal, round, and reactive to light.  Neck: Normal range of motion. Neck supple.  Cardiovascular: Normal rate, regular rhythm, normal heart sounds and intact distal pulses.    Pulmonary/Chest: Effort normal and breath sounds normal.  Abdominal: Soft. Bowel sounds are normal.  Musculoskeletal: Normal range of motion. She exhibits edema (trace).  Neurological: She is alert and oriented to person, place, and time. She has normal reflexes.  Skin: Skin is warm and dry.  Psychiatric: She has a normal mood and affect. Her behavior is normal. Judgment and thought content normal.    Activities of Daily Living In your present state of health, do you have any difficulty performing the following activities: 06/15/2016 07/30/2015  Hearing? N N  Vision? N N  Difficulty concentrating or making decisions? N N  Walking or climbing stairs? Y Y  Dressing or bathing? N N  Doing errands, shopping? Y N  Some recent data might be hidden    Fall Risk Assessment Fall Risk  06/15/2016 06/16/2015 02/17/2015  Falls  in the past year? Yes No No  Number falls in past yr: 1 - -  Injury with Fall? No - -  Risk for fall due to : - - Impaired balance/gait     Depression Screen PHQ 2/9 Scores 06/15/2016 06/16/2015 02/17/2015  PHQ - 2 Score 0 0 0    Cognitive Testing - 6-CIT    Year: 0 points  Month: 0 points  Memorize "Pia Mau, [address], [place]"  Time (within 1 hour:) 0 points  Count backwards from 20: 0 points  Name months of year: 0 points  Repeat Address: 2 points   Total Score: 2/28  Interpretation : Normal (0-7) Abnormal (8-28)    Assessment & Plan:     Annual Wellness Visit  Reviewed patient's Family Medical History Reviewed and updated list of patient's medical providers Assessment of cognitive impairment was done Assessed patient's functional ability Established a written schedule for health screening [place] Completed and Reviewed  Exercise Activities and Dietary recommendations Goals    None      Immunization History  Administered Date(s) Administered  . Influenza, High Dose Seasonal PF 06/16/2015  . Pneumococcal  Conjugate-13 02/17/2015  . Pneumococcal Polysaccharide-23 08/14/2012    Health Maintenance  Topic Date Due  . TETANUS/TDAP  03/07/1955  . ZOSTAVAX  03/06/1996  . INFLUENZA VACCINE  04/11/2016  . DEXA SCAN  Completed  . PNA vac Low Risk Adult  Completed   1. Medicare annual wellness visit, subsequent   2. Chronic left-sided low back pain without sciatica  - Ambulatory referral to Physical Therapy 3. URI Viral. Fluids and rest. Flu shot in 2 weeks. Follow up in a couple months for routine labs and 2 week for flu clinic.  4.HTN 5.Obesity Discussed health benefits of physical activity, and encouraged her to engage in regular exercise appropriate for her age and condition.   I have done the exam and reviewed the above chart and it is accurate to the best of my knowledge.  Miguel Aschoff MD [facility] 06/15/2016 2:16 PM  ------------------------------------------------------------------------------------------------------------

## 2016-06-26 DIAGNOSIS — E7800 Pure hypercholesterolemia, unspecified: Secondary | ICD-10-CM | POA: Diagnosis not present

## 2016-06-26 DIAGNOSIS — K219 Gastro-esophageal reflux disease without esophagitis: Secondary | ICD-10-CM | POA: Diagnosis not present

## 2016-06-26 DIAGNOSIS — I10 Essential (primary) hypertension: Secondary | ICD-10-CM | POA: Diagnosis not present

## 2016-06-26 DIAGNOSIS — I6523 Occlusion and stenosis of bilateral carotid arteries: Secondary | ICD-10-CM | POA: Diagnosis not present

## 2016-06-26 DIAGNOSIS — Z6841 Body Mass Index (BMI) 40.0 and over, adult: Secondary | ICD-10-CM | POA: Diagnosis not present

## 2016-06-29 ENCOUNTER — Ambulatory Visit (INDEPENDENT_AMBULATORY_CARE_PROVIDER_SITE_OTHER): Payer: Medicare Other

## 2016-06-29 DIAGNOSIS — Z23 Encounter for immunization: Secondary | ICD-10-CM

## 2016-07-18 ENCOUNTER — Ambulatory Visit: Payer: Medicare Other | Attending: Family Medicine

## 2016-07-18 VITALS — BP 142/51 | HR 59

## 2016-07-18 DIAGNOSIS — G8929 Other chronic pain: Secondary | ICD-10-CM | POA: Diagnosis not present

## 2016-07-18 DIAGNOSIS — R262 Difficulty in walking, not elsewhere classified: Secondary | ICD-10-CM | POA: Diagnosis not present

## 2016-07-18 DIAGNOSIS — M6281 Muscle weakness (generalized): Secondary | ICD-10-CM | POA: Diagnosis not present

## 2016-07-18 DIAGNOSIS — M545 Low back pain: Secondary | ICD-10-CM | POA: Insufficient documentation

## 2016-07-18 NOTE — Therapy (Signed)
[facility] [address], [place], [address] Phone: [phone]   Fax:  [phone]  Physical Therapy Evaluation  Patient Details  Name: Dawn [name] MRN: [id number] Date of Birth: 04/02/1934 Referring Provider: Maddie Brazier Aschoff, MD  Encounter Date: 07/18/2016      PT End of Session - 07/18/16 1436    Visit Number 1   Number of Visits 17   Date for PT Re-Evaluation 09/14/16   Authorization Type 1   Authorization Time Period of 10 g - code   PT Start Time 1436   PT Stop Time 1532   PT Time Calculation (min) 56 min   Activity Tolerance Patient tolerated treatment well   Behavior During Therapy [facility] for tasks assessed/performed      Past Medical History:  Diagnosis Date  . Acid reflux   . Arthritis   . Back pain   . Gastric polyp 2014  . Hypertension   . Stomach ulcer   . Stroke [facility])     Past Surgical History:  Procedure Laterality Date  . BACK SURGERY  2011   vertebroplasty   . CARDIAC CATHETERIZATION  2007   normal, [employer] [facility] [place]   . CARPAL TUNNEL RELEASE  2007  . cataract surgery   2013  . COLON SURGERY  2015   lap sigmoid resection  . COLONOSCOPY  2011, 2015   Dr. Nicolasa Ducking, Dr Melody Comas  . LAMINECTOMY  2011   decompression   . UPPER GI ENDOSCOPY  2011  . VASCULAR SURGERY Right 2010   right rental artery     Vitals:   07/18/16 1447  BP: (!) 142/51  Pulse: (!) 59         Subjective Assessment - 07/18/16 1448    Subjective back pain: 4/10 current (pt sitting); 11/10 when standing for a long time (sitting helps)   Pertinent History Pt states falling on a concrete floor 2011. Back feels like it is hurting more and more. Dr. Rosanna Randy suggested physical therapy for her back to see if it will help. Had PT for her back before prior to falling for spinal stenosis. Pt states that PT did not help her back at that time. Also went to the Chiropractic for treatment (prior to  her fall) which involved lumbar traction. Pt felt that the treatment helped initially but overall no change. Had back surgery afterwards (June 2011, and October 2011; both surgeries were at [facility]) to her L4/L5 afterwards which helped her walk.    Has not had PT for her back since after the surgery.   Currently has difficulty standing due to pain. Difficulty doing dishes, cooking, walking, buying groceries.  Pt states having bladder leakage (Dr. Rosanna Randy aware). Has a hx of colon surgery in which the bladder had to be repaired. Had 2 strokes which affected her L UE and L LE.   Denies saddle anesthesia.    Patient Stated Goals "I would like to be able to walk better, stand longer, go out and do things, go shopping."   Currently in Pain? Yes   Pain Score 4    Pain Location Back   Pain Orientation Lower   Pain Descriptors / Indicators Burning   Pain Type Chronic pain   Pain Onset More than a month ago   Pain Frequency Constant   Aggravating Factors  standing (2-3 min prior aggravation of back pain), walking, cooking, cleaning, grocery shopping, vacuuming, lying on her back  Pain Relieving Factors using a shopping cart, lying on her R side, or stomach, sitting            [facility] PT Assessment - 07/18/16 1440      Assessment   Medical Diagnosis Chronic L sided low back pain without sciatica   Referring Provider Demoni Parmar Aschoff, MD   Onset Date/Surgical Date 06/15/16  Date PT referral signed. Chronic condition   Prior Therapy Has had PT prior to surgery for her Low back which did not help at the time. Had chiropractic care prior to her back surgery as well with temporary relief      Precautions   Precaution Comments Hx of back surgery     Restrictions   Other Position/Activity Restrictions No known restrictions     Balance Screen   Has the patient fallen in the past 6 months No   Has the patient had a decrease in activity level because of a fear of falling?  --  Pt states having fear of  falling.   Is the patient reluctant to leave their home because of a fear of falling?  --  Pt states having fear of falling.     Home Environment   Additional Comments Patient lives in a 1 story home alone, 6 steps to enter, R rail     Prior Function   Vocation Retired   Biomedical scientist PLOF: better able to stand, walk, cook, clean, shop for groceries with less back pain     Observation/Other Assessments   Modified [place] 58%     Posture/Postural Control   Posture Comments standing: slight R trunk rotation, decreased bilateral hip extension, slight kyphosis, bilaterally protracted shoulders     AROM   Overall AROM Comments lumbar flexion performed in sitting   Lumbar Flexion WFL   Lumbar Extension limited with R low back pain   Lumbar - Right Side Bend Limited with R low back pulling   Lumbar - Left Side Bend limited   Lumbar - Right Rotation WFL with R low back pulling   Lumbar - Left Rotation WFL     PROM   Overall PROM Comments R hip extension -8 degrees   Left Hip Extension -20 with back pain     Strength   Right Hip ABduction 4/5   Left Hip ABduction 4/5     Palpation   Palpation comment TTP L greater trochanter     Ambulation/Gait   Gait Comments antalgic, decreased stance R LE, L lateral lean, forward flexed, decreased bilateral hip extension        Objectives  There-ex  Directed patient with supine L glute max squeeze with L LE straight and R knee bent to promote L hip flexor stretch 5x5 seconds for 3 sets  Reviewed and given as part of her HEP. Handout provided. Pt demonstrated and verbalized understanding    Improved exercise technique, movement at target joints, use of target muscles after mod verbal, visual, tactile cues.                         PT Education - 07/18/16 1938    Education provided Yes   Education Details ther-ex, HEP, plan of care   Person(s) Educated Patient   Methods Explanation;Demonstration;Tactile  cues;Verbal cues;Handout   Comprehension Verbalized understanding;Returned demonstration             PT Long Term Goals - 07/18/16 1548      PT LONG TERM GOAL #  1   Title Patient will have a decrease in low back pain to 5/10 or less at worst to promote ability to stand and perform tasks such as cooking and cleaning.    Baseline 11/10 back pain at worst    Time 8   Period Weeks   Status New     PT LONG TERM GOAL #2   Title Patient will improve L hip extension ROM by at least 8 degrees to promote ability to perform standing tasks.    Baseline L hip extension -20 degrees   Time 8   Period Weeks   Status New     PT LONG TERM GOAL #3   Title Patient will improve bilateral glute med strength by at least 1/2 MMT grade to promote ability to ambulate and perform standing tasks with less back pain.    Baseline 4/5 bilateral hip abduction strength   Time 8   Period Weeks   Status New     PT LONG TERM GOAL #4   Title Patient will improve her Modified Oswestry Low Back Pain Disability Questionnaire score by at least 12% as a demonstration of improved function.    Baseline 58%   Time 8   Period Weeks   Status New               Plan - 07/18/16 1537    Clinical Impression Statement Patient is an 82 year old female who came to physical therapy secondary to low back pain. She also presents with altered gait pattern and posture, hip weakness, decreased hip extension PROM as well as decreased thoracic extension, and difficulty performing functional tasks such as standing to cook, clean, and shop for groceries.  Patient will benefit from skilled physical therapy services to address the aforementioned deficits.    Rehab Potential Fair   Clinical Impairments Affecting Rehab Potential Chronicity of condition, pain, hx of CVA which affected L LE strength (per pt reports)   PT Frequency 2x / week   PT Duration 8 weeks   PT Treatment/Interventions Manual techniques;Therapeutic  exercise;Therapeutic activities;Dry needling;Neuromuscular re-education;Patient/family education;Electrical Stimulation;Aquatic Therapy   PT Next Visit Plan hip extension, hip strengthening, thoracic extension, function, modalities PRN   Consulted and Agree with Plan of Care Patient      Patient will benefit from skilled therapeutic intervention in order to improve the following deficits and impairments:  Pain, Improper body mechanics, Postural dysfunction, Decreased strength, Difficulty walking, Decreased range of motion  Visit Diagnosis: Chronic bilateral low back pain without sciatica - Plan: PT plan of care cert/re-cert  Difficulty in walking, not elsewhere classified - Plan: PT plan of care cert/re-cert  Muscle weakness (generalized) - Plan: PT plan of care cert/re-cert      G-Codes - [id number]    Functional Assessment Tool Used Modified Oswestry Low Back Pain Disability Questionnaire, patient interview, clinical presentation   Functional Limitation Mobility: Walking and moving around   Mobility: Walking and Moving Around Current Status [id number]) At least 60 percent but less than 80 percent impaired, limited or restricted   Mobility: Walking and Moving Around Goal Status [phone]) At least 20 percent but less than 40 percent impaired, limited or restricted       Problem List Patient Active Problem List   Diagnosis Date Noted  . Carotid artery narrowing 08/09/2015  . Syncope and collapse 07/30/2015  . Allergic rhinitis 01/05/2015  . Cerebral infarct ([employer]) 01/05/2015  . Accelerated essential hypertension 01/05/2015  . Benign gastric polyp 01/05/2015  .  Hypercholesteremia 01/05/2015  . Neuropathy ([place]) 01/05/2015  . Obesity 01/05/2015  . Arthritis of knee, degenerative 01/05/2015  . Borderline diabetes 01/05/2015  . Temporary cerebral vascular dysfunction 01/05/2015  . GERD (gastroesophageal reflux disease) 03/31/2013  . Gastric polyp     Joneen Boers PT, DPT    07/18/2016, 7:45 PM  [place] [address], [place], [address] Phone: [phone]   Fax:  [phone]  Name: Dawn [name] MRN: [id number] Date of Birth: 03/08/1934

## 2016-07-18 NOTE — Patient Instructions (Signed)
   Supine hip flexor stretch    Don't worry about the arrows.  With right leg bent (pillows supporting under the right knee), left knee straight on your bed, squeeze your rear end muscle together to feel a gentle stretch at the front of your left hip while counting out loud to ____5 seconds . Repeat _5___ times. Do 3 sets, perform 3 sessions per day.  [email or web address].exer.[email or web address]   Copyright  VHI. All rights reserved.

## 2016-07-19 ENCOUNTER — Ambulatory Visit
Admission: RE | Admit: 2016-07-19 | Discharge: 2016-07-19 | Disposition: A | Discharge status: Home / Self Care | Payer: Medicare Other | Source: Ambulatory Visit | Attending: Family Medicine | Admitting: Family Medicine

## 2016-07-19 DIAGNOSIS — Z1239 Encounter for other screening for malignant neoplasm of breast: Secondary | ICD-10-CM

## 2016-07-19 DIAGNOSIS — Z1231 Encounter for screening mammogram for malignant neoplasm of breast: Secondary | ICD-10-CM | POA: Diagnosis not present

## 2016-07-24 ENCOUNTER — Ambulatory Visit: Payer: Medicare Other

## 2016-07-24 DIAGNOSIS — G8929 Other chronic pain: Secondary | ICD-10-CM | POA: Diagnosis not present

## 2016-07-24 DIAGNOSIS — M545 Low back pain: Secondary | ICD-10-CM | POA: Diagnosis not present

## 2016-07-24 DIAGNOSIS — M6281 Muscle weakness (generalized): Secondary | ICD-10-CM | POA: Diagnosis not present

## 2016-07-24 DIAGNOSIS — R262 Difficulty in walking, not elsewhere classified: Secondary | ICD-10-CM

## 2016-07-24 NOTE — Therapy (Signed)
[facility] [address] [place], [address] Phone: [phone]   Fax:  [phone]  Physical Therapy Treatment  Patient Details  Name: Dawn [name] MRN: [id number] Date of Birth: [birth year]  Referring Provider: Vahe Pienta Aschoff, MD  Encounter Date: 07/24/2016      PT End of Session - 07/24/16 1535    Visit Number 2   Number of Visits 17   Date for PT Re-Evaluation 09/14/16   Authorization Type 2   Authorization Time Period of 10 g - code   PT Start Time [id number]   PT Stop Time 1616   PT Time Calculation (min) 41 min   Activity Tolerance Patient tolerated treatment well   Behavior During Therapy [facility] for tasks assessed/performed      Past Medical History:  Diagnosis Date  . Acid reflux   . Arthritis   . Back pain   . Gastric polyp 2014  . Hypertension   . Stomach ulcer   . Stroke [facility])     Past Surgical History:  Procedure Laterality Date  . BACK SURGERY  2011   vertebroplasty   . CARDIAC CATHETERIZATION  2007   [facility], [employer] [facility] [place]   . CARPAL TUNNEL RELEASE  2007  . cataract surgery   2013  . COLON SURGERY  2015   lap sigmoid resection  . COLONOSCOPY  2011, 2015   Dr. Nicolasa Ducking, Dr Melody Comas  . LAMINECTOMY  2011   decompression   . UPPER GI ENDOSCOPY  2011  . VASCULAR SURGERY Right 2010   right rental artery     There were no vitals filed for this visit.      Subjective Assessment - 07/24/16 1536    Subjective Back is not real well. 5/10 back pain currently. Was not able to do her HEP this weekend due to a funeral.    Pertinent History Pt states falling on a concrete floor 2011. Back feels like it is hurting more and more. Dr. Rosanna Randy suggested physical therapy for her back to see if it will help. Had PT for her back before prior to falling for spinal stenosis. Pt states that PT did not help her back at that time. Also went to the Chiropractic for treatment (prior to  her fall) which involved lumbar traction. Pt felt that the treatment helped initially but overall no change. Had back surgery afterwards (June 2011, and October 2011; both surgeries were at [facility]) to her L4/L5 afterwards which helped her walk.    Has not had PT for her back since after the surgery.   Currently has difficulty standing due to pain. Difficulty doing dishes, cooking, walking, buying groceries.  Pt states having bladder leakage (Dr. Rosanna Randy aware). Has a hx of colon surgery in which the bladder had to be repaired. Had 2 strokes which affected her L UE and L LE.   Denies saddle anesthesia.    Patient Stated Goals "I would like to be able to walk better, stand longer, go out and do things, go shopping."   Currently in Pain? Yes   Pain Score 5    Pain Onset More than a month ago                                 PT Education - 07/24/16 1604    Education provided Yes   Education Details ther-ex, HEP   Person(s)  Educated Patient   Methods Explanation;Demonstration;Tactile cues;Verbal cues;Handout   Comprehension Verbalized understanding;Returned demonstration       Objectives  There-ex  Directed patient with seated manually resisted L LE leg press 5x  Seated hip extension isometrics 10x2 with 5 second holds to promote glute max strength  Seated bilateral scapular retraction 10x2 with 5 second holds to promote thoracic extension  Standing glute max squeeze with bilateral UE assist from treadmill bars 8x5 seconds for 3 sets to promote glute strength and hip extension.  Reviewed and given as part of her HEP. Pt demonstrated and verbalized understanding.    Seated trunk flexion 20 seconds x 3.   Side stepping 5 ft to the L and 5 ft to the R for glute med muscle use 4x each direction. Rest break secondary to fatigue.   Then 1x more each direction   Improved exercise technique, movement at target joints, use of target muscles after mod verbal, visual,  tactile cues.     Able to feel bilateral hip flexor stretch with standing glute max squeeze exercise. Slight increase in low back discomfort with exercise. Pt was recommended to take breaks with the standing glute max squeeze HEP if it bothers her back. Pt demonstrated and verbalized understanding. Decreased low back pain/discomfort after seated gentle trunk flexion exercise.         PT Long Term Goals - 07/18/16 1548      PT LONG TERM GOAL #1   Title Patient will have a decrease in low back pain to 5/10 or less at worst to promote ability to stand and perform tasks such as cooking and cleaning.    Baseline 11/10 back pain at worst    Time 8   Period Weeks   Status New     PT LONG TERM GOAL #2   Title Patient will improve L hip extension ROM by at least 8 degrees to promote ability to perform standing tasks.    Baseline L hip extension -20 degrees   Time 8   Period Weeks   Status New     PT LONG TERM GOAL #3   Title Patient will improve bilateral glute med strength by at least 1/2 MMT grade to promote ability to ambulate and perform standing tasks with less back pain.    Baseline 4/5 bilateral hip abduction strength   Time 8   Period Weeks   Status New     PT LONG TERM GOAL #4   Title Patient will improve her Modified Oswestry Low Back Pain Disability Questionnaire score by at least 12% as a demonstration of improved function.    Baseline 58%   Time 8   Period Weeks   Status New               Plan - 07/24/16 1606    Clinical Impression Statement Able to feel bilateral hip flexor stretch with standing glute max squeeze exercise. Slight increase in low back discomfort with exercise. Pt was recommended to take breaks with the standing glute max squeeze HEP if it bothers her back. Pt demonstrated and verbalized understanding. Decreased low back pain/discomfort after seated gentle trunk flexion exercise.    Rehab Potential Fair   Clinical Impairments Affecting Rehab  Potential Chronicity of condition, pain, hx of CVA which affected L LE strength (per pt reports)   PT Frequency 2x / week   PT Duration 8 weeks   PT Treatment/Interventions Manual techniques;Therapeutic exercise;Therapeutic activities;Dry needling;Neuromuscular re-education;Patient/family education;Electrical Stimulation;Aquatic Therapy  PT Next Visit Plan hip extension, hip strengthening, thoracic extension, function, modalities PRN   Consulted and Agree with Plan of Care Patient      Patient will benefit from skilled therapeutic intervention in order to improve the following deficits and impairments:  Pain, Improper body mechanics, Postural dysfunction, Decreased strength, Difficulty walking, Decreased range of motion  Visit Diagnosis: Difficulty in walking, not elsewhere classified  Muscle weakness (generalized)  Chronic bilateral low back pain without sciatica     Problem List Patient Active Problem List   Diagnosis Date Noted  . Carotid artery narrowing 08/09/2015  . Syncope and collapse 07/30/2015  . Allergic rhinitis 01/05/2015  . Cerebral infarct ([place]) 01/05/2015  . Accelerated essential hypertension 01/05/2015  . Benign gastric polyp 01/05/2015  . Hypercholesteremia 01/05/2015  . Neuropathy ([place]) 01/05/2015  . Obesity 01/05/2015  . Arthritis of knee, degenerative 01/05/2015  . Borderline diabetes 01/05/2015  . Temporary cerebral vascular dysfunction 01/05/2015  . GERD (gastroesophageal reflux disease) 03/31/2013  . Gastric polyp     Joneen Boers PT, DPT   07/24/2016, 7:28 PM  [facility] [address], [place], [address] Phone: [phone]   Fax:  [phone]  Name: KANYLA ZEIEN MRN: [id number] Date of Birth: 1934-07-31

## 2016-07-24 NOTE — Patient Instructions (Signed)
  Standing glute max squeeze   Holding onto something sturdy for support and safety:   Squeeze rear end muscles together to feel a gentle stretch to the front part of your hips.     Hold for 5 seconds     Repeat 8 times.    Perform 3 sets daily.

## 2016-07-26 ENCOUNTER — Ambulatory Visit: Payer: Medicare Other

## 2016-07-26 DIAGNOSIS — M6281 Muscle weakness (generalized): Secondary | ICD-10-CM | POA: Diagnosis not present

## 2016-07-26 DIAGNOSIS — R262 Difficulty in walking, not elsewhere classified: Secondary | ICD-10-CM | POA: Diagnosis not present

## 2016-07-26 DIAGNOSIS — M545 Low back pain: Secondary | ICD-10-CM | POA: Diagnosis not present

## 2016-07-26 DIAGNOSIS — G8929 Other chronic pain: Secondary | ICD-10-CM | POA: Diagnosis not present

## 2016-07-26 NOTE — Therapy (Signed)
[facility] [address], [place], [address] Phone: [phone]   Fax:  [phone]  Physical Therapy Treatment  Patient Details  Name: Dawn [name] MRN: [id number] Date of Birth: 05/27/1934 Referring Provider: Consuello Lassalle Aschoff, MD  Encounter Date: 07/26/2016      PT End of Session - 07/26/16 1533    Visit Number 3   Number of Visits 17   Date for PT Re-Evaluation 09/14/16   Authorization Type 3   Authorization Time Period of 10 g - code   PT Start Time [id number]   PT Stop Time 1616   PT Time Calculation (min) 43 min   Activity Tolerance Patient tolerated treatment well   Behavior During Therapy [facility] for tasks assessed/performed      Past Medical History:  Diagnosis Date  . Acid reflux   . Arthritis   . Back pain   . Gastric polyp 2014  . Hypertension   . Stomach ulcer   . Stroke [facility])     Past Surgical History:  Procedure Laterality Date  . BACK SURGERY  2011   vertebroplasty   . CARDIAC CATHETERIZATION  2007   normal, [employer] [facility] [place]   . CARPAL TUNNEL RELEASE  2007  . cataract surgery   2013  . COLON SURGERY  2015   lap sigmoid resection  . COLONOSCOPY  2011, 2015   Dr. Nicolasa Ducking, Dr Melody Comas  . LAMINECTOMY  2011   decompression   . UPPER GI ENDOSCOPY  2011  . VASCULAR SURGERY Right 2010   right rental artery     There were no vitals filed for this visit.      Subjective Assessment - 07/26/16 1535    Subjective Back is a little sore on the L side. 5/10 soreness currenlty.    Pertinent History Pt states falling on a concrete floor 2011. Back feels like it is hurting more and more. Dr. Rosanna Randy suggested physical therapy for her back to see if it will help. Had PT for her back before prior to falling for spinal stenosis. Pt states that PT did not help her back at that time. Also went to the Chiropractic for treatment (prior to her fall) which involved lumbar traction. Pt  felt that the treatment helped initially but overall no change. Had back surgery afterwards (June 2011, and October 2011; both surgeries were at [facility]) to her L4/L5 afterwards which helped her walk.    Has not had PT for her back since after the surgery.   Currently has difficulty standing due to pain. Difficulty doing dishes, cooking, walking, buying groceries.  Pt states having bladder leakage (Dr. Rosanna Randy aware). Has a hx of colon surgery in which the bladder had to be repaired. Had 2 strokes which affected her L UE and L LE.   Denies saddle anesthesia.    Patient Stated Goals "I would like to be able to walk better, stand longer, go out and do things, go shopping."   Currently in Pain? Yes   Pain Score 5   soreness   Pain Onset More than a month ago                                 PT Education - 07/26/16 1543    Education provided Yes   Education Details ther-ex   [employer]) Educated Patient   Methods Explanation;Demonstration;Tactile cues;Verbal cues  Comprehension Returned demonstration;Verbalized understanding        Objectives  There-ex  Directed patient with seated trunk flexion rolling a physioball 5x5 seconds. L shoulder discomfort which eases with rest.   Seated bilateral scapular retraction 8x2 with 5 second holds to promote thoracic extension   Seated hip extension isometrics 10x with 5 second holds to promote glute max strength  Sitting naval ins 5 seconds x 10 to promote trunk muscle use.   Standing glute max squeeze 10x5 seconds  then with bilateral UE assist from treadmill bars 10x5 seconds, and  8x5 seconds promote glute strength and hip extension.  Standing bilateral scapular retraction with rows to neutral 10x   Then seated 10x2 to promote trunk extension and trunk muscle use.    Standing alternating toe taps onto treadmill platform with bilateral UE assist 10x2 each LE  Seated thoracic extension over chair, feet propped 5x5  seconds.     Improved exercise technique, movement at target joints, use of target muscles after mod verbal, visual, tactile cues.     Good hip flexor stretch felt with standing glute max squeeze exercise, good glute med muscle use with standing alternating toe taps onto treadmill platform. Worked on thoracic extension, glute strengthening, hip flexor stretching to help decrease low back pressure in standing.         PT Long Term Goals - 07/18/16 1548      PT LONG TERM GOAL #1   Title Patient will have a decrease in low back pain to 5/10 or less at worst to promote ability to stand and perform tasks such as cooking and cleaning.    Baseline 11/10 back pain at worst    Time 8   Period Weeks   Status New     PT LONG TERM GOAL #2   Title Patient will improve L hip extension ROM by at least 8 degrees to promote ability to perform standing tasks.    Baseline L hip extension -20 degrees   Time 8   Period Weeks   Status New     PT LONG TERM GOAL #3   Title Patient will improve bilateral glute med strength by at least 1/2 MMT grade to promote ability to ambulate and perform standing tasks with less back pain.    Baseline 4/5 bilateral hip abduction strength   Time 8   Period Weeks   Status New     PT LONG TERM GOAL #4   Title Patient will improve her Modified Oswestry Low Back Pain Disability Questionnaire score by at least 12% as a demonstration of improved function.    Baseline 58%   Time 8   Period Weeks   Status New               Plan - 07/26/16 1544    Clinical Impression Statement Good hip flexor stretch felt with standing glute max squeeze exercise, good glute med muscle use with standing alternating toe taps onto treadmill platform. Worked on thoracic extension glute strengthening, hip flexor stretching to help decrease low back pressure in standing.    Rehab Potential Fair   Clinical Impairments Affecting Rehab Potential Chronicity of condition, pain, hx of  CVA which affected L LE strength (per pt reports)   PT Frequency 2x / week   PT Duration 8 weeks   PT Treatment/Interventions Manual techniques;Therapeutic exercise;Therapeutic activities;Dry needling;Neuromuscular re-education;Patient/family education;Electrical Stimulation;Aquatic Therapy   PT Next Visit Plan hip extension, hip strengthening, thoracic extension, function, modalities PRN  Consulted and Agree with Plan of Care Patient      Patient will benefit from skilled therapeutic intervention in order to improve the following deficits and impairments:  Pain, Improper body mechanics, Postural dysfunction, Decreased strength, Difficulty walking, Decreased range of motion  Visit Diagnosis: Chronic bilateral low back pain without sciatica  Difficulty in walking, not elsewhere classified  Muscle weakness (generalized)     Problem List Patient Active Problem List   Diagnosis Date Noted  . Carotid artery narrowing 08/09/2015  . Syncope and collapse 07/30/2015  . Allergic rhinitis 01/05/2015  . Cerebral infarct ([place]) 01/05/2015  . Accelerated essential hypertension 01/05/2015  . Benign gastric polyp 01/05/2015  . Hypercholesteremia 01/05/2015  . Neuropathy ([place]) 01/05/2015  . Obesity 01/05/2015  . Arthritis of knee, degenerative 01/05/2015  . Borderline diabetes 01/05/2015  . Temporary cerebral vascular dysfunction 01/05/2015  . GERD (gastroesophageal reflux disease) 03/31/2013  . Gastric polyp    Joneen Boers PT, DPT    07/26/2016, 5:27 PM  [facility] [address] [place], [address] Phone: [phone]   Fax:  [phone]  Name: Dawn [name] MRN: [id number] Date of Birth: 11-20-1933

## 2016-08-01 ENCOUNTER — Ambulatory Visit: Payer: Medicare Other

## 2016-08-01 DIAGNOSIS — G8929 Other chronic pain: Secondary | ICD-10-CM | POA: Diagnosis not present

## 2016-08-01 DIAGNOSIS — M545 Low back pain: Secondary | ICD-10-CM

## 2016-08-01 DIAGNOSIS — M6281 Muscle weakness (generalized): Secondary | ICD-10-CM | POA: Diagnosis not present

## 2016-08-01 DIAGNOSIS — R262 Difficulty in walking, not elsewhere classified: Secondary | ICD-10-CM

## 2016-08-01 NOTE — Therapy (Signed)
[facility] [address], [place], [address] Phone: [phone]   Fax:  [phone]  Physical Therapy Treatment  Patient Details  Name: Dawn [name] MRN: [id number] Date of Birth: July 24, 1934 Referring Provider: Behr Cislo Aschoff, MD  Encounter Date: 08/01/2016      PT End of Session - 08/01/16 1431    Visit Number 4   Number of Visits 17   Date for PT Re-Evaluation 09/14/16   Authorization Type 4   Authorization Time Period of 10 g - code   PT Start Time [id number]   PT Stop Time 1518   PT Time Calculation (min) 47 min   Activity Tolerance Patient tolerated treatment well   Behavior During Therapy [facility] for tasks assessed/performed      Past Medical History:  Diagnosis Date  . Acid reflux   . Arthritis   . Back pain   . Gastric polyp 2014  . Hypertension   . Stomach ulcer   . Stroke [facility])     Past Surgical History:  Procedure Laterality Date  . BACK SURGERY  2011   vertebroplasty   . CARDIAC CATHETERIZATION  2007   normal, [employer] [facility] [place]   . CARPAL TUNNEL RELEASE  2007  . cataract surgery   2013  . COLON SURGERY  2015   lap sigmoid resection  . COLONOSCOPY  2011, 2015   Dr. Nicolasa Ducking, Dr Melody Comas  . LAMINECTOMY  2011   decompression   . UPPER GI ENDOSCOPY  2011  . VASCULAR SURGERY Right 2010   right rental artery     There were no vitals filed for this visit.      Subjective Assessment - 08/01/16 1433    Subjective Back is about the same. 4/10 currenlty. Pt states that her back surgeries did not involve fusion.    Pertinent History Pt states falling on a concrete floor 2011. Back feels like it is hurting more and more. Dr. Rosanna Randy suggested physical therapy for her back to see if it will help. Had PT for her back before prior to falling for spinal stenosis. Pt states that PT did not help her back at that time. Also went to the Chiropractic for treatment (prior to her fall)  which involved lumbar traction. Pt felt that the treatment helped initially but overall no change. Had back surgery afterwards (June 2011, and October 2011; both surgeries were at [facility]) to her L4/L5 afterwards which helped her walk.    Has not had PT for her back since after the surgery.   Currently has difficulty standing due to pain. Difficulty doing dishes, cooking, walking, buying groceries.  Pt states having bladder leakage (Dr. Rosanna Randy aware). Has a hx of colon surgery in which the bladder had to be repaired. Had 2 strokes which affected her L UE and L LE.   Denies saddle anesthesia.    Patient Stated Goals "I would like to be able to walk better, stand longer, go out and do things, go shopping."   Currently in Pain? Yes   Pain Score 4    Pain Onset More than a month ago                                 PT Education - 08/01/16 1451    Education provided Yes   Education Details ther-ex   [employer]) Educated Patient   Methods Explanation;Demonstration;Tactile cues;Verbal  cues   Comprehension Verbalized understanding;Returned demonstration        Objectives  There-ex  Directed patient with supine lower trunk rotation 1x each side. Bilateral medial thigh discomfort. Eases with rest    S/L clam shell 10x2 each side   L calf discomfort with L clamshell which disappeared with activation of glute max muscle and lifting L thigh up half way.    Supine L hip flexor stretch with L LE straight and R LE in hook lying position with glute max squeeze 10x10 seconds for 2 sets    Supine naval ins 10x5 seconds  Then with pelvic floor contraction 10x5 seconds  Then with hip fallouts 5x each LE.    Increased time secondary to emphasis on quality of movement.   Standing glute max squeeze with transversus abdominis contraction 10x5 seconds to promote hip extension, decrease low back pressure and improve standing tolerance.    Improved exercise technique, movement at  target joints, use of target muscles after mod verbal, visual, tactile cues.     Difficulty with pelvic control with L hip fallout resulting in L pelvic rotation. PT states that her back is fairly good after sitting after performing supine exercises. No back pain with standing transversus abdominis and glute max contraction exercise. Decreased back pain after session.         PT Long Term Goals - 07/18/16 1548      PT LONG TERM GOAL #1   Title Patient will have a decrease in low back pain to 5/10 or less at worst to promote ability to stand and perform tasks such as cooking and cleaning.    Baseline 11/10 back pain at worst    Time 8   Period Weeks   Status New     PT LONG TERM GOAL #2   Title Patient will improve L hip extension ROM by at least 8 degrees to promote ability to perform standing tasks.    Baseline L hip extension -20 degrees   Time 8   Period Weeks   Status New     PT LONG TERM GOAL #3   Title Patient will improve bilateral glute med strength by at least 1/2 MMT grade to promote ability to ambulate and perform standing tasks with less back pain.    Baseline 4/5 bilateral hip abduction strength   Time 8   Period Weeks   Status New     PT LONG TERM GOAL #4   Title Patient will improve her Modified Oswestry Low Back Pain Disability Questionnaire score by at least 12% as a demonstration of improved function.    Baseline 58%   Time 8   Period Weeks   Status New               Plan - 08/01/16 1452    Clinical Impression Statement Difficulty with pelvic control with L hip fallout resulting in L pelvic rotation. PT states that her back is fairly good after sitting after performing supine exercises. No back pain with standing transversus abdominis and glute max contraction exercise. Decreased back pain after session.    Rehab Potential Fair   Clinical Impairments Affecting Rehab Potential Chronicity of condition, pain, hx of CVA which affected L LE strength  (per pt reports)   PT Frequency 2x / week   PT Duration 8 weeks   PT Treatment/Interventions Manual techniques;Therapeutic exercise;Therapeutic activities;Dry needling;Neuromuscular re-education;Patient/family education;Electrical Stimulation;Aquatic Therapy   PT Next Visit Plan hip extension, hip strengthening, thoracic extension, function,  modalities PRN   Consulted and Agree with Plan of Care Patient      Patient will benefit from skilled therapeutic intervention in order to improve the following deficits and impairments:  Pain, Improper body mechanics, Postural dysfunction, Decreased strength, Difficulty walking, Decreased range of motion  Visit Diagnosis: Difficulty in walking, not elsewhere classified  Muscle weakness (generalized)  Chronic bilateral low back pain without sciatica     Problem List Patient Active Problem List   Diagnosis Date Noted  . Carotid artery narrowing 08/09/2015  . Syncope and collapse 07/30/2015  . Allergic rhinitis 01/05/2015  . Cerebral infarct ([place]) 01/05/2015  . Accelerated essential hypertension 01/05/2015  . Benign gastric polyp 01/05/2015  . Hypercholesteremia 01/05/2015  . Neuropathy ([place]) 01/05/2015  . Obesity 01/05/2015  . Arthritis of knee, degenerative 01/05/2015  . Borderline diabetes 01/05/2015  . Temporary cerebral vascular dysfunction 01/05/2015  . GERD (gastroesophageal reflux disease) 03/31/2013  . Gastric polyp     Joneen Boers PT, DPT  08/01/2016, 5:14 PM  [facility] [address] [place], [address] Phone: [phone]   Fax:  [phone]  Name: Dawn [name] MRN: [id number] Date of Birth: December 22, 1933

## 2016-08-07 ENCOUNTER — Ambulatory Visit: Payer: Medicare Other

## 2016-08-07 DIAGNOSIS — M545 Low back pain, unspecified: Secondary | ICD-10-CM

## 2016-08-07 DIAGNOSIS — G8929 Other chronic pain: Secondary | ICD-10-CM

## 2016-08-07 DIAGNOSIS — M6281 Muscle weakness (generalized): Secondary | ICD-10-CM | POA: Diagnosis not present

## 2016-08-07 DIAGNOSIS — R262 Difficulty in walking, not elsewhere classified: Secondary | ICD-10-CM

## 2016-08-07 NOTE — Therapy (Signed)
[place] [facility] [address] [place], [address] Phone: [phone]   Fax:  [phone]  Physical Therapy Treatment  Patient Details  Name: Dawn [name] MRN: [id number] Date of Birth: 1934-07-13 Referring Provider: Cameshia Cressman Aschoff, MD  Encounter Date: 08/07/2016      PT End of Session - 08/07/16 1033    Visit Number 5   Number of Visits 17   Date for PT Re-Evaluation 09/14/16   Authorization Type 5   Authorization Time Period of 10 g - code   PT Start Time 1034   PT Stop Time 1117   PT Time Calculation (min) 43 min   Activity Tolerance Patient tolerated treatment well   Behavior During Therapy [facility] for tasks assessed/performed      Past Medical History:  Diagnosis Date  . Acid reflux   . Arthritis   . Back pain   . Gastric polyp 2014  . Hypertension   . Stomach ulcer   . Stroke [facility])     Past Surgical History:  Procedure Laterality Date  . BACK SURGERY  2011   vertebroplasty   . CARDIAC CATHETERIZATION  2007   [place], [employer] [facility] [place]   . CARPAL TUNNEL RELEASE  2007  . cataract surgery   2013  . COLON SURGERY  2015   lap sigmoid resection  . COLONOSCOPY  2011, 2015   Dr. Nicolasa Ducking, Dr Melody Comas  . LAMINECTOMY  2011   decompression   . UPPER GI ENDOSCOPY  2011  . VASCULAR SURGERY Right 2010   right rental artery     There were no vitals filed for this visit.      Subjective Assessment - 08/07/16 1035    Subjective Back is not good. Monday's are bad for me. Usually does more cleaning on Saturday. Goes to church on Sunday. 6/10 back pain currently (L low back). Vacuuming, mopping, dusting tends to bother her back.    Pertinent History Pt states falling on a concrete floor 2011. Back feels like it is hurting more and more. Dr. Rosanna Randy suggested physical therapy for her back to see if it will help. Had PT for her back before prior to falling for spinal stenosis. Pt states that  PT did not help her back at that time. Also went to the Chiropractic for treatment (prior to her fall) which involved lumbar traction. Pt felt that the treatment helped initially but overall no change. Had back surgery afterwards (June 2011, and October 2011; both surgeries were at [facility]) to her L4/L5 afterwards which helped her walk.    Has not had PT for her back since after the surgery.   Currently has difficulty standing due to pain. Difficulty doing dishes, cooking, walking, buying groceries.  Pt states having bladder leakage (Dr. Rosanna Randy aware). Has a hx of colon surgery in which the bladder had to be repaired. Had 2 strokes which affected her L UE and L LE.   Denies saddle anesthesia.    Patient Stated Goals "I would like to be able to walk better, stand longer, go out and do things, go shopping."   Currently in Pain? Yes   Pain Score 6    Pain Onset More than a month ago                                 PT Education - 08/07/16 1040  Education provided Yes   Education Details ther-ex, HEP   Person(s) Educated Patient   Methods Explanation;Demonstration;Tactile cues;Verbal cues;Handout   Comprehension Verbalized understanding;Returned demonstration        Objectives  There-ex  Directed patient with seated L hip extension isometrics 10x2 with 5 second holds  Slight increase in L low back pain  Seated R hip extension isometrics 10x2 with 5 seconds  Decreased L low back pain    Reviewed and given as part of her HEP. Pt demonstrated and verbalized understanding  Standing mini lunge with one UE assist and transversus abdominis and glute muscle contraction 10x2 each LE to mimic vacuuming with trunk control. No complain of increased pain   S/L clam shell 10x2 for L side  Cramps when trying to perform for R side  Seated naval ins   With hip flexion 10x2 each side  With gentle knee extension 10x2 each LE  Seated posterior pelvic tilts  10x3  Reviewed and given as part of her HEP. Pt demonstrated and verbalized understanding.    Improved exercise technique, movement at target joints, use of target muscles after mod verbal, visual, tactile cues.    Increased symptoms with seated L hip extension isometrics which decreased with seated R hip extension isometrics. Decreased back pain after performing exercises that help decrease lumbar extension.               PT Long Term Goals - 07/18/16 1548      PT LONG TERM GOAL #1   Title Patient will have a decrease in low back pain to 5/10 or less at worst to promote ability to stand and perform tasks such as cooking and cleaning.    Baseline 11/10 back pain at worst    Time 8   Period Weeks   Status New     PT LONG TERM GOAL #2   Title Patient will improve L hip extension ROM by at least 8 degrees to promote ability to perform standing tasks.    Baseline L hip extension -20 degrees   Time 8   Period Weeks   Status New     PT LONG TERM GOAL #3   Title Patient will improve bilateral glute med strength by at least 1/2 MMT grade to promote ability to ambulate and perform standing tasks with less back pain.    Baseline 4/5 bilateral hip abduction strength   Time 8   Period Weeks   Status New     PT LONG TERM GOAL #4   Title Patient will improve her Modified Oswestry Low Back Pain Disability Questionnaire score by at least 12% as a demonstration of improved function.    Baseline 58%   Time 8   Period Weeks   Status New               Plan - 08/07/16 1040    Clinical Impression Statement Increased symptoms with seated L hip extension isometrics which decreased with seated R hip extension isometrics. Decreased back pain after performing exercises that help decrease lumbar extension.      Rehab Potential Fair   Clinical Impairments Affecting Rehab Potential Chronicity of condition, pain, hx of CVA which affected L LE strength (per pt reports)   PT Frequency 2x  / week   PT Duration 8 weeks   PT Treatment/Interventions Manual techniques;Therapeutic exercise;Therapeutic activities;Dry needling;Neuromuscular re-education;Patient/family education;Electrical Stimulation;Aquatic Therapy   PT Next Visit Plan hip extension, hip strengthening, thoracic extension, function, modalities PRN   Consulted  and Agree with Plan of Care Patient      Patient will benefit from skilled therapeutic intervention in order to improve the following deficits and impairments:  Pain, Improper body mechanics, Postural dysfunction, Decreased strength, Difficulty walking, Decreased range of motion  Visit Diagnosis: Difficulty in walking, not elsewhere classified  Muscle weakness (generalized)  Chronic bilateral low back pain without sciatica     Problem List Patient Active Problem List   Diagnosis Date Noted  . Carotid artery narrowing 08/09/2015  . Syncope and collapse 07/30/2015  . Allergic rhinitis 01/05/2015  . Cerebral infarct ([place]) 01/05/2015  . Accelerated essential hypertension 01/05/2015  . Benign gastric polyp 01/05/2015  . Hypercholesteremia 01/05/2015  . Neuropathy ([place]) 01/05/2015  . Obesity 01/05/2015  . Arthritis of knee, degenerative 01/05/2015  . Borderline diabetes 01/05/2015  . Temporary cerebral vascular dysfunction 01/05/2015  . GERD (gastroesophageal reflux disease) 03/31/2013  . Gastric polyp    Joneen Boers PT, DPT   08/07/2016, 12:52 PM  [facility] [address] [place], [address] Phone: [phone]   Fax:  [phone]  Name: ENYLA HENKIN MRN: [id number] Date of Birth: 1933-10-15

## 2016-08-07 NOTE — Patient Instructions (Addendum)
  Sitting on a chair   Squeeze your rear end muscles together   Press your right foot onto the floor   Hold for 5 seconds (breathe please)    Repeat 10 times   Perform 3 sets per day.     Reviewed and given seated posterior pelvic tilts as part of her HEP. Pt demonstrated and verbalized understanding.

## 2016-08-10 ENCOUNTER — Ambulatory Visit: Payer: Medicare Other

## 2016-08-10 DIAGNOSIS — G8929 Other chronic pain: Secondary | ICD-10-CM | POA: Diagnosis not present

## 2016-08-10 DIAGNOSIS — M6281 Muscle weakness (generalized): Secondary | ICD-10-CM

## 2016-08-10 DIAGNOSIS — R262 Difficulty in walking, not elsewhere classified: Secondary | ICD-10-CM

## 2016-08-10 DIAGNOSIS — M545 Low back pain: Secondary | ICD-10-CM | POA: Diagnosis not present

## 2016-08-10 NOTE — Patient Instructions (Signed)
   Scapular Retraction (sitting or standing)   With arms at sides, pinch shoulder blades together. Hold for 5 seconds. Breathe please Repeat __10__ times per set. Do __3__ sets per session.     Copyright  VHI. All rights reserved.

## 2016-08-10 NOTE — Therapy (Signed)
[place] [address], [place], [address] Phone: [phone]   Fax:  [phone]  Physical Therapy Treatment  Patient Details  Name: Dawn [name] MRN: [id number] Date of Birth: 1933/11/19 Referring Provider: Miguel Aschoff, MD  Encounter Date: 08/10/2016      PT End of Session - 08/10/16 1031    Visit Number 6   Number of Visits 17   Date for PT Re-Evaluation 09/14/16   Authorization Type 6   Authorization Time Period of 10 g - code   PT Start Time [id number]   PT Stop Time 1118   PT Time Calculation (min) 47 min   Activity Tolerance Patient tolerated treatment well   Behavior During Therapy [facility] for tasks assessed/performed      Past Medical History:  Diagnosis Date  . Acid reflux   . Arthritis   . Back pain   . Gastric polyp 2014  . Hypertension   . Stomach ulcer   . Stroke [facility])     Past Surgical History:  Procedure Laterality Date  . BACK SURGERY  2011   vertebroplasty   . CARDIAC CATHETERIZATION  2007   normal, [employer] [facility] [place]   . CARPAL TUNNEL RELEASE  2007  . cataract surgery   2013  . COLON SURGERY  2015   lap sigmoid resection  . COLONOSCOPY  2011, 2015   Dr. Nicolasa Ducking, Dr Melody Comas  . LAMINECTOMY  2011   decompression   . UPPER GI ENDOSCOPY  2011  . VASCULAR SURGERY Right 2010   right rental artery     There were no vitals filed for this visit.      Subjective Assessment - 08/10/16 1032    Subjective Back is so-so. Has not gotten moving good yet. Its about half way. 4/10 back pain currently.    Pertinent History Pt states falling on a concrete floor 2011. Back feels like it is hurting more and more. Dr. Rosanna Randy suggested physical therapy for her back to see if it will help. Had PT for her back before prior to falling for spinal stenosis. Pt states that PT did not help her back at that time. Also went to the Chiropractic for treatment (prior to her fall) which  involved lumbar traction. Pt felt that the treatment helped initially but overall no change. Had back surgery afterwards (June 2011, and October 2011; both surgeries were at [facility]) to her L4/L5 afterwards which helped her walk.    Has not had PT for her back since after the surgery.   Currently has difficulty standing due to pain. Difficulty doing dishes, cooking, walking, buying groceries.  Pt states having bladder leakage (Dr. Rosanna Randy aware). Has a hx of colon surgery in which the bladder had to be repaired. Had 2 strokes which affected her L UE and L LE.   Denies saddle anesthesia.    Patient Stated Goals "I would like to be able to walk better, stand longer, go out and do things, go shopping."   Currently in Pain? Yes   Pain Score 4    Pain Onset More than a month ago                                 PT Education - 08/10/16 1039    Education provided Yes   Education Details ther-ex, HEP   Person(s) Educated Patient   Methods Explanation;Demonstration;Tactile  cues;Verbal cues;Handout   Comprehension Returned demonstration;Verbalized understanding        Objectives  There-ex  Directed patient with seated R hip extension isometrics 10x3 with 5 seconds           Seated clamshell (hips less than 90 degrees flexion) resisting red band 10x3 for glute med strength.                     Seated bilateral hip adduction isometrics 10x2 with 5 second holds   Seated naval ins with pelvic floor contraction 10x5 seconds, then 3x 5 seconds. R gastroc cramp afterwards  Seated trunk flexion x 1 min. Decreased cramping sensation   Seated posterior pelvic tilts 10x3 to help decrease low back pressure  Seated bilateral scapular retraction 10x5 seconds for 3 sets to promote thoracic extension  Reviewed and given as part of her HEP. Pt demonstrated and verbalized understanding   Standing mini lunge with one UE assist and transversus abdominis and glute muscle  contraction 10x2 with R LE, and 3x with L LE  to mimic vacuuming with trunk control. No complain of increased pain   Felt dizziness. Rest break provided. Blood pressure obtained. Pt states feeling dizziness this morning off and on.   158/55, HR 67 (mechanically taken. Pt states that her levels "are usually 130/60 something".   No dizziness after rest.     Improved exercise technique, movement at target joints, use of target muscles after mod verbal, visual, tactile cues.      Worked on thoracic extension, and posterior pelvic tilting to decrease low back pressure in addition to hip strengthening to promote ability to perform functional tasks at home.           PT Long Term Goals - 07/18/16 1548      PT LONG TERM GOAL #1   Title Patient will have a decrease in low back pain to 5/10 or less at worst to promote ability to stand and perform tasks such as cooking and cleaning.    Baseline 11/10 back pain at worst    Time 8   Period Weeks   Status New     PT LONG TERM GOAL #2   Title Patient will improve L hip extension ROM by at least 8 degrees to promote ability to perform standing tasks.    Baseline L hip extension -20 degrees   Time 8   Period Weeks   Status New     PT LONG TERM GOAL #3   Title Patient will improve bilateral glute med strength by at least 1/2 MMT grade to promote ability to ambulate and perform standing tasks with less back pain.    Baseline 4/5 bilateral hip abduction strength   Time 8   Period Weeks   Status New     PT LONG TERM GOAL #4   Title Patient will improve her Modified Oswestry Low Back Pain Disability Questionnaire score by at least 12% as a demonstration of improved function.    Baseline 58%   Time 8   Period Weeks   Status New               Plan - 08/10/16 1044    Clinical Impression Statement Worked on thoracic extension, and posterior pelvic tilting to decrease low back pressure in addition to hip strengthening to promote  ability to perform functional tasks at home.    Rehab Potential Fair   Clinical Impairments Affecting Rehab Potential Chronicity of condition, pain,  hx of CVA which affected L LE strength (per pt reports)   PT Frequency 2x / week   PT Duration 8 weeks   PT Treatment/Interventions Manual techniques;Therapeutic exercise;Therapeutic activities;Dry needling;Neuromuscular re-education;Patient/family education;Electrical Stimulation;Aquatic Therapy   PT Next Visit Plan hip extension, hip strengthening, thoracic extension, function, modalities PRN   Consulted and Agree with Plan of Care Patient      Patient will benefit from skilled therapeutic intervention in order to improve the following deficits and impairments:  Pain, Improper body mechanics, Postural dysfunction, Decreased strength, Difficulty walking, Decreased range of motion  Visit Diagnosis: Difficulty in walking, not elsewhere classified  Muscle weakness (generalized)  Chronic bilateral low back pain without sciatica     Problem List Patient Active Problem List   Diagnosis Date Noted  . Carotid artery narrowing 08/09/2015  . Syncope and collapse 07/30/2015  . Allergic rhinitis 01/05/2015  . Cerebral infarct ([place]) 01/05/2015  . Accelerated essential hypertension 01/05/2015  . Benign gastric polyp 01/05/2015  . Hypercholesteremia 01/05/2015  . Neuropathy ([place]) 01/05/2015  . Obesity 01/05/2015  . Arthritis of knee, degenerative 01/05/2015  . Borderline diabetes 01/05/2015  . Temporary cerebral vascular dysfunction 01/05/2015  . GERD (gastroesophageal reflux disease) 03/31/2013  . Gastric polyp    Joneen Boers PT, DPT   08/10/2016, 1:55 PM  [place] [facility] [address], [place], [address] Phone: [phone]   Fax:  [phone]  Name: Dawn [name] MRN: [id number] Date of Birth: October 02, 1933

## 2016-08-15 ENCOUNTER — Ambulatory Visit: Payer: Medicare Other | Attending: Family Medicine

## 2016-08-15 DIAGNOSIS — G8929 Other chronic pain: Secondary | ICD-10-CM | POA: Insufficient documentation

## 2016-08-15 DIAGNOSIS — M545 Low back pain: Secondary | ICD-10-CM | POA: Diagnosis not present

## 2016-08-15 DIAGNOSIS — M6281 Muscle weakness (generalized): Secondary | ICD-10-CM | POA: Insufficient documentation

## 2016-08-15 DIAGNOSIS — R262 Difficulty in walking, not elsewhere classified: Secondary | ICD-10-CM | POA: Diagnosis not present

## 2016-08-15 NOTE — Therapy (Signed)
[place] [facility] [address] [place], [address] Phone: [phone]   Fax:  [phone]  Physical Therapy Treatment  Patient Details  Name: Dawn [name] MRN: [id number] Date of Birth: [birth year] Referring Provider: Ennis Delpozo Aschoff, MD  Encounter Date: 08/15/2016      PT End of Session - 08/15/16 1540    Visit Number 7   Number of Visits 17   Date for PT Re-Evaluation 09/14/16   Authorization Type 7   Authorization Time Period of 10 g - code   PT Start Time 1540   PT Stop Time 1629   PT Time Calculation (min) 49 min   Activity Tolerance Patient tolerated treatment well   Behavior During Therapy [facility] for tasks assessed/performed      Past Medical History:  Diagnosis Date  . Acid reflux   . Arthritis   . Back pain   . Gastric polyp 2014  . Hypertension   . Stomach ulcer   . Stroke [facility])     Past Surgical History:  Procedure Laterality Date  . BACK SURGERY  2011   vertebroplasty   . CARDIAC CATHETERIZATION  2007   normal, [employer] [facility] [place]   . CARPAL TUNNEL RELEASE  2007  . cataract surgery   2013  . COLON SURGERY  2015   lap sigmoid resection  . COLONOSCOPY  2011, 2015   Dr. Nicolasa Ducking, Dr Melody Comas  . LAMINECTOMY  2011   decompression   . UPPER GI ENDOSCOPY  2011  . VASCULAR SURGERY Right 2010   right rental artery     There were no vitals filed for this visit.      Subjective Assessment - 08/15/16 1542    Subjective Back is not bad. 3/10 currently. 7-8/10 back pain at most Sunday (pt vacuumed Saturday, went to supper at church and went to church on Sunday; some of the pews and chairs are not comfortable. Had to sit for a while. Weekends are usually the worst time).     Pertinent History Pt states falling on a concrete floor 2011. Back feels like it is hurting more and more. Dr. Rosanna Randy suggested physical therapy for her back to see if it will help. Had PT for her back before  prior to falling for spinal stenosis. Pt states that PT did not help her back at that time. Also went to the Chiropractic for treatment (prior to her fall) which involved lumbar traction. Pt felt that the treatment helped initially but overall no change. Had back surgery afterwards (June 2011, and October 2011; both surgeries were at [facility]) to her L4/L5 afterwards which helped her walk.    Has not had PT for her back since after the surgery.   Currently has difficulty standing due to pain. Difficulty doing dishes, cooking, walking, buying groceries.  Pt states having bladder leakage (Dr. Rosanna Randy aware). Has a hx of colon surgery in which the bladder had to be repaired. Had 2 strokes which affected her L UE and L LE.   Denies saddle anesthesia.    Patient Stated Goals "I would like to be able to walk better, stand longer, go out and do things, go shopping."   Currently in Pain? Yes   Pain Score 3    Pain Onset More than a month ago  PT Education - 08/15/16 1556    Education provided Yes   Education Details ther-ex   [employer]) Educated Patient   Methods Explanation;Demonstration;Tactile cues;Verbal cues   Comprehension Returned demonstration;Verbalized understanding        Objectives  There-ex  Directed patient with seated R hip extension isometrics 10x2 with 5 seconds   Seated posterior pelvic tilts 10x2 to help decrease low back pressure  SLS with opposite foot on first step 5x 5 seconds each LE to promote glute max use and hip flexor stretching for 3 sets. L low back discomfort when performing for L LE which eases with rest.   Seated bilateral scapular retraction 10x5 seconds for 2 sets  Pt was recommended to perform seated posterior pelvic tilting and scapular squeezes while sitting at church or at bible study to help with back pain.   Standing mini lunge with one UE assist and transversus abdominis contraction 5x3  each LE. Good opposite hip flexor muscle stretch felt  Standing bilateral shoulder extension with posterior pelvic tilting resisting yellow band 3x5, then 10x to promote trunk muscle use. Slight decrease in low back pain.   Seated R trunk side bend stretch 10 seconds x 5 to stretch L low back Seated trunk flexion stretch 1 min    Improved exercise technique, movement at target joints, use of target muscles after mod verbal, visual, tactile cues.      Slight increase in L low back pain with L LE SLS with glute max squeeze exercise (bilateral UE assist). Slight decrease in symptoms with standing bilateral shoulder extension resisting yellow band with posterior pelvic tilting. 4/10 back pain after session. Try adding shoulder adduction resisting theraband next visit to help improve posture.           PT Long Term Goals - 07/18/16 1548      PT LONG TERM GOAL #1   Title Patient will have a decrease in low back pain to 5/10 or less at worst to promote ability to stand and perform tasks such as cooking and cleaning.    Baseline 11/10 back pain at worst    Time 8   Period Weeks   Status New     PT LONG TERM GOAL #2   Title Patient will improve L hip extension ROM by at least 8 degrees to promote ability to perform standing tasks.    Baseline L hip extension -20 degrees   Time 8   Period Weeks   Status New     PT LONG TERM GOAL #3   Title Patient will improve bilateral glute med strength by at least 1/2 MMT grade to promote ability to ambulate and perform standing tasks with less back pain.    Baseline 4/5 bilateral hip abduction strength   Time 8   Period Weeks   Status New     PT LONG TERM GOAL #4   Title Patient will improve her Modified Oswestry Low Back Pain Disability Questionnaire score by at least 12% as a demonstration of improved function.    Baseline 58%   Time 8   Period Weeks   Status New               Plan - 08/15/16 1559    Clinical Impression  Statement Slight increase in L low back pain with L LE SLS with glute max squeeze exercise (bilateral UE assist). Slight decrease in symptoms with standing bilateral shoulder extension resisting yellow band with posterior pelvic tilting. 4/10 back pain after  session. Try adding shoulder adduction resisting theraband next visit to help improve posture.    Rehab Potential Fair   Clinical Impairments Affecting Rehab Potential Chronicity of condition, pain, hx of CVA which affected L LE strength (per pt reports)   PT Frequency 2x / week   PT Duration 8 weeks   PT Treatment/Interventions Manual techniques;Therapeutic exercise;Therapeutic activities;Dry needling;Neuromuscular re-education;Patient/family education;Electrical Stimulation;Aquatic Therapy   PT Next Visit Plan hip extension, hip strengthening, thoracic extension, function, modalities PRN   Consulted and Agree with Plan of Care Patient      Patient will benefit from skilled therapeutic intervention in order to improve the following deficits and impairments:  Pain, Improper body mechanics, Postural dysfunction, Decreased strength, Difficulty walking, Decreased range of motion  Visit Diagnosis: Difficulty in walking, not elsewhere classified  Muscle weakness (generalized)  Chronic bilateral low back pain without sciatica     Problem List Patient Active Problem List   Diagnosis Date Noted  . Carotid artery narrowing 08/09/2015  . Syncope and collapse 07/30/2015  . Allergic rhinitis 01/05/2015  . Cerebral infarct ([place]) 01/05/2015  . Accelerated essential hypertension 01/05/2015  . Benign gastric polyp 01/05/2015  . Hypercholesteremia 01/05/2015  . Neuropathy ([place]) 01/05/2015  . Obesity 01/05/2015  . Arthritis of knee, degenerative 01/05/2015  . Borderline diabetes 01/05/2015  . Temporary cerebral vascular dysfunction 01/05/2015  . GERD (gastroesophageal reflux disease) 03/31/2013  . Gastric polyp      Joneen Boers PT, DPT   08/15/2016, 4:39 PM  [place] [facility] [address] [place], [address] Phone: [phone]   Fax:  [phone]  Name: Dawn [name] MRN: [id number] Date of Birth: Mar 23, 1934

## 2016-08-17 ENCOUNTER — Ambulatory Visit: Payer: Medicare Other

## 2016-08-17 DIAGNOSIS — M6281 Muscle weakness (generalized): Secondary | ICD-10-CM

## 2016-08-17 DIAGNOSIS — R262 Difficulty in walking, not elsewhere classified: Secondary | ICD-10-CM

## 2016-08-17 DIAGNOSIS — M545 Low back pain, unspecified: Secondary | ICD-10-CM

## 2016-08-17 DIAGNOSIS — G8929 Other chronic pain: Secondary | ICD-10-CM

## 2016-08-17 NOTE — Therapy (Signed)
[facility] [facility] [address], [place], [address] Phone: [phone]   Fax:  [phone]  Physical Therapy Treatment  Patient Details  Name: Dawn [name] MRN: [id number] Date of Birth: May 20, 1934 Referring Provider: Kinzly Pierrelouis Aschoff, MD  Encounter Date: 08/17/2016      PT End of Session - 08/17/16 1432    Visit Number 8   Number of Visits 17   Date for PT Re-Evaluation 09/14/16   Authorization Type 8   Authorization Time Period of 10 g - code   PT Start Time [id number]   PT Stop Time [id number]   PT Time Calculation (min) 45 min   Activity Tolerance Patient tolerated treatment well   Behavior During Therapy [facility] for tasks assessed/performed      Past Medical History:  Diagnosis Date  . Acid reflux   . Arthritis   . Back pain   . Gastric polyp 2014  . Hypertension   . Stomach ulcer   . Stroke [facility])     Past Surgical History:  Procedure Laterality Date  . BACK SURGERY  2011   vertebroplasty   . CARDIAC CATHETERIZATION  2007   normal, [employer] [facility] [place]   . CARPAL TUNNEL RELEASE  2007  . cataract surgery   2013  . COLON SURGERY  2015   lap sigmoid resection  . COLONOSCOPY  2011, 2015   Dr. Nicolasa Ducking, Dr Melody Comas  . LAMINECTOMY  2011   decompression   . UPPER GI ENDOSCOPY  2011  . VASCULAR SURGERY Right 2010   right rental artery     There were no vitals filed for this visit.      Subjective Assessment - 08/17/16 1434    Subjective Back is pretty good. Not really having pain or discomfort. Has not really done anything today. Back was fair yesterday.    Pertinent History Pt states falling on a concrete floor 2011. Back feels like it is hurting more and more. Dr. Rosanna Randy suggested physical therapy for her back to see if it will help. Had PT for her back before prior to falling for spinal stenosis. Pt states that PT did not help her back at that time. Also went to the Chiropractic for treatment  (prior to her fall) which involved lumbar traction. Pt felt that the treatment helped initially but overall no change. Had back surgery afterwards (June 2011, and October 2011; both surgeries were at [facility]) to her L4/L5 afterwards which helped her walk.    Has not had PT for her back since after the surgery.   Currently has difficulty standing due to pain. Difficulty doing dishes, cooking, walking, buying groceries.  Pt states having bladder leakage (Dr. Rosanna Randy aware). Has a hx of colon surgery in which the bladder had to be repaired. Had 2 strokes which affected her L UE and L LE.   Denies saddle anesthesia.    Patient Stated Goals "I would like to be able to walk better, stand longer, go out and do things, go shopping."   Currently in Pain? No/denies   Pain Score 0-No pain   Pain Onset More than a month ago                                 PT Education - 08/17/16 1446    Education provided Yes   Education Details ther-ex, HEP   Person(s) Educated Patient  Methods Explanation;Demonstration;Tactile cues;Verbal cues;Handout   Comprehension Returned demonstration;Verbalized understanding        Objectives  There-ex  Directed patient with standing with bilateral scapular retraction (for thoracic extension), glute max squeeze and trunk muscle activation 10x3 with 5 second holds. Bilateral anterior hip and thigh stretch felt.   Seated thoracic extension at chair with feet propped on 3 inch step (for lumbar locked position) 10x3 with 5 second holds    Standing R shoulder adduction to decrease L trunk side bend resisting yellow band 10x5 seconds for 2 sets  Decreased L lateral lean, improved posture.   Then 10x5 seconds resisting red band   Standing bilateral shoulder extension with posterior pelvic tilting resisting yellow band 10x to promote trunk muscle use. Slight increase in L low back symptoms today  Forward step up onto first regular step with  bilateral UE assist using L LE 5x2. L low back pulling   Seated R trunk rotation 5x5 seconds (secondary to L trunk rotation posture in relation to pelvis in standing. No back pain when in R trunk rotation position. Slight L low back pulling sensation and anterior abdomen pulling sensation afterward which eased with rest and standing bilateral scapular retractions.   Standing bilateral scapular retraction 2x5 with 5 seconds    Improved exercise technique, movement at target joints, use of target muscles after mod verbal, visual, tactile cues.     Decreased L lateral lean posture with standing R shoulder adduction exercise. Continued working on thoracic extension and hip extension (glute muscle use) as well to help decrease low back pressure.         PT Long Term Goals - 07/18/16 1548      PT LONG TERM GOAL #1   Title Patient will have a decrease in low back pain to 5/10 or less at worst to promote ability to stand and perform tasks such as cooking and cleaning.    Baseline 11/10 back pain at worst    Time 8   Period Weeks   Status New     PT LONG TERM GOAL #2   Title Patient will improve L hip extension ROM by at least 8 degrees to promote ability to perform standing tasks.    Baseline L hip extension -20 degrees   Time 8   Period Weeks   Status New     PT LONG TERM GOAL #3   Title Patient will improve bilateral glute med strength by at least 1/2 MMT grade to promote ability to ambulate and perform standing tasks with less back pain.    Baseline 4/5 bilateral hip abduction strength   Time 8   Period Weeks   Status New     PT LONG TERM GOAL #4   Title Patient will improve her Modified Oswestry Low Back Pain Disability Questionnaire score by at least 12% as a demonstration of improved function.    Baseline 58%   Time 8   Period Weeks   Status New               Plan - 08/17/16 1447    Clinical Impression Statement Decreased L lateral lean posture with standing R  shoulder adduction exercise. Continued working on thoracic extension and hip extension (glute muscle use) as well to help decrease low back pressure.    Rehab Potential Fair   Clinical Impairments Affecting Rehab Potential Chronicity of condition, pain, hx of CVA which affected L LE strength (per pt reports)   PT Frequency 2x /  week   PT Duration 8 weeks   PT Treatment/Interventions Manual techniques;Therapeutic exercise;Therapeutic activities;Dry needling;Neuromuscular re-education;Patient/family education;Electrical Stimulation;Aquatic Therapy   PT Next Visit Plan hip extension, hip strengthening, thoracic extension, function, modalities PRN   Consulted and Agree with Plan of Care Patient      Patient will benefit from skilled therapeutic intervention in order to improve the following deficits and impairments:  Pain, Improper body mechanics, Postural dysfunction, Decreased strength, Difficulty walking, Decreased range of motion  Visit Diagnosis: Chronic bilateral low back pain without sciatica  Difficulty in walking, not elsewhere classified  Muscle weakness (generalized)     Problem List Patient Active Problem List   Diagnosis Date Noted  . Carotid artery narrowing 08/09/2015  . Syncope and collapse 07/30/2015  . Allergic rhinitis 01/05/2015  . Cerebral infarct ([place]) 01/05/2015  . Accelerated essential hypertension 01/05/2015  . Benign gastric polyp 01/05/2015  . Hypercholesteremia 01/05/2015  . Neuropathy ([place]) 01/05/2015  . Obesity 01/05/2015  . Arthritis of knee, degenerative 01/05/2015  . Borderline diabetes 01/05/2015  . Temporary cerebral vascular dysfunction 01/05/2015  . GERD (gastroesophageal reflux disease) 03/31/2013  . Gastric polyp    Joneen Boers PT, DPT   08/17/2016, 6:13 PM  [place] [facility] [address], [place], [address] Phone: [phone]   Fax:  [phone]  Name: LADONYA MAHOOD MRN: [id number] Date of Birth: [birth year]

## 2016-08-17 NOTE — Patient Instructions (Signed)
(  Home) Extension: Thoracic With Lumbar Lock - Sitting    Sit with back against chair, knees bent, hands folded across (not shown). Extend trunk over chair back. Hold position for __5__ seconds. Repeat ___10  times per set. Do _3___ sets per session daily.   Copyright  VHI. All rights reserved.

## 2016-08-22 ENCOUNTER — Ambulatory Visit: Payer: Medicare Other

## 2016-08-22 DIAGNOSIS — G8929 Other chronic pain: Secondary | ICD-10-CM

## 2016-08-22 DIAGNOSIS — R262 Difficulty in walking, not elsewhere classified: Secondary | ICD-10-CM

## 2016-08-22 DIAGNOSIS — M545 Low back pain: Secondary | ICD-10-CM | POA: Diagnosis not present

## 2016-08-22 DIAGNOSIS — M6281 Muscle weakness (generalized): Secondary | ICD-10-CM | POA: Diagnosis not present

## 2016-08-22 NOTE — Therapy (Signed)
[place] [facility] [address], [place], [address] Phone: [phone]   Fax:  [phone]  Physical Therapy Treatment  Patient Details  Name: Dawn [name] MRN: [id number] Date of Birth: October 02, 1933 Referring Provider: Arsh Feutz Aschoff, MD  Encounter Date: 08/22/2016      PT End of Session - 08/22/16 1437    Visit Number 9   Number of Visits 17   Date for PT Re-Evaluation 09/14/16   Authorization Type 9   Authorization Time Period of 10 g - code   PT Start Time 1436   PT Stop Time 1517   PT Time Calculation (min) 41 min   Activity Tolerance Patient tolerated treatment well   Behavior During Therapy [facility] for tasks assessed/performed      Past Medical History:  Diagnosis Date  . Acid reflux   . Arthritis   . Back pain   . Gastric polyp 2014  . Hypertension   . Stomach ulcer   . Stroke [facility])     Past Surgical History:  Procedure Laterality Date  . BACK SURGERY  2011   vertebroplasty   . CARDIAC CATHETERIZATION  2007   normal, [employer] [facility] [place]   . CARPAL TUNNEL RELEASE  2007  . cataract surgery   2013  . COLON SURGERY  2015   lap sigmoid resection  . COLONOSCOPY  2011, 2015   Dr. Nicolasa Ducking, Dr Melody Comas  . LAMINECTOMY  2011   decompression   . UPPER GI ENDOSCOPY  2011  . VASCULAR SURGERY Right 2010   right rental artery     There were no vitals filed for this visit.      Subjective Assessment - 08/22/16 1438    Subjective Sunday and Monday was pretty good for her back. L low back and L anterior thigh was hurting when she woke up this morning. Pain is better than it was this morning. Pt thinks she was on her R side when she woke up. 4/10 back and L anterior thigh currently.   7-8/10 this morning and at worst for the past week. Unusual for it to hurt waking up. Pt states that when she was standing and doing dishes yesterday, her back was starting to hurt. The posterior pelvic tilting  in standing did not really help.    Pertinent History Pt states falling on a concrete floor 2011. Back feels like it is hurting more and more. Dr. Rosanna Randy suggested physical therapy for her back to see if it will help. Had PT for her back before prior to falling for spinal stenosis. Pt states that PT did not help her back at that time. Also went to the Chiropractic for treatment (prior to her fall) which involved lumbar traction. Pt felt that the treatment helped initially but overall no change. Had back surgery afterwards (June 2011, and October 2011; both surgeries were at [facility]) to her L4/L5 afterwards which helped her walk.    Has not had PT for her back since after the surgery.   Currently has difficulty standing due to pain. Difficulty doing dishes, cooking, walking, buying groceries.  Pt states having bladder leakage (Dr. Rosanna Randy aware). Has a hx of colon surgery in which the bladder had to be repaired. Had 2 strokes which affected her L UE and L LE.   Denies saddle anesthesia.    Patient Stated Goals "I would like to be able to walk better, stand longer, go out and do things, go  shopping."   Currently in Pain? Yes   Pain Score 4    Pain Onset More than a month ago            [facility] PT Assessment - 08/22/16 1458      PROM   Left Hip Extension -13 degrees, no back pain     Strength   Right Hip ABduction 4/5   Left Hip ABduction 4+/5                             PT Education - 08/22/16 1457    Education provided Yes   Education Details ther-ex   [employer]) Educated Patient   Methods Explanation;Demonstration;Tactile cues;Verbal cues   Comprehension Returned demonstration;Verbalized understanding        Objectives  There-ex  Directed patient with seated trunk flexion 3x30 seconds   Sitting with L foot propped on a stool. Decreased low back pain.   Then with seated L hip extension isometrics 5x5 seconds. R anterior thigh discomfort which eases with  rest.   seated R hip extension isometrics 10x5 seconds   Supine L hip extension isometrics for L hip flexor stretch (L LE straight, R knee in hooklying position) 10x2 with 5 second holds  S/L L hip extension stretch 2x 5 seconds  S/L hip abduction 1x L hip, 2x R hip.   Reviewed progress/current status with L hip extension ROM and bilateral hip abduction strength.    Standing R shoulder adduction to decrease L trunk side bend resisting red band 10x5 seconds  Then with L foot propped on 3 inch step (to decrease L low back pressure) 10x5 seconds, then 8x5 seconds (fatigues)   Seated thoracic extension over chair with feet propped on 3 inch step (for lumbar locked position) 10x with 5 second holds    Improved exercise technique, movement at target joints, use of target muscles after min to mod verbal, visual, tactile cues.     Pt demonstrates improved L hip extension ROM and L glute med strength since initial evaluation. Decreased L low back pain with sitting with L foot propped on 6 inch stool. Back pain decreased to 1/10 at end of session following exercises to promote thoracic extension, decrease L trunk side bending, and decrease L low back extension.           PT Long Term Goals - 07/18/16 1548      PT LONG TERM GOAL #1   Title Patient will have a decrease in low back pain to 5/10 or less at worst to promote ability to stand and perform tasks such as cooking and cleaning.    Baseline 11/10 back pain at worst    Time 8   Period Weeks   Status New     PT LONG TERM GOAL #2   Title Patient will improve L hip extension ROM by at least 8 degrees to promote ability to perform standing tasks.    Baseline L hip extension -20 degrees   Time 8   Period Weeks   Status New     PT LONG TERM GOAL #3   Title Patient will improve bilateral glute med strength by at least 1/2 MMT grade to promote ability to ambulate and perform standing tasks with less back pain.    Baseline 4/5  bilateral hip abduction strength   Time 8   Period Weeks   Status New     PT LONG TERM GOAL #4  Title Patient will improve her Modified Oswestry Low Back Pain Disability Questionnaire score by at least 12% as a demonstration of improved function.    Baseline 58%   Time 8   Period Weeks   Status New               Plan - 08/22/16 1458    Clinical Impression Statement Pt demonstrates improved L hip extension ROM and L glute med strength since initial evaluation. Decreased L low back pain with sitting with L foot propped on 6 inch stool. Back pain decreased to 1/10 at end of session following exercises to promote thoracic extension, decrease L trunk side bending, and decrease L low back extension.    Rehab Potential Fair   Clinical Impairments Affecting Rehab Potential Chronicity of condition, pain, hx of CVA which affected L LE strength (per pt reports)   PT Frequency 2x / week   PT Duration 8 weeks   PT Treatment/Interventions Manual techniques;Therapeutic exercise;Therapeutic activities;Dry needling;Neuromuscular re-education;Patient/family education;Electrical Stimulation;Aquatic Therapy   PT Next Visit Plan hip extension, hip strengthening, thoracic extension, function, modalities PRN   Consulted and Agree with Plan of Care Patient      Patient will benefit from skilled therapeutic intervention in order to improve the following deficits and impairments:  Pain, Improper body mechanics, Postural dysfunction, Decreased strength, Difficulty walking, Decreased range of motion  Visit Diagnosis: Difficulty in walking, not elsewhere classified  Muscle weakness (generalized)  Chronic bilateral low back pain without sciatica     Problem List Patient Active Problem List   Diagnosis Date Noted  . Carotid artery narrowing 08/09/2015  . Syncope and collapse 07/30/2015  . Allergic rhinitis 01/05/2015  . Cerebral infarct ([place]) 01/05/2015  . Accelerated essential hypertension  01/05/2015  . Benign gastric polyp 01/05/2015  . Hypercholesteremia 01/05/2015  . Neuropathy ([place]) 01/05/2015  . Obesity 01/05/2015  . Arthritis of knee, degenerative 01/05/2015  . Borderline diabetes 01/05/2015  . Temporary cerebral vascular dysfunction 01/05/2015  . GERD (gastroesophageal reflux disease) 03/31/2013  . Gastric polyp    Joneen Boers PT, DPT   08/22/2016, 4:23 PM  [place] [facility] [address] [place], [address] Phone: [phone]   Fax:  [phone]  Name: Dawn [name] MRN: [id number] Date of Birth: 1933/10/22

## 2016-08-24 ENCOUNTER — Ambulatory Visit: Payer: Medicare Other

## 2016-08-24 DIAGNOSIS — M545 Low back pain: Secondary | ICD-10-CM | POA: Diagnosis not present

## 2016-08-24 DIAGNOSIS — R262 Difficulty in walking, not elsewhere classified: Secondary | ICD-10-CM

## 2016-08-24 DIAGNOSIS — M6281 Muscle weakness (generalized): Secondary | ICD-10-CM

## 2016-08-24 DIAGNOSIS — G8929 Other chronic pain: Secondary | ICD-10-CM

## 2016-08-24 NOTE — Therapy (Signed)
[place] [facility] [address], [place], [address] Phone: [phone]   Fax:  [phone]  Physical Therapy Treatment And Progress Report  Patient Details  Name: Dawn [name] MRN: [id number] Date of Birth: 1934/01/09 Referring Provider: Barth Trella Aschoff, MD  Encounter Date: 08/24/2016      PT End of Session - 08/24/16 1533    Visit Number 10   Number of Visits 17   Date for PT Re-Evaluation 09/14/16   Authorization Type 1   Authorization Time Period of 10 g - code   PT Start Time 7353   PT Stop Time 1616   PT Time Calculation (min) 43 min   Activity Tolerance Patient tolerated treatment well   Behavior During Therapy [facility] for tasks assessed/performed      Past Medical History:  Diagnosis Date  . Acid reflux   . Arthritis   . Back pain   . Gastric polyp 2014  . Hypertension   . Stomach ulcer   . Stroke [facility])     Past Surgical History:  Procedure Laterality Date  . BACK SURGERY  2011   vertebroplasty   . CARDIAC CATHETERIZATION  2007   [place], [employer] [facility] [place]   . CARPAL TUNNEL RELEASE  2007  . cataract surgery   2013  . COLON SURGERY  2015   lap sigmoid resection  . COLONOSCOPY  2011, 2015   Dr. Nicolasa Ducking, Dr Melody Comas  . LAMINECTOMY  2011   decompression   . UPPER GI ENDOSCOPY  2011  . VASCULAR SURGERY Right 2010   right rental artery     There were no vitals filed for this visit.      Subjective Assessment - 08/24/16 1534    Subjective Back is not bad today. It was a little achy after last session but it got ok. 2/10 back pain currently.    Pertinent History Pt states falling on a concrete floor 2011. Back feels like it is hurting more and more. Dr. Rosanna Randy suggested physical therapy for her back to see if it will help. Had PT for her back before prior to falling for spinal stenosis. Pt states that PT did not help her back at that time. Also went to the Chiropractic for  treatment (prior to her fall) which involved lumbar traction. Pt felt that the treatment helped initially but overall no change. Had back surgery afterwards (June 2011, and October 2011; both surgeries were at [facility]) to her L4/L5 afterwards which helped her walk.    Has not had PT for her back since after the surgery.   Currently has difficulty standing due to pain. Difficulty doing dishes, cooking, walking, buying groceries.  Pt states having bladder leakage (Dr. Rosanna Randy aware). Has a hx of colon surgery in which the bladder had to be repaired. Had 2 strokes which affected her L UE and L LE.   Denies saddle anesthesia.    Patient Stated Goals "I would like to be able to walk better, stand longer, go out and do things, go shopping."   Currently in Pain? Yes   Pain Score 2    Pain Onset More than a month ago            [facility] PT Assessment - 08/24/16 1545      Observation/Other Assessments   Modified Oswertry 42% (16% improvement since initial evaluation)     PROM   Left Hip Extension -13 degrees, no back pain  measured  08/22/16     Strength   Right Hip ABduction 4/5  measured 08/22/16   Left Hip ABduction 4+/5  measured 08/22/16                             PT Education - 08/24/16 1541    Education provided Yes   Education Details ther-ex   Person(s) Educated Patient   Methods Explanation;Demonstration;Tactile cues;Verbal cues   Comprehension Returned demonstration;Verbalized understanding        Objectives  There-ex  Directed patient with seated forward ball rollouts using physioball 10x5 seconds to promote trunk flexion and decrease low back pain.  Then to the R 10x5 seconds  Seated thoracic extension over chair with feet propped on 3 inch step (for lumbar locked position) 10x with 5 second holds   Reviewed progress/current status with PT towards goals.   Standing R shoulder adduction to decrease L trunk side bend resisting red band 6x5  seconds, with L foot propped on 3 inch step (to decrease L low back pressure)   Standing R shoulder adduction to decrease L trunk side bend resisting red band 5x10 seconds then 10x5 seconds            Sitting with L foot propped on a stool x 2 min  Then with R hip extension isometrics 10x5 seconds for 2 sets   Seated pelvic posterior/anterior tilts 10x each way.   Standing mini lunge 5x each LE with one UE assist   Pt was recommended to do chores such as vacuuming in small increments to give her back a rest as well as to keep her back from moving too much (such as performing mini lunges). Pt verbalized understanding.     Improved exercise technique, movement at target joints, use of target muscles after min to mod verbal, visual, tactile cues.     Pt demonstrates improved L hip extension ROM, L glute med strength, improved Modified Oswestry Low Back Pain Disability Questionnaire (suggesting improved function) and overall decreased back pain level at worst since initial evaluation. Pt still demonstrates back pain, hip weakness, limited hip extension ROM and difficulty performing functional tasks and would benefit from continued skilled physical therapy services to address the aforementioned deficits.           PT Long Term Goals - 08/24/16 1546      PT LONG TERM GOAL #1   Title Patient will have a decrease in low back pain to 5/10 or less at worst to promote ability to stand and perform tasks such as cooking and cleaning.    Baseline 11/10 back pain at worst; 7/10 at most for the past 7 days, has not had a busy week (3/10 on average; 08/24/16)   Time 8   Period Weeks   Status On-going     PT LONG TERM GOAL #2   Title Patient will improve L hip extension ROM by at least 8 degrees to promote ability to perform standing tasks.    Baseline L hip extension -20 degrees; -13 degrees (no back pain with measurement; 08/22/16)   Time 8   Period Weeks   Status On-going     PT LONG  TERM GOAL #3   Title Patient will improve bilateral glute med strength by at least 1/2 MMT grade to promote ability to ambulate and perform standing tasks with less back pain.    Baseline 4/5 bilateral hip abduction strength; R 4/5, L 4+/5 (08/22/16)  Time 8   Period Weeks   Status Partially Met     PT LONG TERM GOAL #4   Title Patient will improve her Modified Oswestry Low Back Pain Disability Questionnaire score by at least 12% as a demonstration of improved function.    Baseline 58%; 42% (2016/09/10)   Time 8   Period Weeks   Status Achieved     PT LONG TERM GOAL #5   Title Patient will improve her Modified Oswestry Low Back Pain Disability Questionnaire score to 34% or less as a demonstration of improved function.    Baseline 42% (10-Sep-2016)   Time 4   Period Weeks   Status New               Plan - 09/10/16 1542    Clinical Impression Statement Pt demonstrates improved L hip extension ROM, L glute med strength, improved Modified Oswestry Low Back Pain Disability Questionnaire (suggesting improved function) and overall decreased back pain level at worst since initial evaluation. Pt still demonstrates back pain, hip weakness, limited hip extension ROM and difficulty performing functional tasks and would benefit from continued skilled physical therapy services to address the aforementioned deficits.   Rehab Potential Fair   Clinical Impairments Affecting Rehab Potential Chronicity of condition, pain, hx of CVA which affected L LE strength (per pt reports)   PT Frequency 2x / week   PT Duration 8 weeks   PT Treatment/Interventions Manual techniques;Therapeutic exercise;Therapeutic activities;Dry needling;Neuromuscular re-education;Patient/family education;Electrical Stimulation;Aquatic Therapy   PT Next Visit Plan hip extension, hip strengthening, thoracic extension, function, modalities PRN   Consulted and Agree with Plan of Care Patient      Patient will benefit from skilled  therapeutic intervention in order to improve the following deficits and impairments:  Pain, Improper body mechanics, Postural dysfunction, Decreased strength, Difficulty walking, Decreased range of motion  Visit Diagnosis: Chronic bilateral low back pain without sciatica  Difficulty in walking, not elsewhere classified  Muscle weakness (generalized)       G-Codes - 09/10/2016 1918    Functional Assessment Tool Used Modified Oswestry Low Back Pain Disability Questionnaire, patient interview, clinical presentation   Functional Limitation Mobility: Walking and moving around   Mobility: Walking and Moving Around Current Status (Q2229) At least 40 percent but less than 60 percent impaired, limited or restricted   Mobility: Walking and Moving Around Goal Status [phone]) At least 20 percent but less than 40 percent impaired, limited or restricted      Problem List Patient Active Problem List   Diagnosis Date Noted  . Carotid artery narrowing 08/09/2015  . Syncope and collapse 07/30/2015  . Allergic rhinitis 01/05/2015  . Cerebral infarct ([employer]) 01/05/2015  . Accelerated essential hypertension 01/05/2015  . Benign gastric polyp 01/05/2015  . Hypercholesteremia 01/05/2015  . Neuropathy ([place]) 01/05/2015  . Obesity 01/05/2015  . Arthritis of knee, degenerative 01/05/2015  . Borderline diabetes 01/05/2015  . Temporary cerebral vascular dysfunction 01/05/2015  . GERD (gastroesophageal reflux disease) 03/31/2013  . Gastric polyp    Thank you for your referral.  Joneen Boers PT, DPT   09/10/2016, 7:24 PM  [place] [facility] [address] [place], [address] Phone: [phone]   Fax:  [phone]  Name: Dawn [name] MRN: [id number] Date of Birth: 22-May-1934

## 2016-08-28 ENCOUNTER — Ambulatory Visit: Payer: Medicare Other

## 2016-08-28 DIAGNOSIS — R262 Difficulty in walking, not elsewhere classified: Secondary | ICD-10-CM

## 2016-08-28 DIAGNOSIS — M6281 Muscle weakness (generalized): Secondary | ICD-10-CM

## 2016-08-28 DIAGNOSIS — M545 Low back pain: Secondary | ICD-10-CM | POA: Diagnosis not present

## 2016-08-28 DIAGNOSIS — G8929 Other chronic pain: Secondary | ICD-10-CM

## 2016-08-28 NOTE — Therapy (Signed)
[place] [address], [place], [address] Phone: [phone]   Fax:  [phone]  Physical Therapy Treatment  Patient Details  Name: Dawn [name] MRN: [id number] Date of Birth: 05/05/1934 Referring Provider: Marvalene Barrett Aschoff, MD  Encounter Date: 08/28/2016      PT End of Session - 08/28/16 1441    Visit Number 11   Number of Visits 17   Date for PT Re-Evaluation 09/14/16   Authorization Type 2   Authorization Time Period of 10 g - code   PT Start Time 1440   PT Stop Time 1523   PT Time Calculation (min) 43 min   Activity Tolerance Patient tolerated treatment well   Behavior During Therapy [facility] for tasks assessed/performed      Past Medical History:  Diagnosis Date  . Acid reflux   . Arthritis   . Back pain   . Gastric polyp 2014  . Hypertension   . Stomach ulcer   . Stroke [facility])     Past Surgical History:  Procedure Laterality Date  . BACK SURGERY  2011   vertebroplasty   . CARDIAC CATHETERIZATION  2007   normal, [employer] [facility] [place]   . CARPAL TUNNEL RELEASE  2007  . cataract surgery   2013  . COLON SURGERY  2015   lap sigmoid resection  . COLONOSCOPY  2011, 2015   Dr. Nicolasa Ducking, Dr Melody Comas  . LAMINECTOMY  2011   decompression   . UPPER GI ENDOSCOPY  2011  . VASCULAR SURGERY Right 2010   right rental artery     There were no vitals filed for this visit.      Subjective Assessment - 08/28/16 1441    Subjective It's not a very good day (for her back) but it will be better. 3/10 back pain currently. It's not a severe pain. It's uncomfortable. The back was actually a little better (when vacuuming). Might not have done a thorough job.   I just know my limits and I stop.    Pertinent History Pt states falling on a concrete floor 2011. Back feels like it is hurting more and more. Dr. Rosanna Randy suggested physical therapy for her back to see if it will help. Had PT for her back  before prior to falling for spinal stenosis. Pt states that PT did not help her back at that time. Also went to the Chiropractic for treatment (prior to her fall) which involved lumbar traction. Pt felt that the treatment helped initially but overall no change. Had back surgery afterwards (June 2011, and October 2011; both surgeries were at [facility]) to her L4/L5 afterwards which helped her walk.    Has not had PT for her back since after the surgery.   Currently has difficulty standing due to pain. Difficulty doing dishes, cooking, walking, buying groceries.  Pt states having bladder leakage (Dr. Rosanna Randy aware). Has a hx of colon surgery in which the bladder had to be repaired. Had 2 strokes which affected her L UE and L LE.   Denies saddle anesthesia.    Patient Stated Goals "I would like to be able to walk better, stand longer, go out and do things, go shopping."   Currently in Pain? Yes   Pain Score 3    Pain Onset More than a month ago  PT Education - 08/28/16 1444    Education provided Yes   Education Details thre-ex   [employer]) Educated Patient   Methods Explanation;Demonstration;Tactile cues;Verbal cues   Comprehension Returned demonstration;Verbalized understanding        Objectives  There-ex  Directed patient with standing alternating toe taps onto 1st regular step with bilateral UE assist 10 x 2 each LE. Felt anterior hip stretch during ipsilateral stance portion  Seated (to decrease low back extension) bilateral scapular retraction resisting red band 10x2. Pain free range for L shoulder. L hand numbness at times secondary to CVA 2 years ago per pt  seated forward ball rollouts using physioball 10x5 seconds to promote trunk flexion and decrease low back pain.             Then to the R 10x5 seconds  Standing mini lunge 5x2 each LE with one UE assist  Standing hip abduction with bilateral UE assist 10x3 each LE for  glute med strengthening  Sitting with L foot propped on a stool x 2 min to decrease low back extension pressure  Seated thoracic extension overchair with feet propped on 3 inch step (for lumbar locked position) 10x with 5 second holds for 2 sets    Improved exercise technique, movement at target joints, use of target muscles after min to mod verbal, visual, tactile cues.     Current pain level steady at around 3/10 average at start of sessions for the past few sessions (better compared to 5/10 average towards the initial appointments). 2/10 back pain after session. Continued working on thoracic extension and hip strength to help decrease low back pressure in standing.          PT Long Term Goals - 08/24/16 1546      PT LONG TERM GOAL #1   Title Patient will have a decrease in low back pain to 5/10 or less at worst to promote ability to stand and perform tasks such as cooking and cleaning.    Baseline 11/10 back pain at worst; 7/10 at most for the past 7 days, has not had a busy week (3/10 on average; 08/24/16)   Time 8   Period Weeks   Status On-going     PT LONG TERM GOAL #2   Title Patient will improve L hip extension ROM by at least 8 degrees to promote ability to perform standing tasks.    Baseline L hip extension -20 degrees; -13 degrees (no back pain with measurement; 08/22/16)   Time 8   Period Weeks   Status On-going     PT LONG TERM GOAL #3   Title Patient will improve bilateral glute med strength by at least 1/2 MMT grade to promote ability to ambulate and perform standing tasks with less back pain.    Baseline 4/5 bilateral hip abduction strength; R 4/5, L 4+/5 (08/22/16)   Time 8   Period Weeks   Status Partially Met     PT LONG TERM GOAL #4   Title Patient will improve her Modified Oswestry Low Back Pain Disability Questionnaire score by at least 12% as a demonstration of improved function.    Baseline 58%; 42% (08/24/16)   Time 8   Period Weeks    Status Achieved     PT LONG TERM GOAL #5   Title Patient will improve her Modified Oswestry Low Back Pain Disability Questionnaire score to 34% or less as a demonstration of improved function.    Baseline 42% (08/24/16)  Time 4   Period Weeks   Status New               Plan - 08/28/16 1439    Clinical Impression Statement Current pain level steady at around 3/10 average at start of sessions for the past few sessions (better compared to 5/10 average towards the initial appointments). 2/10 back pain after session. Continued working on thoracic extension and hip strength to help decrease low back pressure in standing.    Rehab Potential Fair   Clinical Impairments Affecting Rehab Potential Chronicity of condition, pain, hx of CVA which affected L LE strength (per pt reports)   PT Frequency 2x / week   PT Duration 8 weeks   PT Treatment/Interventions Manual techniques;Therapeutic exercise;Therapeutic activities;Dry needling;Neuromuscular re-education;Patient/family education;Electrical Stimulation;Aquatic Therapy   PT Next Visit Plan hip extension, hip strengthening, thoracic extension, function, modalities PRN   Consulted and Agree with Plan of Care Patient      Patient will benefit from skilled therapeutic intervention in order to improve the following deficits and impairments:  Pain, Improper body mechanics, Postural dysfunction, Decreased strength, Difficulty walking, Decreased range of motion  Visit Diagnosis: Chronic bilateral low back pain without sciatica  Difficulty in walking, not elsewhere classified  Muscle weakness (generalized)     Problem List Patient Active Problem List   Diagnosis Date Noted  . Carotid artery narrowing 08/09/2015  . Syncope and collapse 07/30/2015  . Allergic rhinitis 01/05/2015  . Cerebral infarct ([place]) 01/05/2015  . Accelerated essential hypertension 01/05/2015  . Benign gastric polyp 01/05/2015  . Hypercholesteremia 01/05/2015  .  Neuropathy ([place]) 01/05/2015  . Obesity 01/05/2015  . Arthritis of knee, degenerative 01/05/2015  . Borderline diabetes 01/05/2015  . Temporary cerebral vascular dysfunction 01/05/2015  . GERD (gastroesophageal reflux disease) 03/31/2013  . Gastric polyp    Joneen Boers PT, DPT   08/28/2016, 3:29 PM  [place] [address], [place], [address] Phone: [phone]   Fax:  [phone]  Name: Dawn [name] MRN: [id number] Date of Birth: July 15, 1934

## 2016-08-31 ENCOUNTER — Ambulatory Visit: Payer: Medicare Other

## 2016-08-31 DIAGNOSIS — R262 Difficulty in walking, not elsewhere classified: Secondary | ICD-10-CM | POA: Diagnosis not present

## 2016-08-31 DIAGNOSIS — M545 Low back pain, unspecified: Secondary | ICD-10-CM

## 2016-08-31 DIAGNOSIS — G8929 Other chronic pain: Secondary | ICD-10-CM

## 2016-08-31 DIAGNOSIS — M6281 Muscle weakness (generalized): Secondary | ICD-10-CM

## 2016-08-31 NOTE — Therapy (Signed)
[facility] [address] [place], [address] Phone: [phone]   Fax:  [phone]  Physical Therapy Treatment  Patient Details  Name: Dawn [name] MRN: [id number] Date of Birth: 23-Dec-1933 Referring Provider: Miguel Aschoff, MD  Encounter Date: 08/31/2016      PT End of Session - 08/31/16 1432    Visit Number 12   Number of Visits 17   Date for PT Re-Evaluation 09/14/16   Authorization Type 3   Authorization Time Period of 10 g - code   PT Start Time 1435   PT Stop Time 1518   PT Time Calculation (min) 43 min   Activity Tolerance Patient tolerated treatment well   Behavior During Therapy [facility] for tasks assessed/performed      Past Medical History:  Diagnosis Date  . Acid reflux   . Arthritis   . Back pain   . Gastric polyp 2014  . Hypertension   . Stomach ulcer   . Stroke [facility])     Past Surgical History:  Procedure Laterality Date  . BACK SURGERY  2011   vertebroplasty   . CARDIAC CATHETERIZATION  2007   normal, [employer] [facility] [place]   . CARPAL TUNNEL RELEASE  2007  . cataract surgery   2013  . COLON SURGERY  2015   lap sigmoid resection  . COLONOSCOPY  2011, 2015   Dr. Nicolasa Ducking, Dr Melody Comas  . LAMINECTOMY  2011   decompression   . UPPER GI ENDOSCOPY  2011  . VASCULAR SURGERY Right 2010   right rental artery     There were no vitals filed for this visit.      Subjective Assessment - 08/31/16 1435    Subjective Back is fair. 3/10 currently.    Pertinent History Pt states falling on a concrete floor 2011. Back feels like it is hurting more and more. Dr. Rosanna Randy suggested physical therapy for her back to see if it will help. Had PT for her back before prior to falling for spinal stenosis. Pt states that PT did not help her back at that time. Also went to the Chiropractic for treatment (prior to her fall) which involved lumbar traction. Pt felt that the treatment helped  initially but overall no change. Had back surgery afterwards (June 2011, and October 2011; both surgeries were at [facility]) to her L4/L5 afterwards which helped her walk.    Has not had PT for her back since after the surgery.   Currently has difficulty standing due to pain. Difficulty doing dishes, cooking, walking, buying groceries.  Pt states having bladder leakage (Dr. Rosanna Randy aware). Has a hx of colon surgery in which the bladder had to be repaired. Had 2 strokes which affected her L UE and L LE.   Denies saddle anesthesia.    Patient Stated Goals "I would like to be able to walk better, stand longer, go out and do things, go shopping."   Currently in Pain? Yes   Pain Score 3    Pain Onset More than a month ago                                 PT Education - 08/31/16 1440    Education provided Yes   Education Details ther-ex   [employer]) Educated Patient   Methods Demonstration;Explanation;Verbal cues;Tactile cues   Comprehension Returned demonstration;Verbalized understanding  Objectives  There-ex  Seated (to decrease low back extension) bilateral scapular retraction resisting red band 10x then 10x5 seconds. Pain free range for L shoulder.  Standing hip abduction with bilateral UE assist 10x each LE for glute med strengthening.  Bilateral knee discomfort which eases with sitting rest break.  seated forward ball rollouts using physioball 10x5 seconds to promote trunk flexion and decrease low back pain.  Then to the R 10x5 seconds    standing alternating toe taps onto 1st regular step with bilateral UE assist 10 x 2 each LE.  Standing glute squeeze with scapular retraction 10x5 seconds for 2 sets.     Improved exercise technique, movement at target joints, use of target muscles after min to mod verbal, visual, tactile cues.     Manual therapy   STM to L lumbar paraspinal muscles secondary to TTP to that area and muscle tension  palpated.   Slight decrease in back discomfort afterwards           PT Long Term Goals - 08/24/16 1546      PT LONG TERM GOAL #1   Title Patient will have a decrease in low back pain to 5/10 or less at worst to promote ability to stand and perform tasks such as cooking and cleaning.    Baseline 11/10 back pain at worst; 7/10 at most for the past 7 days, has not had a busy week (3/10 on average; 08/24/16)   Time 8   Period Weeks   Status On-going     PT LONG TERM GOAL #2   Title Patient will improve L hip extension ROM by at least 8 degrees to promote ability to perform standing tasks.    Baseline L hip extension -20 degrees; -13 degrees (no back pain with measurement; 08/22/16)   Time 8   Period Weeks   Status On-going     PT LONG TERM GOAL #3   Title Patient will improve bilateral glute med strength by at least 1/2 MMT grade to promote ability to ambulate and perform standing tasks with less back pain.    Baseline 4/5 bilateral hip abduction strength; R 4/5, L 4+/5 (08/22/16)   Time 8   Period Weeks   Status Partially Met     PT LONG TERM GOAL #4   Title Patient will improve her Modified Oswestry Low Back Pain Disability Questionnaire score by at least 12% as a demonstration of improved function.    Baseline 58%; 42% (08/24/16)   Time 8   Period Weeks   Status Achieved     PT LONG TERM GOAL #5   Title Patient will improve her Modified Oswestry Low Back Pain Disability Questionnaire score to 34% or less as a demonstration of improved function.    Baseline 42% (08/24/16)   Time 4   Period Weeks   Status New               Plan - 08/31/16 1440    Clinical Impression Statement Decreased low back pain with STM to L lumbar paraspinal muscles. Continues to average 3/10 to low back pain at start of session which is less compared to first few sessions. Good carry over of decreased symptoms from one session to the next.  Decreased back pain to 2/10 with fatigue  at end of session.    Rehab Potential Fair   Clinical Impairments Affecting Rehab Potential Chronicity of condition, pain, hx of CVA which affected L LE strength (per pt reports)  PT Frequency 2x / week   PT Duration 8 weeks   PT Treatment/Interventions Manual techniques;Therapeutic exercise;Therapeutic activities;Dry needling;Neuromuscular re-education;Patient/family education;Electrical Stimulation;Aquatic Therapy   PT Next Visit Plan hip extension, hip strengthening, thoracic extension, function, modalities PRN   Consulted and Agree with Plan of Care Patient      Patient will benefit from skilled therapeutic intervention in order to improve the following deficits and impairments:  Pain, Improper body mechanics, Postural dysfunction, Decreased strength, Difficulty walking, Decreased range of motion  Visit Diagnosis: Chronic bilateral low back pain without sciatica  Difficulty in walking, not elsewhere classified  Muscle weakness (generalized)     Problem List Patient Active Problem List   Diagnosis Date Noted  . Carotid artery narrowing 08/09/2015  . Syncope and collapse 07/30/2015  . Allergic rhinitis 01/05/2015  . Cerebral infarct ([place]) 01/05/2015  . Accelerated essential hypertension 01/05/2015  . Benign gastric polyp 01/05/2015  . Hypercholesteremia 01/05/2015  . Neuropathy ([place]) 01/05/2015  . Obesity 01/05/2015  . Arthritis of knee, degenerative 01/05/2015  . Borderline diabetes 01/05/2015  . Temporary cerebral vascular dysfunction 01/05/2015  . GERD (gastroesophageal reflux disease) 03/31/2013  . Gastric polyp    Joneen Boers PT, DPT   08/31/2016, 3:38 PM  [facility] [address] [place], [address] Phone: [phone]   Fax:  [phone]  Name: Dawn [name] MRN: [id number] Date of Birth: 08/11/1934

## 2016-09-05 ENCOUNTER — Ambulatory Visit (INDEPENDENT_AMBULATORY_CARE_PROVIDER_SITE_OTHER): Payer: Medicare Other | Admitting: Family Medicine

## 2016-09-05 ENCOUNTER — Ambulatory Visit
Admission: RE | Admit: 2016-09-05 | Discharge: 2016-09-05 | Disposition: A | Discharge status: Home / Self Care | Payer: Medicare Other | Source: Ambulatory Visit | Attending: Family Medicine | Admitting: Family Medicine

## 2016-09-05 ENCOUNTER — Ambulatory Visit: Payer: Medicare Other

## 2016-09-05 ENCOUNTER — Telehealth: Payer: Self-pay | Admitting: Family Medicine

## 2016-09-05 ENCOUNTER — Encounter: Payer: Self-pay | Admitting: Family Medicine

## 2016-09-05 VITALS — BP 148/70 | HR 74 | Temp 97.9°F | Resp 16 | Wt 206.0 lb

## 2016-09-05 DIAGNOSIS — R0781 Pleurodynia: Secondary | ICD-10-CM | POA: Diagnosis not present

## 2016-09-05 DIAGNOSIS — M8580 Other specified disorders of bone density and structure, unspecified site: Secondary | ICD-10-CM | POA: Insufficient documentation

## 2016-09-05 DIAGNOSIS — R1011 Right upper quadrant pain: Secondary | ICD-10-CM | POA: Insufficient documentation

## 2016-09-05 DIAGNOSIS — R079 Chest pain, unspecified: Secondary | ICD-10-CM | POA: Diagnosis not present

## 2016-09-05 MED ORDER — LEVOFLOXACIN 500 MG PO TABS: 500.0000 mg | ORAL_TABLET | Freq: Every day | ORAL | 0 refills | Status: AC

## 2016-09-05 NOTE — Progress Notes (Signed)
Patient: Dawn [name] Female    DOB: 01/27/1934   82 y.o.   MRN: [id number] Visit Date: 09/05/2016  Today's Provider: Lelon Huh, MD   Chief Complaint  Patient presents with  . Abdominal Pain   Subjective:    Abdominal Pain  This is a new problem. The current episode started in the past 7 days (Started Friday night). The onset quality is sudden. The problem occurs constantly. The problem has been gradually worsening. The pain is located in the RUQ. The quality of the pain is aching, sharp and burning (back is aching and burning; RUQ in painful). The abdominal pain radiates to the back and epigastric region. Associated symptoms include constipation (She had constipation last week and " I still have some"). Pertinent negatives include no belching, diarrhea, fever, nausea or vomiting. The pain is aggravated by certain positions, movement and deep breathing. The pain is relieved by being still. She has tried acetaminophen and antacids (Omeprazole and Zantac) for the symptoms. The treatment provided no relief.   Pain is constant, has not had much appetite. Pain is not affected by eating. No nausea or vomiting. Pain is worse when taking a deep breath and when coughing. No recent falls. No fever.     Allergies  Allergen Reactions  . Quinine Anaphylaxis and Swelling  . Statins     She has tried several and has had issues with them like joint pain  . Celecoxib Rash  . Codeine Rash     Current Outpatient Prescriptions:  .  acetaminophen (TYLENOL) 325 MG tablet, Take 650 mg by mouth every 6 (six) hours as needed for mild pain., Disp: , Rfl:  .  aspirin EC 81 MG tablet, Take 81 mg by mouth daily., Disp: , Rfl:  .  Biotin 5000 MCG CAPS, Take 5,000 mcg by mouth daily., Disp: , Rfl:  .  Calcium Carbonate-Vitamin D (CALCIUM 600+D) 600-200 MG-UNIT TABS, Take 1 tablet by mouth daily., Disp: , Rfl:  .  co-enzyme Q-10 30 MG capsule, Take 30 mg by mouth 3 (three) times daily., Disp: ,  Rfl:  .  fexofenadine (ALLEGRA) 180 MG tablet, Take 180 mg by mouth daily., Disp: , Rfl:  .  labetalol (NORMODYNE) 200 MG tablet, Take 1 tablet (200 mg total) by mouth 3 (three) times daily., Disp: 270 tablet, Rfl: 3 .  Magnesium 250 MG TABS, Take by mouth 1 day or 1 dose., Disp: , Rfl:  .  Olmesartan-Amlodipine-HCTZ 40-10-25 MG TABS, TAKE 1 TABLET BY MOUTH EVERY DAY, Disp: 30 tablet, Rfl: 12 .  omeprazole (PRILOSEC) 20 MG capsule, Take 1 capsule (20 mg total) by mouth daily., Disp: 90 capsule, Rfl: 3 .  ranitidine (ZANTAC) 150 MG tablet, Take 150 mg by mouth at bedtime as needed for heartburn., Disp: , Rfl:  .  simvastatin (ZOCOR) 20 MG tablet, Take 1 tablet (20 mg total) by mouth daily. Reported on 11/02/2015 (Patient taking differently: Take 10 mg by mouth daily. Reported on 11/02/2015), Disp: 30 tablet, Rfl: 12 .  SODIUM CHLORIDE, EXTERNAL, (SALINE WOUND [place]) 0.9 % SOLN, Cleanse forearm wound daily and apply dressing and bandage, Disp: 210 mL, Rfl: 0 .  traMADol (ULTRAM) 50 MG tablet, Take 1 tablet (50 mg total) by mouth every 4 (four) hours as needed., Disp: 100 tablet, Rfl: 5  Review of Systems  Constitutional: Positive for chills. Negative for fever.  Cardiovascular: Negative for chest pain, palpitations and leg swelling.  Gastrointestinal: Positive for abdominal pain  and constipation (She had constipation last week and " I still have some"). Negative for diarrhea, nausea and vomiting.   Past Surgical History:  Procedure Laterality Date  . BACK SURGERY  2011   vertebroplasty   . CARDIAC CATHETERIZATION  2007   normal, [employer] [facility] [place]   . CARPAL TUNNEL RELEASE  2007  . cataract surgery   2013  . COLON SURGERY  2015   lap sigmoid resection  . COLONOSCOPY  2011, 2015   Dr. Nicolasa Ducking, Dr Melody Comas  . LAMINECTOMY  2011   decompression   . UPPER GI ENDOSCOPY  2011  . VASCULAR SURGERY Right 2010   right rental artery      Social History  Substance Use Topics  . Smoking  status: Never Smoker  . Smokeless tobacco: Never Used  . Alcohol use No   Objective:   BP (!) 148/70 (BP Location: Left Arm, Patient Position: Sitting, Cuff Size: Normal)   Pulse 74   Temp 97.9 F (36.6 C) (Oral)   Resp 16   Wt 206 lb (93.4 kg)   SpO2 97%   BMI 38.92 kg/m   Physical Exam   General Appearance:    Alert, cooperative, no distress  Eyes:    PERRL, conjunctiva/corneas clear, EOM's intact       Lungs:     Clear to auscultation bilaterally, respirations unlabored  Heart:    Regular rate and rhythm  Neurologic:   Awake, alert, oriented x 3. No apparent focal neurological           defect.   Abd:   Tender over right anterior, lateral and posterior 9th and 10th ribs. Slight RUQ tenderness. No CVAT.        Assessment & Plan:     1. RUQ pain Cover for possible pneumonia while awaiting XR resu.ts - levofloxacin (LEVAQUIN) 500 MG tablet; Take 1 tablet (500 mg total) by mouth daily.  Dispense: 10 tablet; Refill: 0 - DG Abd 1 View; Future  2. Rib pain  - DG Chest 2 View; Future - DG Ribs Unilateral Right; Future - DG Abd 1 View; Future       Lelon Huh, MD  [facility]

## 2016-09-06 ENCOUNTER — Ambulatory Visit
Admission: RE | Admit: 2016-09-06 | Discharge: 2016-09-06 | Disposition: A | Discharge status: Home / Self Care | Payer: Medicare Other | Source: Ambulatory Visit | Attending: Family Medicine | Admitting: Family Medicine

## 2016-09-06 DIAGNOSIS — R1011 Right upper quadrant pain: Secondary | ICD-10-CM | POA: Diagnosis not present

## 2016-09-06 DIAGNOSIS — R932 Abnormal findings on diagnostic imaging of liver and biliary tract: Secondary | ICD-10-CM | POA: Insufficient documentation

## 2016-09-06 NOTE — Telephone Encounter (Signed)
Patient advised as below. Patient reports she has been up with pain since 4:30 am. Patient reports pain is about the same. Patient agreed to u/s. Please advise. Thank you. sd

## 2016-09-06 NOTE — Telephone Encounter (Signed)
-----   Message from Birdie Sons, MD sent at 09/06/2016  7:49 AM EST ----- Xrays of ribs, lungs and kidneys are all normal. If pain is not better today then need to get right upper quadrant ultrasound.

## 2016-09-06 NOTE — Telephone Encounter (Signed)
Please schedule RUQ ultrasound ASAP

## 2016-09-08 NOTE — Telephone Encounter (Signed)
Patient advised of [place] results but message did not get to a nurse box for some reason. Patient states does she need to continue taking Levaquin? Pain has improved very little.-aa

## 2016-09-20 ENCOUNTER — Encounter: Payer: Self-pay | Admitting: Family Medicine

## 2016-09-20 ENCOUNTER — Ambulatory Visit (INDEPENDENT_AMBULATORY_CARE_PROVIDER_SITE_OTHER): Payer: Medicare Other | Admitting: Family Medicine

## 2016-09-20 VITALS — BP 150/62 | HR 70 | Temp 98.1°F | Resp 16 | Wt 205.0 lb

## 2016-09-20 DIAGNOSIS — I10 Essential (primary) hypertension: Secondary | ICD-10-CM

## 2016-09-20 DIAGNOSIS — R7303 Prediabetes: Secondary | ICD-10-CM

## 2016-09-20 DIAGNOSIS — E7800 Pure hypercholesterolemia, unspecified: Secondary | ICD-10-CM | POA: Diagnosis not present

## 2016-09-20 DIAGNOSIS — R1011 Right upper quadrant pain: Secondary | ICD-10-CM | POA: Diagnosis not present

## 2016-09-20 LAB — POCT URINALYSIS DIPSTICK
Bilirubin, UA: NEGATIVE
GLUCOSE UA: NEGATIVE
Ketones, UA: NEGATIVE
Leukocytes, UA: NEGATIVE
NITRITE UA: NEGATIVE
Protein, UA: NEGATIVE
RBC UA: NEGATIVE
Spec Grav, UA: 1.01
UROBILINOGEN UA: 0.2
pH, UA: 6

## 2016-09-20 NOTE — Progress Notes (Signed)
Subjective:  HPI  Lipid/Cholesterol, Follow-up:   Last seen for this3 months ago.  Management changes since that visit include none. . Last Lipid Panel:    Component Value Date/Time   CHOL 158 12/20/2015 1025   CHOL 167 06/08/2013 0516   TRIG 118 12/20/2015 1025   TRIG 178 06/08/2013 0516   HDL 57 12/20/2015 1025   HDL 41 06/08/2013 0516   CHOLHDL 4.9 07/31/2015 0404   VLDL 35 07/31/2015 0404   VLDL 36 06/08/2013 0516   LDLCALC 77 12/20/2015 1025   LDLCALC 90 06/08/2013 0516    Risk factors for vascular disease include hypercholesterolemia and hypertension  She reports good compliance with treatment. She is not having side effects.  Current symptoms include none and have been unchanged. Weight trend: stable Current exercise: none  Wt Readings from Last 3 Encounters:  09/20/16 205 lb (93 kg)  09/05/16 206 lb (93.4 kg)  06/15/16 208 lb (94.3 kg)    ------------------------------------------------------------------- Pt reports that she is still having right sided abdominal pain that she saw Dr. Caryn Section for. She reports that it is better but still there. Ultra sound and X-rays were ordered and did not show the cause of the pain. Pt is also due for routine labs.   Prior to Admission medications   Medication Sig Start Date End Date Taking? Authorizing Provider  acetaminophen (TYLENOL) 325 MG tablet Take 650 mg by mouth every 6 (six) hours as needed for mild pain.    Historical Provider, MD  aspirin EC 81 MG tablet Take 81 mg by mouth daily.    Historical Provider, MD  Biotin 5000 MCG CAPS Take 5,000 mcg by mouth daily.    Historical Provider, MD  Calcium Carbonate-Vitamin D (CALCIUM 600+D) 600-200 MG-UNIT TABS Take 1 tablet by mouth daily.    Historical Provider, MD  co-enzyme Q-10 30 MG capsule Take 30 mg by mouth 3 (three) times daily.    Historical Provider, MD  fexofenadine (ALLEGRA) 180 MG tablet Take 180 mg by mouth daily.    Historical Provider, MD  labetalol  (NORMODYNE) 200 MG tablet Take 1 tablet (200 mg total) by mouth 3 (three) times daily. 02/16/16   Nayelli Inglis Maceo Pro., MD  Magnesium 250 MG TABS Take by mouth 1 day or 1 dose.    Historical Provider, MD  Olmesartan-Amlodipine-HCTZ 40-10-25 MG TABS TAKE 1 TABLET BY MOUTH EVERY DAY 12/28/15   Jerrol Banana., MD  omeprazole (PRILOSEC) 20 MG capsule Take 1 capsule (20 mg total) by mouth daily. 02/16/16   Blondie Riggsbee Maceo Pro., MD  ranitidine (ZANTAC) 150 MG tablet Take 150 mg by mouth at bedtime as needed for heartburn.    Historical Provider, MD  simvastatin (ZOCOR) 20 MG tablet Take 1 tablet (20 mg total) by mouth daily. Reported on 11/02/2015 Patient taking differently: Take 10 mg by mouth daily. Reported on 11/02/2015 02/16/16   Jerrol Banana., MD  SODIUM CHLORIDE, EXTERNAL, (SALINE WOUND [place]) 0.9 % SOLN Cleanse forearm wound daily and apply dressing and bandage 04/25/16   Carmon Ginsberg, PA  traMADol (ULTRAM) 50 MG tablet Take 1 tablet (50 mg total) by mouth every 4 (four) hours as needed. 08/24/15   Mikea Quadros Maceo Pro., MD    Patient Active Problem List   Diagnosis Date Noted  . Carotid artery narrowing 08/09/2015  . Syncope and collapse 07/30/2015  . Allergic rhinitis 01/05/2015  . Cerebral infarct ([place]) 01/05/2015  . Accelerated essential hypertension 01/05/2015  .  Benign gastric polyp 01/05/2015  . Hypercholesteremia 01/05/2015  . Neuropathy ([place]) 01/05/2015  . Obesity 01/05/2015  . Arthritis of knee, degenerative 01/05/2015  . Borderline diabetes 01/05/2015  . Temporary cerebral vascular dysfunction 01/05/2015  . GERD (gastroesophageal reflux disease) 03/31/2013  . Gastric polyp     Past Medical History:  Diagnosis Date  . Acid reflux   . Arthritis   . Back pain   . Gastric polyp 2014  . Hypertension   . Stomach ulcer   . Stroke [facility])     Social History   Social History  . Marital status: Divorced    Spouse name: none  . Number of children: 1  . Years of  education: 72   Occupational History  . retired    Social History Main Topics  . Smoking status: Never Smoker  . Smokeless tobacco: Never Used  . Alcohol use No  . Drug use: No  . Sexual activity: No   Other Topics Concern  . Not on file   Social History Narrative  . No narrative on file    Allergies  Allergen Reactions  . Quinine Anaphylaxis and Swelling  . Statins     She has tried several and has had issues with them like joint pain  . Celecoxib Rash  . Codeine Rash    Review of Systems  Constitutional: Positive for malaise/fatigue.  HENT: Negative.   Eyes: Negative.   Respiratory: Negative.   Cardiovascular: Negative.   Gastrointestinal: Positive for abdominal pain.  Genitourinary: Negative.   Musculoskeletal: Positive for joint pain.  Skin: Negative.   Neurological: Negative.   Endo/Heme/Allergies: Negative.   Psychiatric/Behavioral: Negative.     Immunization History  Administered Date(s) Administered  . Influenza, High Dose Seasonal PF 06/16/2015, 06/29/2016  . Pneumococcal Conjugate-13 02/17/2015  . Pneumococcal Polysaccharide-23 08/14/2012    Objective:  BP (!) 150/62 (BP Location: Left Arm, Patient Position: Sitting, Cuff Size: Normal)   Pulse 70   Temp 98.1 F (36.7 C) (Oral)   Resp 16   Wt 205 lb (93 kg)   BMI 38.73 kg/m   Physical Exam  Constitutional: She is oriented to person, place, and time and well-developed, well-nourished, and in no distress.  HENT:  Head: Normocephalic and atraumatic.  Right Ear: External ear normal.  Left Ear: External ear normal.  Nose: Nose normal.  Eyes: Conjunctivae and EOM are normal. Pupils are equal, round, and reactive to light.  Neck: Neck supple. No thyromegaly present.  Cardiovascular: Normal rate, regular rhythm, normal heart sounds and intact distal pulses.   Pulmonary/Chest: Effort normal and breath sounds normal.  Abdominal: Soft. Bowel sounds are normal. There is tenderness (over right lateral  ribs).  Musculoskeletal: Normal range of motion. She exhibits no edema.  Neurological: She is alert and oriented to person, place, and time. She has normal reflexes. Gait normal. GCS score is 15.  Skin: Skin is warm and dry.  Psychiatric: Mood, memory, affect and judgment normal.    Lab Results  Component Value Date   WBC 7.0 07/31/2015   HGB 12.8 07/31/2015   HCT 38.0 07/31/2015   PLT 213 07/31/2015   GLUCOSE 95 08/25/2015   CHOL 158 12/20/2015   TRIG 118 12/20/2015   HDL 57 12/20/2015   LDLCALC 77 12/20/2015   TSH 2.08 12/16/2014   INR 0.95 07/30/2015   HGBA1C 5.6 06/16/2015    CMP     Component Value Date/Time   NA 134 08/25/2015 1039   NA 134 (L)  12/20/2013 0910   K 4.4 08/25/2015 1039   K 3.4 (L) 12/24/2013 0435   CL 93 (L) 08/25/2015 1039   CL 102 12/20/2013 0910   CO2 23 08/25/2015 1039   CO2 26 12/20/2013 0910   GLUCOSE 95 08/25/2015 1039   GLUCOSE 103 (H) 07/31/2015 0404   GLUCOSE 119 (H) 12/20/2013 0910   BUN 20 08/25/2015 1039   BUN 12 12/20/2013 0910   CREATININE 1.36 (H) 08/25/2015 1039   CREATININE 0.91 12/24/2013 0435   CALCIUM 9.8 08/25/2015 1039   CALCIUM 7.9 (L) 12/20/2013 0910   PROT 6.6 12/20/2015 1025   PROT 7.1 10/13/2013 2057   ALBUMIN 4.1 12/20/2015 1025   ALBUMIN 3.3 (L) 10/13/2013 2057   AST 18 12/20/2015 1025   AST 25 10/13/2013 2057   ALT 19 12/20/2015 1025   ALT 16 10/13/2013 2057   ALKPHOS 91 12/20/2015 1025   ALKPHOS 96 10/13/2013 2057   BILITOT 0.4 12/20/2015 1025   BILITOT 0.5 10/13/2013 2057   GFRNONAA 37 (L) 08/25/2015 1039   GFRNONAA >60 12/24/2013 0435   GFRAA 43 (L) 08/25/2015 1039   GFRAA >60 12/24/2013 0435    Assessment and Plan :  1. Essential hypertension  - TSH - POCT urinalysis dipstick  2. Hypercholesteremia  - Lipid Panel With LDL/HDL Ratio - Comprehensive metabolic panel  3. Right upper quadrant pain Most likely muscular. May need chest/abdominal CT if this persists. - CBC with  Differential/Platelet - Comprehensive metabolic panel  4. Borderline diabetes  - Hemoglobin A1c  HPI, Exam, and A&P Transcribed under the direction and in the presence of [place] Steidle L. Cranford Mon, MD  Electronically Signed: Webb Laws, CMA I have done the exam and reviewed the above chart and it is accurate to the best of my knowledge. Development worker, community has been used in this note in any air is in the dictation or transcription are unintentional.  [facility] 09/20/2016 2:05 PM

## 2016-09-21 DIAGNOSIS — R1011 Right upper quadrant pain: Secondary | ICD-10-CM | POA: Diagnosis not present

## 2016-09-21 DIAGNOSIS — E7800 Pure hypercholesterolemia, unspecified: Secondary | ICD-10-CM | POA: Diagnosis not present

## 2016-09-21 DIAGNOSIS — R7303 Prediabetes: Secondary | ICD-10-CM | POA: Diagnosis not present

## 2016-09-21 DIAGNOSIS — I10 Essential (primary) hypertension: Secondary | ICD-10-CM | POA: Diagnosis not present

## 2016-09-22 LAB — COMPREHENSIVE METABOLIC PANEL
ALT: 14 IU/L (ref 0–32)
AST: 17 IU/L (ref 0–40)
Albumin/Globulin Ratio: 1.9 (ref 1.2–2.2)
Albumin: 4.1 g/dL (ref 3.5–4.7)
Alkaline Phosphatase: 86 IU/L (ref 39–117)
BILIRUBIN TOTAL: 0.4 mg/dL (ref 0.0–1.2)
BUN / CREAT RATIO: 13 (ref 12–28)
BUN: 18 mg/dL (ref 8–27)
CHLORIDE: 97 mmol/L (ref 96–106)
CO2: 25 mmol/L (ref 18–29)
Calcium: 9.4 mg/dL (ref 8.7–10.3)
Creatinine, Ser: 1.34 mg/dL — ABNORMAL HIGH (ref 0.57–1.00)
GFR calc non Af Amer: 37 mL/min/{1.73_m2} — ABNORMAL LOW (ref >59)
GFR, EST AFRICAN AMERICAN: 43 mL/min/{1.73_m2} — AB (ref >59)
GLUCOSE: 97 mg/dL (ref 65–99)
Globulin, Total: 2.2 g/dL (ref 1.5–4.5)
Potassium: 3.9 mmol/L (ref 3.5–5.2)
Sodium: 141 mmol/L (ref 134–144)
TOTAL PROTEIN: 6.3 g/dL (ref 6.0–8.5)

## 2016-09-22 LAB — LIPID PANEL WITH LDL/HDL RATIO
Cholesterol, Total: 158 mg/dL (ref 100–199)
HDL: 59 mg/dL (ref >39)
LDL Calculated: 77 mg/dL (ref 0–99)
LDL/HDL RATIO: 1.3 ratio (ref 0.0–3.2)
TRIGLYCERIDES: 109 mg/dL (ref 0–149)
VLDL Cholesterol Cal: 22 mg/dL (ref 5–40)

## 2016-09-22 LAB — CBC WITH DIFFERENTIAL/PLATELET
BASOS: 2 %
Basophils Absolute: 0.1 10*3/uL (ref 0.0–0.2)
EOS (ABSOLUTE): 0.3 10*3/uL (ref 0.0–0.4)
EOS: 4 %
HEMATOCRIT: 35.5 % (ref 34.0–46.6)
HEMOGLOBIN: 11.8 g/dL (ref 11.1–15.9)
Immature Grans (Abs): 0 10*3/uL (ref 0.0–0.1)
Immature Granulocytes: 1 %
LYMPHS ABS: 1.7 10*3/uL (ref 0.7–3.1)
Lymphs: 27 %
MCH: 29.6 pg (ref 26.6–33.0)
MCHC: 33.2 g/dL (ref 31.5–35.7)
MCV: 89 fL (ref 79–97)
MONOCYTES: 7 %
Monocytes Absolute: 0.4 10*3/uL (ref 0.1–0.9)
NEUTROS ABS: 3.7 10*3/uL (ref 1.4–7.0)
Neutrophils: 59 %
Platelets: 269 10*3/uL (ref 150–379)
RBC: 3.99 x10E6/uL (ref 3.77–5.28)
RDW: 13.5 % (ref 12.3–15.4)
WBC: 6.2 10*3/uL (ref 3.4–10.8)

## 2016-09-22 LAB — TSH: TSH: 2.77 u[IU]/mL (ref 0.450–4.500)

## 2016-09-22 LAB — HEMOGLOBIN A1C
Est. average glucose Bld gHb Est-mCnc: 108 mg/dL
Hgb A1c MFr Bld: 5.4 % (ref 4.8–5.6)

## 2016-10-03 DIAGNOSIS — M1711 Unilateral primary osteoarthritis, right knee: Secondary | ICD-10-CM | POA: Diagnosis not present

## 2016-10-03 DIAGNOSIS — M170 Bilateral primary osteoarthritis of knee: Secondary | ICD-10-CM | POA: Diagnosis not present

## 2016-10-16 DIAGNOSIS — H43812 Vitreous degeneration, left eye: Secondary | ICD-10-CM | POA: Diagnosis not present

## 2016-11-16 DIAGNOSIS — H43812 Vitreous degeneration, left eye: Secondary | ICD-10-CM | POA: Diagnosis not present

## 2016-12-25 DIAGNOSIS — I6523 Occlusion and stenosis of bilateral carotid arteries: Secondary | ICD-10-CM | POA: Diagnosis not present

## 2016-12-25 DIAGNOSIS — I10 Essential (primary) hypertension: Secondary | ICD-10-CM | POA: Diagnosis not present

## 2016-12-25 DIAGNOSIS — R0602 Shortness of breath: Secondary | ICD-10-CM | POA: Diagnosis not present

## 2016-12-28 ENCOUNTER — Other Ambulatory Visit: Payer: Self-pay | Admitting: Family Medicine

## 2017-02-13 ENCOUNTER — Emergency Department
Admission: EM | Admit: 2017-02-13 | Discharge: 2017-02-13 | Disposition: A | Discharge status: Home / Self Care | Payer: Medicare Other | Attending: Emergency Medicine | Admitting: Emergency Medicine

## 2017-02-13 ENCOUNTER — Encounter: Payer: Self-pay | Admitting: Emergency Medicine

## 2017-02-13 ENCOUNTER — Ambulatory Visit (INDEPENDENT_AMBULATORY_CARE_PROVIDER_SITE_OTHER): Payer: Medicare Other | Admitting: Physician Assistant

## 2017-02-13 ENCOUNTER — Encounter: Payer: Self-pay | Admitting: Physician Assistant

## 2017-02-13 VITALS — BP 132/54 | HR 76 | Temp 97.3°F | Resp 16 | Wt 197.0 lb

## 2017-02-13 DIAGNOSIS — Z7982 Long term (current) use of aspirin: Secondary | ICD-10-CM | POA: Insufficient documentation

## 2017-02-13 DIAGNOSIS — I639 Cerebral infarction, unspecified: Secondary | ICD-10-CM | POA: Diagnosis not present

## 2017-02-13 DIAGNOSIS — R1110 Vomiting, unspecified: Secondary | ICD-10-CM | POA: Diagnosis not present

## 2017-02-13 DIAGNOSIS — Z79899 Other long term (current) drug therapy: Secondary | ICD-10-CM | POA: Insufficient documentation

## 2017-02-13 DIAGNOSIS — R531 Weakness: Secondary | ICD-10-CM | POA: Diagnosis not present

## 2017-02-13 DIAGNOSIS — I10 Essential (primary) hypertension: Secondary | ICD-10-CM | POA: Diagnosis not present

## 2017-02-13 DIAGNOSIS — R197 Diarrhea, unspecified: Secondary | ICD-10-CM | POA: Diagnosis not present

## 2017-02-13 DIAGNOSIS — E860 Dehydration: Secondary | ICD-10-CM | POA: Diagnosis not present

## 2017-02-13 DIAGNOSIS — R109 Unspecified abdominal pain: Secondary | ICD-10-CM | POA: Diagnosis not present

## 2017-02-13 LAB — CBC
HCT: 37.9 % (ref 35.0–47.0)
Hemoglobin: 13.5 g/dL (ref 12.0–16.0)
MCH: 30.9 pg (ref 26.0–34.0)
MCHC: 35.7 g/dL (ref 32.0–36.0)
MCV: 86.5 fL (ref 80.0–100.0)
PLATELETS: 232 10*3/uL (ref 150–440)
RBC: 4.39 MIL/uL (ref 3.80–5.20)
RDW: 13.2 % (ref 11.5–14.5)
WBC: 6.6 10*3/uL (ref 3.6–11.0)

## 2017-02-13 LAB — COMPREHENSIVE METABOLIC PANEL
ALT: 23 U/L (ref 14–54)
AST: 28 U/L (ref 15–41)
Albumin: 3.8 g/dL (ref 3.5–5.0)
Alkaline Phosphatase: 66 U/L (ref 38–126)
Anion gap: 14 (ref 5–15)
BILIRUBIN TOTAL: 1 mg/dL (ref 0.3–1.2)
BUN: 29 mg/dL — AB (ref 6–20)
CO2: 25 mmol/L (ref 22–32)
Calcium: 9.5 mg/dL (ref 8.9–10.3)
Chloride: 93 mmol/L — ABNORMAL LOW (ref 101–111)
Creatinine, Ser: 1.53 mg/dL — ABNORMAL HIGH (ref 0.44–1.00)
GFR, EST AFRICAN AMERICAN: 36 mL/min — AB (ref >60)
GFR, EST NON AFRICAN AMERICAN: 31 mL/min — AB (ref >60)
Glucose, Bld: 100 mg/dL — ABNORMAL HIGH (ref 65–99)
POTASSIUM: 2.7 mmol/L — AB (ref 3.5–5.1)
Sodium: 132 mmol/L — ABNORMAL LOW (ref 135–145)
TOTAL PROTEIN: 7.1 g/dL (ref 6.5–8.1)

## 2017-02-13 LAB — URINALYSIS, COMPLETE (UACMP) WITH MICROSCOPIC
Bacteria, UA: NONE SEEN
Bilirubin Urine: NEGATIVE
GLUCOSE, UA: NEGATIVE mg/dL
HGB URINE DIPSTICK: NEGATIVE
Ketones, ur: 5 mg/dL — AB
NITRITE: NEGATIVE
Protein, ur: 100 mg/dL — AB
SPECIFIC GRAVITY, URINE: 1.019 (ref 1.005–1.030)
pH: 5 (ref 5.0–8.0)

## 2017-02-13 LAB — LIPASE, BLOOD: LIPASE: 34 U/L (ref 11–51)

## 2017-02-13 LAB — TROPONIN I: Troponin I: <0.03 ng/mL (ref <0.03)

## 2017-02-13 MED ORDER — SODIUM CHLORIDE 0.9 % IV SOLN
1000.0000 mL | Freq: Once | INTRAVENOUS | Status: AC
  Administered 2017-02-13: 1000 mL via INTRAVENOUS

## 2017-02-13 NOTE — Progress Notes (Signed)
Patient: Dawn [name] Female    DOB: [birth year]   83 y.o.   MRN: [id number] Visit Date: 02/13/2017  Today's Provider: Trinna Post, PA-C   Chief Complaint  Patient presents with  . Abdominal Pain   Subjective:      Dawn [name] is an 83 y/o woman with history of cerebral infarct, HTN on multiple BP medications, history of colonic diverticular abscess, history of abdominal pain presenting today for vomiting and diarrhea since Friday. On Friday, she vomited three times, and not very much. She has not vomited since then. She has been having profuse watery diarrhea since then, however. Last night, she had an episode of diarrhea nearly every hour. Her diarrhea is nonbloody. She denies recent hospitalizations or antibiotics. The last time she ate and drank was yesterday when she ate several goldfish and had some sips of water. She is hesitant to eat and drink because it makes her upper abdomen burn in a band like fashion. She does not have any focal abdominal pain. She is not urinating as much. She has not taken her blood pressure medications today. She denies focal weakness. Remaining pertinent below. She noticed a tick on her leg two weeks ago but it was just crawling, not attached. No rash or myalgias, no fevers. She normally drives herself but asked her brother and his wife to taker her today because she is feeling so weak.    Abdominal Pain  This is a new problem. The current episode started in the past 7 days (x 6 days). The problem occurs daily. The problem has been waxing and waning. The pain is located in the generalized abdominal region. The pain is at a severity of 3/10 (has been as high as 9/10). The pain is mild. The quality of the pain is cramping and burning. The abdominal pain does not radiate. Associated symptoms include anorexia, belching (last night only), diarrhea, headaches (Saturday and Sunday) and vomiting (Friday afternoon only). Pertinent negatives include no  arthralgias, constipation, dysuria, fever (pt has not monitored temperature, but she was experiencing chills and sweats on Saturday and Sunday), flatus, frequency, hematochezia, hematuria, melena, myalgias, nausea or weight loss. The pain is aggravated by eating. Relieved by: not eating, and pt reports she is drinking "very little". She has tried nothing for the symptoms.      Allergies  Allergen Reactions  . Quinine Anaphylaxis and Swelling  . Levofloxacin Other (See Comments)    Affects tendons  . Statins     She has tried several and has had issues with them like joint pain  . Celecoxib Rash  . Codeine Rash     Current Outpatient Prescriptions:  .  acetaminophen (TYLENOL) 325 MG tablet, Take 650 mg by mouth every 6 (six) hours as needed for mild pain., Disp: , Rfl:  .  aspirin EC 81 MG tablet, Take 81 mg by mouth daily., Disp: , Rfl:  .  Biotin 5000 MCG CAPS, Take 5,000 mcg by mouth daily., Disp: , Rfl:  .  Calcium Carbonate-Vitamin D (CALCIUM 600+D) 600-200 MG-UNIT TABS, Take 1 tablet by mouth daily., Disp: , Rfl:  .  fexofenadine (ALLEGRA) 180 MG tablet, Take 180 mg by mouth daily., Disp: , Rfl:  .  labetalol (NORMODYNE) 200 MG tablet, Take 1 tablet (200 mg total) by mouth 3 (three) times daily., Disp: 270 tablet, Rfl: 3 .  Magnesium 250 MG TABS, Take by mouth 1 day or 1 dose., Disp: , Rfl:  .  Olmesartan-Amlodipine-HCTZ 40-10-25 MG TABS, TAKE 1 TABLET BY MOUTH EVERY DAY, Disp: 90 tablet, Rfl: 3 .  omeprazole (PRILOSEC) 20 MG capsule, Take 1 capsule (20 mg total) by mouth daily., Disp: 90 capsule, Rfl: 3 .  simvastatin (ZOCOR) 20 MG tablet, Take 1 tablet (20 mg total) by mouth daily. Reported on 11/02/2015 (Patient taking differently: Take 10 mg by mouth daily. Reported on 11/02/2015), Disp: 30 tablet, Rfl: 12 .  co-enzyme Q-10 30 MG capsule, Take 30 mg by mouth 3 (three) times daily., Disp: , Rfl:  .  ranitidine (ZANTAC) 150 MG tablet, Take 150 mg by mouth at bedtime as needed for  heartburn., Disp: , Rfl:  .  traMADol (ULTRAM) 50 MG tablet, Take 1 tablet (50 mg total) by mouth every 4 (four) hours as needed. (Patient not taking: Reported on 02/13/2017), Disp: 100 tablet, Rfl: 5  Review of Systems  Constitutional: Positive for chills, diaphoresis and fatigue. Negative for fever (pt has not monitored temperature, but she was experiencing chills and sweats on Saturday and Sunday) and weight loss.  Gastrointestinal: Positive for abdominal pain, anorexia, diarrhea and vomiting (Friday afternoon only). Negative for blood in stool, constipation, flatus, hematochezia, melena and nausea.  Genitourinary: Positive for decreased urine volume. Negative for dysuria, frequency and hematuria.  Musculoskeletal: Negative for arthralgias and myalgias.  Neurological: Positive for weakness and headaches (Saturday and Sunday).    Social History  Substance Use Topics  . Smoking status: Never Smoker  . Smokeless tobacco: Never Used  . Alcohol use No   Objective:   BP (!) 132/54 (BP Location: Left Arm, Patient Position: Sitting, Cuff Size: Large)   Pulse 76   Temp 97.3 F (36.3 C) (Oral)   Resp 16   Wt 197 lb (89.4 kg)   BMI 37.22 kg/m  Vitals:   02/13/17 1046  BP: (!) 132/54  Pulse: 76  Resp: 16  Temp: 97.3 F (36.3 C)  TempSrc: Oral  Weight: 197 lb (89.4 kg)     Physical Exam  Constitutional: She is oriented to person, place, and time. She appears well-developed and well-nourished.  Looks pale.   HENT:  Mouth/Throat: Mucous membranes are dry.  Cardiovascular: Normal rate and regular rhythm.   Pulmonary/Chest: Effort normal and breath sounds normal.  Abdominal: Soft. Bowel sounds are normal. She exhibits no distension and no mass. There is no tenderness. There is no rebound and no guarding.  Neurological: She is alert and oriented to person, place, and time.  Skin: Skin is warm and dry.  Reduced turgor but no tenting.   Psychiatric: She has a normal mood and affect. Her  behavior is normal.           Assessment & Plan:     1. Vomiting and diarrhea  83 y/o woman w/ hx of stroke and HTN presenting today with above. BP low diastolic, but she is off her medications and has HTN on baseline, so inclined to think this is low for her. Appears weak in office, needs assistance walking. Exam relatively benign, nonetheless concerns for dehydration and AKI esp considering recent diuretic use, abdominal pathology including pancreatitis, cholecystitis, diverticulitis. This seems a departure from her baseline and family agrees. Have instructed her to [facility] ER for urgent evaluation and have contacted the charge nurse.   Have reviewed her labs and imaging to include recent CMP and CBC, as well as endoscopy report 2014 and visits with Dr. Jamal Collin regarding abdominal pain.   2. Weakness  See above.  I have spent 25 minutes with this patient, >50% of which was spent on counseling and coordination of care.   The entirety of the information documented in the History of Present Illness, Review of Systems and Physical Exam were personally obtained by me. Portions of this information were initially documented by Raquel Sarna D. and reviewed by me for thoroughness and accuracy.           Trinna Post, PA-C  [facility]

## 2017-02-13 NOTE — ED Triage Notes (Signed)
C/O dizziness x 1 week.  Abdominal pain, diarrhea, and some emesis on Friday.    Present today with complaint of continued dizziness, diarrhea, and abdominal pain.

## 2017-02-13 NOTE — ED Notes (Signed)
Patient reports diarrhea since Friday.  Patient denies pain at this time but reports abdominal pain earlier today.  Patient is in no obvious distress at this time.

## 2017-02-13 NOTE — Patient Instructions (Signed)

## 2017-02-13 NOTE — ED Provider Notes (Signed)
[facility] Emergency Department Provider Note   ____________________________________________    I have reviewed the triage vital signs and the nursing notes.   HISTORY  Chief Complaint Emesis; Abdominal Pain; and Diarrhea     HPI Dawn [name] is a 83 y.o. female presents with complaints of nausea vomiting and diarrhea. Patient reports 5 days ago she had nausea with several episodes of vomiting, that resolved and since then she has had diarrhea. She did have a brief episode of burning like pain in her upper abdomen bilaterally that resolved several days ago. Primarily her complaint is diarrhea. She reports multiple episodes of diarrhea, nonbloody, watery stool. No fevers or chills. No sick contacts reported. She reports she has felt dizzy when standing up. No recent travel   Past Medical History:  Diagnosis Date  . Acid reflux   . Arthritis   . Back pain   . Gastric polyp 2014  . Hypertension   . Stomach ulcer   . Stroke [facility])     Patient Active Problem List   Diagnosis Date Noted  . Carotid artery narrowing 08/09/2015  . Syncope and collapse 07/30/2015  . Allergic rhinitis 01/05/2015  . Cerebral infarct ([place]) 01/05/2015  . Accelerated essential hypertension 01/05/2015  . Benign gastric polyp 01/05/2015  . Hypercholesteremia 01/05/2015  . Neuropathy 01/05/2015  . Obesity 01/05/2015  . Arthritis of knee, degenerative 01/05/2015  . Borderline diabetes 01/05/2015  . Temporary cerebral vascular dysfunction 01/05/2015  . GERD (gastroesophageal reflux disease) 03/31/2013  . Gastric polyp     Past Surgical History:  Procedure Laterality Date  . BACK SURGERY  2011   vertebroplasty   . CARDIAC CATHETERIZATION  2007   normal, [employer] [facility] [place]   . CARPAL TUNNEL RELEASE  2007  . cataract surgery   2013  . COLON SURGERY  2015   lap sigmoid resection  . COLONOSCOPY  2011, 2015   Dr. Nicolasa Ducking, Dr Melody Comas  . LAMINECTOMY  2011     decompression   . UPPER GI ENDOSCOPY  2011  . VASCULAR SURGERY Right 2010   right rental artery     Prior to Admission medications   Medication Sig Start Date End Date Taking? Authorizing Provider  acetaminophen (TYLENOL) 325 MG tablet Take 650 mg by mouth every 6 (six) hours as needed for mild pain.   Yes [provider]  aspirin EC 81 MG tablet Take 81 mg by mouth daily.   Yes [provider]  Biotin 5000 MCG CAPS Take 5,000 mcg by mouth daily.   Yes [provider]  Calcium Carbonate-Vitamin D (CALCIUM 600+D) 600-200 MG-UNIT TABS Take 1 tablet by mouth daily.   Yes [provider]  fexofenadine (ALLEGRA) 180 MG tablet Take 180 mg by mouth daily.   Yes [provider]  labetalol (NORMODYNE) 200 MG tablet Take 1 tablet (200 mg total) by mouth 3 (three) times daily. 02/16/16  Yes Jerrol Banana., MD  Magnesium 250 MG TABS Take 1 tablet by mouth daily.    Yes [provider]  Olmesartan-Amlodipine-HCTZ 40-10-25 MG TABS TAKE 1 TABLET BY MOUTH EVERY DAY 12/28/16  Yes Jerrol Banana., MD  omeprazole (PRILOSEC) 20 MG capsule Take 1 capsule (20 mg total) by mouth daily. 02/16/16  Yes Jerrol Banana., MD  simvastatin (ZOCOR) 20 MG tablet Take 1 tablet (20 mg total) by mouth daily. Reported on 11/02/2015 Patient taking differently: Take 10 mg by mouth daily. Reported  on 11/02/2015 02/16/16  Yes Jerrol Banana., MD  ranitidine (ZANTAC) 150 MG tablet Take 150 mg by mouth at bedtime as needed for heartburn.    [provider]  traMADol (ULTRAM) 50 MG tablet Take 1 tablet (50 mg total) by mouth every 4 (four) hours as needed. Patient not taking: Reported on 02/13/2017 08/24/15   Jerrol Banana., MD     Allergies Quinine; Levofloxacin; Statins; Celecoxib; and Codeine  Family History  Problem Relation Age of Onset  . Cancer Father        stomach  . Emphysema Father   . Hypertension Mother   . Atrial  fibrillation Mother   . Diabetes Sister   . Hypertension Sister   . Skin cancer Sister   . Hypertension Brother   . Heart disease Brother   . Heart disease Sister   . Colon polyps Sister   . Hypertension Sister   . Thyroid disease Sister   . Irritable bowel syndrome Sister   . Heart disease Brother   . Colon polyps Brother   . Diabetes Brother   . Hypertension Brother   . Neuropathy Brother   . Post-traumatic stress disorder Brother   . Hypertension Brother   . Breast cancer Paternal Aunt   . Breast cancer Paternal Grandmother   . Breast cancer Paternal Aunt   . Breast cancer Paternal Aunt   . Breast cancer Paternal Aunt     Social History Social History  Substance Use Topics  . Smoking status: Never Smoker  . Smokeless tobacco: Never Used  . Alcohol use No    Review of Systems  Constitutional: No fever/chills Eyes: No visual changes.  ENT: No sore throat. Cardiovascular: Denies chest pain. Respiratory: Denies shortness of breath. Gastrointestinal:As above Genitourinary: Negative for dysuria. Musculoskeletal: Negative for back pain. Skin: Negative for rash. Neurological: Negative for headaches    ____________________________________________   PHYSICAL EXAM:  VITAL SIGNS: ED Triage Vitals  Enc Vitals Group     BP 02/13/17 1140 (!) 141/46     Pulse Rate 02/13/17 1140 65     Resp 02/13/17 1140 16     Temp 02/13/17 1140 98.7 F (37.1 C)     Temp Source 02/13/17 1140 Oral     SpO2 02/13/17 1140 98 %     Weight 02/13/17 1144 89.4 kg (197 lb)     Height 02/13/17 1144 1.549 m (5\' 1" )     Head Circumference --      Peak Flow --      Pain Score 02/13/17 1144 3     Pain Loc --      Pain Edu? --      Excl. in [place]? --     Constitutional: Alert and oriented. No acute distress. Pleasant and interactive Eyes: Conjunctivae are normal.   Nose: No congestion/rhinnorhea. Mouth/Throat: Mucous membranes are moist.    Cardiovascular: Normal rate, regular rhythm.  Grossly normal heart sounds.  Good peripheral circulation. Respiratory: Normal respiratory effort.  No retractions. Lungs CTAB. Gastrointestinal: Soft and nontender. No distention.  No CVA tenderness. Genitourinary: deferred Musculoskeletal: No lower extremity tenderness nor edema.  Warm and well perfused Neurologic:  Normal speech and language. No gross focal neurologic deficits are appreciated.  Skin:  Skin is warm, dry and intact. No rash noted. Psychiatric: Mood and affect are normal. Speech and behavior are normal.  ____________________________________________   LABS (all labs ordered are listed, but only abnormal results are displayed)  Labs Reviewed  COMPREHENSIVE METABOLIC PANEL - Abnormal; Notable for the following:       Result Value   Sodium 132 (*)    Potassium 2.7 (*)    Chloride 93 (*)    Glucose, Bld 100 (*)    BUN 29 (*)    Creatinine, Ser 1.53 (*)    GFR calc non Af Amer 31 (*)    GFR calc Af Amer 36 (*)    All other components within normal limits  URINALYSIS, COMPLETE (UACMP) WITH MICROSCOPIC - Abnormal; Notable for the following:    Color, Urine AMBER (*)    APPearance HAZY (*)    Ketones, ur 5 (*)    Protein, ur 100 (*)    Leukocytes, UA SMALL (*)    Squamous Epithelial / LPF 0-5 (*)    All other components within normal limits  LIPASE, BLOOD  CBC  TROPONIN I   ____________________________________________  EKG  ED ECG REPORT I, Lavonia Drafts, the attending physician, personally viewed and interpreted this ECG.  Date: 02/13/2017  Rate: 66 Rhythm: normal sinus rhythm QRS Axis: normal Intervals: normal ST/T Wave abnormalities: Nonspecific changes Conduction Disturbances: none   ____________________________________________  RADIOLOGY  None ____________________________________________   PROCEDURES  Procedure(s) performed: No    Critical Care performed: No ____________________________________________   INITIAL IMPRESSION /  ASSESSMENT AND PLAN / ED COURSE  Pertinent labs & imaging results that were available during my care of the patient were reviewed by me and considered in my medical decision making (see chart for details).  Patient presents with primarily diarrhea and some dizziness. Abdominal exam is unremarkable. Vitals are normal. Patient appears primarily dehydrated from diarrhea. We will give IV fluids, check orthostatics and reevaluate.   ----------------------------------------- 3:43 PM on 02/13/2017 -----------------------------------------  Patient feels markedly improved, she would like to go home and I think this is recent. She has family that will keep an eye on her. Family feels comfortable with her going home. Recommend increase hydration, Imodium or Pepto-Bismol for diarrhea. Return precautions discussed at length    ____________________________________________   FINAL CLINICAL IMPRESSION(S) / ED DIAGNOSES  Final diagnoses:  Diarrhea, unspecified type  Dehydration      NEW MEDICATIONS STARTED DURING THIS VISIT:  New Prescriptions   No medications on file     Note:  This document was prepared using Dragon voice recognition software and may include unintentional dictation errors.    Lavonia Drafts, MD 02/13/17 [phone]

## 2017-03-20 ENCOUNTER — Ambulatory Visit (INDEPENDENT_AMBULATORY_CARE_PROVIDER_SITE_OTHER): Payer: Medicare Other | Admitting: Family Medicine

## 2017-03-20 VITALS — BP 132/54 | HR 60 | Temp 98.1°F | Resp 12 | Wt 201.0 lb

## 2017-03-20 DIAGNOSIS — E876 Hypokalemia: Secondary | ICD-10-CM | POA: Diagnosis not present

## 2017-03-20 DIAGNOSIS — R1110 Vomiting, unspecified: Secondary | ICD-10-CM

## 2017-03-20 DIAGNOSIS — M545 Low back pain, unspecified: Secondary | ICD-10-CM

## 2017-03-20 DIAGNOSIS — Z23 Encounter for immunization: Secondary | ICD-10-CM | POA: Diagnosis not present

## 2017-03-20 DIAGNOSIS — R197 Diarrhea, unspecified: Secondary | ICD-10-CM | POA: Diagnosis not present

## 2017-03-20 DIAGNOSIS — Z6837 Body mass index (BMI) 37.0-37.9, adult: Secondary | ICD-10-CM

## 2017-03-20 DIAGNOSIS — I639 Cerebral infarction, unspecified: Secondary | ICD-10-CM

## 2017-03-20 DIAGNOSIS — I10 Essential (primary) hypertension: Secondary | ICD-10-CM

## 2017-03-20 DIAGNOSIS — G8929 Other chronic pain: Secondary | ICD-10-CM | POA: Diagnosis not present

## 2017-03-20 DIAGNOSIS — K219 Gastro-esophageal reflux disease without esophagitis: Secondary | ICD-10-CM | POA: Diagnosis not present

## 2017-03-20 MED ORDER — PREDNISONE 10 MG (21) PO TBPK: ORAL_TABLET | ORAL | 0 refills | Status: DC

## 2017-03-20 NOTE — Progress Notes (Signed)
Dawn [name]  MRN: [id number] DOB: Mar 23, 1934  Subjective:  HPI  Patient is here for 6 months follow up. Last office visit for follow up was on 09/20/16. Patient was seen for diarrhea and vomiting on 02/13/17 by Carles Collet, PA and was sent to ER from our office. Had lab work done there-potassium level was at 2.7. Patient was dehydrated and given IV fluids. Patient is much better at this time. She is having some tenderness in the upper abdominal area and thinks it may be from having episode of vomiting and diarrhea. Some constipation is present and patient has been taking stool softeners  And Miralax.She is much better. Routine lab work with [place] was on 09/21/16 and was stable. She also wants to talk about her back which is bothering her more lately with mainly low back pain and some left leg pain. B/P: Patient checks her b/p sometimes and readings have been around 130s/60s. BP Readings from Last 3 Encounters:  03/20/17 (!) 132/54  02/13/17 135/72  02/13/17 (!) 132/54   Wt Readings from Last 3 Encounters:  03/20/17 201 lb (91.2 kg)  02/13/17 197 lb (89.4 kg)  02/13/17 197 lb (89.4 kg)   GERD: patient takes Omeprazole daily and Zantac as needed. Patient Active Problem List   Diagnosis Date Noted  . Carotid artery narrowing 08/09/2015  . Syncope and collapse 07/30/2015  . Allergic rhinitis 01/05/2015  . Cerebral infarct ([place]) 01/05/2015  . Accelerated essential hypertension 01/05/2015  . Benign gastric polyp 01/05/2015  . Hypercholesteremia 01/05/2015  . Neuropathy 01/05/2015  . Obesity 01/05/2015  . Arthritis of knee, degenerative 01/05/2015  . Borderline diabetes 01/05/2015  . Temporary cerebral vascular dysfunction 01/05/2015  . GERD (gastroesophageal reflux disease) 03/31/2013  . Gastric polyp     Past Medical History:  Diagnosis Date  . Acid reflux   . Arthritis   . Back pain   . Gastric polyp 2014  . Hypertension   . Stomach ulcer   . Stroke [facility])      Social History   Social History  . Marital status: Divorced    Spouse name: none  . Number of children: 1  . Years of education: 70   Occupational History  . retired    Social History Main Topics  . Smoking status: Never Smoker  . Smokeless tobacco: Never Used  . Alcohol use No  . Drug use: No  . Sexual activity: No   Other Topics Concern  . Not on file   Social History Narrative  . No narrative on file    Outpatient Encounter Prescriptions as of 03/20/2017  Medication Sig  . acetaminophen (TYLENOL) 325 MG tablet Take 650 mg by mouth every 6 (six) hours as needed for mild pain.  [name] aspirin EC 81 MG tablet Take 81 mg by mouth daily.  . Biotin 5000 MCG CAPS Take 5,000 mcg by mouth daily.  . Calcium Carbonate-Vitamin D (CALCIUM 600+D) 600-200 MG-UNIT TABS Take 1 tablet by mouth daily.  . fexofenadine (ALLEGRA) 180 MG tablet Take 180 mg by mouth daily.  [name] labetalol (NORMODYNE) 200 MG tablet Take 1 tablet (200 mg total) by mouth 3 (three) times daily.  . Magnesium 250 MG TABS Take 1 tablet by mouth daily.   . Olmesartan-Amlodipine-HCTZ 40-10-25 MG TABS TAKE 1 TABLET BY MOUTH EVERY DAY  . omeprazole (PRILOSEC) 20 MG capsule Take 1 capsule (20 mg total) by mouth daily.  . ranitidine (ZANTAC) 150 MG tablet Take 150 mg by mouth at  bedtime as needed for heartburn.  . simvastatin (ZOCOR) 20 MG tablet Take 1 tablet (20 mg total) by mouth daily. Reported on 11/02/2015 (Patient taking differently: Take 10 mg by mouth daily. Reported on 11/02/2015)  . [DISCONTINUED] traMADol (ULTRAM) 50 MG tablet Take 1 tablet (50 mg total) by mouth every 4 (four) hours as needed. (Patient not taking: Reported on 02/13/2017)   No facility-administered encounter medications on file as of 03/20/2017.     Allergies  Allergen Reactions  . Quinine Anaphylaxis and Swelling  . Levofloxacin Other (See Comments)    Affects tendons  . Statins     She has tried several and has had issues with them like joint  pain  . Celecoxib Rash  . Codeine Rash    Review of Systems  Constitutional: Negative.   HENT: Negative.   Eyes: Negative.   Respiratory: Negative.   Cardiovascular: Negative.   Gastrointestinal: Positive for abdominal pain and constipation. Negative for diarrhea, heartburn (stable on treatment), nausea and vomiting.  Musculoskeletal: Positive for back pain and neck pain (better). Negative for falls.  Skin: Negative.   Neurological: Negative for dizziness.  Endo/Heme/Allergies: Negative.   Psychiatric/Behavioral: Negative.     Objective:  BP (!) 132/54   Pulse 60   Temp 98.1 F (36.7 C)   Resp 12   Wt 201 lb (91.2 kg)   BMI 37.98 kg/m   Physical Exam  Constitutional: She is oriented to person, place, and time and well-developed, well-nourished, and in no distress.  HENT:  Head: Normocephalic and atraumatic.  Right Ear: External ear normal.  Left Ear: External ear normal.  Nose: Nose normal.  Eyes: Conjunctivae are normal. Pupils are equal, round, and reactive to light.  Neck: Normal range of motion. Neck supple.  Cardiovascular: Normal rate, regular rhythm, normal heart sounds and intact distal pulses.  Exam reveals no gallop.   No murmur heard. Pulmonary/Chest: Effort normal and breath sounds normal. No respiratory distress. She has no wheezes.  Abdominal: Soft. She exhibits no distension. There is no tenderness.  Musculoskeletal: She exhibits no edema.  Tender over the SI joint area  Neurological: She is alert and oriented to person, place, and time. She has normal strength and intact cranial nerves. She is not agitated and not disoriented. She displays no weakness, no tremor and normal speech. She exhibits normal muscle tone.  Skin: Skin is warm and dry.  Psychiatric: Mood, memory, affect and judgment normal.   Assessment and Plan :  1. Essential hypertension Stable. Continue current medication. - Renal Function Panel  2. Body mass index (BMI) of 37.0 to 37.9 in  adult Work on lifestyle modifications.  3. Vomiting and diarrhea Resolved at this time. Follow as needed for this issue.  4. Chronic left-sided low back pain without sciatica Worsening again. Uses Tylenol as needed. Has had 2 back surgeries in the past. Will treat with Prednisone. May need referral to physiatry for further work up. Physical therapy was not helpful to the patient. Re check in 2 weeks.  5. Gastroesophageal reflux disease, esophagitis presence not specified Stable. Patient had break through symptoms if she does not take Omeprazole daily. 6. Hypokalemia Re check levels today. - Renal Function Panel  Advised patient that she needs Td updated-no record of this. Patient advised to come in if she gets a cut or abrasion to get this updated/ HPI, Exam and A&P transcribed by Theressa Millard, RMA under direction and in the presence of Miguel Aschoff, MD. I have done  the exam and reviewed the chart and it is accurate to the best of my knowledge. Development worker, community has been used and  any errors in dictation or transcription are unintentional. Miguel Aschoff M.D. [facility]

## 2017-03-21 LAB — RENAL FUNCTION PANEL
Albumin: 4.4 g/dL (ref 3.5–4.7)
BUN/Creatinine Ratio: 14 (ref 12–28)
BUN: 17 mg/dL (ref 8–27)
CHLORIDE: 93 mmol/L — AB (ref 96–106)
CO2: 21 mmol/L (ref 20–29)
Calcium: 9.4 mg/dL (ref 8.7–10.3)
Creatinine, Ser: 1.2 mg/dL — ABNORMAL HIGH (ref 0.57–1.00)
GFR, EST AFRICAN AMERICAN: 49 mL/min/{1.73_m2} — AB (ref >59)
GFR, EST NON AFRICAN AMERICAN: 42 mL/min/{1.73_m2} — AB (ref >59)
GLUCOSE: 96 mg/dL (ref 65–99)
POTASSIUM: 4.2 mmol/L (ref 3.5–5.2)
Phosphorus: 3.3 mg/dL (ref 2.5–4.5)
SODIUM: 132 mmol/L — AB (ref 134–144)

## 2017-03-29 ENCOUNTER — Other Ambulatory Visit: Payer: Self-pay | Admitting: Family Medicine

## 2017-04-03 ENCOUNTER — Encounter: Payer: Self-pay | Admitting: Family Medicine

## 2017-04-03 ENCOUNTER — Ambulatory Visit (INDEPENDENT_AMBULATORY_CARE_PROVIDER_SITE_OTHER): Payer: Medicare Other | Admitting: Family Medicine

## 2017-04-03 VITALS — BP 140/62 | HR 68 | Temp 98.3°F | Resp 16 | Wt 205.0 lb

## 2017-04-03 DIAGNOSIS — M545 Low back pain, unspecified: Secondary | ICD-10-CM

## 2017-04-03 DIAGNOSIS — I10 Essential (primary) hypertension: Secondary | ICD-10-CM

## 2017-04-03 DIAGNOSIS — G8929 Other chronic pain: Secondary | ICD-10-CM

## 2017-04-03 DIAGNOSIS — I639 Cerebral infarction, unspecified: Secondary | ICD-10-CM

## 2017-04-03 DIAGNOSIS — K219 Gastro-esophageal reflux disease without esophagitis: Secondary | ICD-10-CM | POA: Diagnosis not present

## 2017-04-03 NOTE — Progress Notes (Signed)
Subjective:  HPI Pt is here for a 2 week follow up of back pain. She was treated with Prednisone. Per last OV note. May be referral to physiatry, PT did not help pt. She reports that she finished the prednisone and the back pain is not any better. She also reports that she did not sleep for 2 nights when she took it and she has had a headache on her right side on her head near her eye since she was on it.  Pt also reports that she has had a pain in her LUQ. She had gastritis and ended up in the ER on 02/13/17 and she has not been eating much, so she thinks it could be tomato sandwiches she's had this week. She started back on Omeprazole and Zantac last week.  She also has an abrasion on her right arm and is due for Td.  Prior to Admission medications   Medication Sig Start Date End Date Taking? Authorizing Provider  acetaminophen (TYLENOL) 325 MG tablet Take 650 mg by mouth every 6 (six) hours as needed for mild pain.    [provider]  aspirin EC 81 MG tablet Take 81 mg by mouth daily.    [provider]  Biotin 5000 MCG CAPS Take 5,000 mcg by mouth daily.    [provider]  Calcium Carbonate-Vitamin D (CALCIUM 600+D) 600-200 MG-UNIT TABS Take 1 tablet by mouth daily.    [provider]  fexofenadine (ALLEGRA) 180 MG tablet Take 180 mg by mouth daily.    [provider]  labetalol (NORMODYNE) 200 MG tablet TAKE 1 TABLET BY MOUTH 3  TIMES DAILY 03/29/17   Jerrol Banana., MD  Magnesium 250 MG TABS Take 1 tablet by mouth daily.     [provider]  Olmesartan-Amlodipine-HCTZ 40-10-25 MG TABS TAKE 1 TABLET BY MOUTH EVERY DAY 12/28/16   Jerrol Banana., MD  omeprazole (PRILOSEC) 20 MG capsule TAKE 1 CAPSULE BY MOUTH  DAILY 03/29/17   Jerrol Banana., MD  predniSONE (STERAPRED UNI-PAK 21 TAB) 10 MG (21) TBPK tablet As directed 03/20/17   Jerrol Banana., MD  ranitidine (ZANTAC) 150 MG tablet Take 150 mg by mouth at  bedtime as needed for heartburn.    [provider]  simvastatin (ZOCOR) 20 MG tablet Take 1 tablet (20 mg total) by mouth daily. Reported on 11/02/2015 Patient taking differently: Take 10 mg by mouth daily. Reported on 11/02/2015 02/16/16   Jerrol Banana., MD    Patient Active Problem List   Diagnosis Date Noted  . Carotid artery narrowing 08/09/2015  . Bilateral carotid artery stenosis 08/09/2015  . Syncope and collapse 07/30/2015  . Allergic rhinitis 01/05/2015  . Cerebral infarct ([place]) 01/05/2015  . Accelerated essential hypertension 01/05/2015  . Benign gastric polyp 01/05/2015  . Hypercholesteremia 01/05/2015  . Neuropathy 01/05/2015  . Obesity 01/05/2015  . Arthritis of knee, degenerative 01/05/2015  . Borderline diabetes 01/05/2015  . Temporary cerebral vascular dysfunction 01/05/2015  . GERD (gastroesophageal reflux disease) 03/31/2013  . Gastric polyp     Past Medical History:  Diagnosis Date  . Acid reflux   . Arthritis   . Back pain   . Gastric polyp 2014  . Hypertension   . Stomach ulcer   . Stroke [facility])     Social History   Social History  . Marital status: Divorced    Spouse name: none  . Number of children: 1  .  Years of education: 58   Occupational History  . retired    Social History Main Topics  . Smoking status: Never Smoker  . Smokeless tobacco: Never Used  . Alcohol use No  . Drug use: No  . Sexual activity: No   Other Topics Concern  . Not on file   Social History Narrative  . No narrative on file    Allergies  Allergen Reactions  . Quinine Anaphylaxis and Swelling  . Levofloxacin Other (See Comments)    Affects tendons  . Statins     She has tried several and has had issues with them like joint pain  . Celecoxib Rash  . Codeine Rash    Review of Systems  Constitutional: Negative.   HENT: Negative.   Eyes: Negative.   Respiratory: Negative.   Cardiovascular: Negative.   Gastrointestinal: Positive for  abdominal pain.  Genitourinary: Negative.   Musculoskeletal: Positive for back pain.  Skin: Negative.   Neurological: Positive for headaches.  Endo/Heme/Allergies: Negative.   Psychiatric/Behavioral: Negative.     Immunization History  Administered Date(s) Administered  . Influenza, High Dose Seasonal PF 06/16/2015, 06/29/2016  . Pneumococcal Conjugate-13 02/17/2015  . Pneumococcal Polysaccharide-23 08/14/2012    Objective:  BP 140/62 (BP Location: Left Arm, Patient Position: Sitting, Cuff Size: Large)   Pulse 68   Temp 98.3 F (36.8 C) (Oral)   Resp 16   Wt 205 lb (93 kg)   BMI 38.73 kg/m   Physical Exam  Constitutional: She is oriented to person, place, and time and well-developed, well-nourished, and in no distress.  Eyes: Pupils are equal, round, and reactive to light. Conjunctivae and EOM are normal.  Neck: Normal range of motion. Neck supple.  Cardiovascular: Normal rate, regular rhythm, normal heart sounds and intact distal pulses.   Pulmonary/Chest: Effort normal and breath sounds normal.  Abdominal: Soft. Bowel sounds are normal.  Musculoskeletal: Normal range of motion.  Neurological: She is alert and oriented to person, place, and time. She has normal reflexes. Gait normal. GCS score is 15.  Skin: Skin is warm and dry.  Psychiatric: Mood, memory, affect and judgment normal.    Lab Results  Component Value Date   WBC 6.6 02/13/2017   HGB 13.5 02/13/2017   HCT 37.9 02/13/2017   PLT 232 02/13/2017   GLUCOSE 96 03/20/2017   CHOL 158 09/21/2016   TRIG 109 09/21/2016   HDL 59 09/21/2016   LDLCALC 77 09/21/2016   TSH 2.770 09/21/2016   INR 0.95 07/30/2015   HGBA1C 5.4 09/21/2016    CMP     Component Value Date/Time   NA 132 (L) 03/20/2017 1621   NA 134 (L) 12/20/2013 0910   K 4.2 03/20/2017 1621   K 3.4 (L) 12/24/2013 0435   CL 93 (L) 03/20/2017 1621   CL 102 12/20/2013 0910   CO2 21 03/20/2017 1621   CO2 26 12/20/2013 0910   GLUCOSE 96 03/20/2017  1621   GLUCOSE 100 (H) 02/13/2017 1153   GLUCOSE 119 (H) 12/20/2013 0910   BUN 17 03/20/2017 1621   BUN 12 12/20/2013 0910   CREATININE 1.20 (H) 03/20/2017 1621   CREATININE 0.91 12/24/2013 0435   CALCIUM 9.4 03/20/2017 1621   CALCIUM 7.9 (L) 12/20/2013 0910   PROT 7.1 02/13/2017 1153   PROT 6.3 09/21/2016 1111   PROT 7.1 10/13/2013 2057   ALBUMIN 4.4 03/20/2017 1621   ALBUMIN 3.3 (L) 10/13/2013 2057   AST 28 02/13/2017 1153   AST 25 10/13/2013  2057   ALT 23 02/13/2017 1153   ALT 16 10/13/2013 2057   ALKPHOS 66 02/13/2017 1153   ALKPHOS 96 10/13/2013 2057   BILITOT 1.0 02/13/2017 1153   BILITOT 0.4 09/21/2016 1111   BILITOT 0.5 10/13/2013 2057   GFRNONAA 42 (L) 03/20/2017 1621   GFRNONAA >60 12/24/2013 0435   GFRAA 49 (L) 03/20/2017 1621   GFRAA >60 12/24/2013 0435    Assessment and Plan :  1. Chronic left-sided low back pain without sciatica Not improved refer to Dr. Sharlet Salina. Follow up in 2 months - Ambulatory referral to Physical Medicine Rehab  2. Gastroesophageal reflux disease, esophagitis presence not specified Pt advised to take Omeprazole and Zantac for LUQ pain.   3.HTN  HPI, Exam, and A&P Transcribed under the direction and in the presence of Richard L. Cranford Mon, MD  Electronically Signed: Katina Dung, CMA I have done the exam and reviewed the above chart and it is accurate to the best of my knowledge. Development worker, community has been used in this note in any air is in the dictation or transcription are unintentional.  [facility] 04/03/2017 4:12 PM

## 2017-05-21 ENCOUNTER — Other Ambulatory Visit: Payer: Self-pay | Admitting: Physical Medicine and Rehabilitation

## 2017-05-21 DIAGNOSIS — M5136 Other intervertebral disc degeneration, lumbar region: Secondary | ICD-10-CM | POA: Diagnosis not present

## 2017-05-21 DIAGNOSIS — M48062 Spinal stenosis, lumbar region with neurogenic claudication: Secondary | ICD-10-CM | POA: Diagnosis not present

## 2017-05-21 DIAGNOSIS — M5416 Radiculopathy, lumbar region: Secondary | ICD-10-CM | POA: Diagnosis not present

## 2017-06-05 ENCOUNTER — Ambulatory Visit: Payer: Self-pay | Admitting: Family Medicine

## 2017-06-05 ENCOUNTER — Ambulatory Visit
Admission: RE | Admit: 2017-06-05 | Discharge: 2017-06-05 | Disposition: A | Discharge status: Home / Self Care | Payer: Medicare Other | Source: Ambulatory Visit | Attending: Physical Medicine and Rehabilitation | Admitting: Physical Medicine and Rehabilitation

## 2017-06-05 DIAGNOSIS — M4854XA Collapsed vertebra, not elsewhere classified, thoracic region, initial encounter for fracture: Secondary | ICD-10-CM | POA: Diagnosis not present

## 2017-06-05 DIAGNOSIS — Z9889 Other specified postprocedural states: Secondary | ICD-10-CM | POA: Diagnosis not present

## 2017-06-05 DIAGNOSIS — M5136 Other intervertebral disc degeneration, lumbar region: Secondary | ICD-10-CM | POA: Diagnosis not present

## 2017-06-05 DIAGNOSIS — N1330 Unspecified hydronephrosis: Secondary | ICD-10-CM | POA: Diagnosis not present

## 2017-06-05 DIAGNOSIS — M545 Low back pain: Secondary | ICD-10-CM | POA: Diagnosis not present

## 2017-06-05 DIAGNOSIS — M5416 Radiculopathy, lumbar region: Secondary | ICD-10-CM | POA: Diagnosis not present

## 2017-06-05 DIAGNOSIS — M48062 Spinal stenosis, lumbar region with neurogenic claudication: Secondary | ICD-10-CM | POA: Diagnosis not present

## 2017-06-05 DIAGNOSIS — M4856XA Collapsed vertebra, not elsewhere classified, lumbar region, initial encounter for fracture: Secondary | ICD-10-CM | POA: Diagnosis not present

## 2017-06-13 ENCOUNTER — Telehealth: Payer: Self-pay

## 2017-06-13 DIAGNOSIS — N1330 Unspecified hydronephrosis: Secondary | ICD-10-CM

## 2017-06-13 NOTE — Telephone Encounter (Signed)
Needs renal ultrasound for hydronephrosis on MRI. Please order this. See me next week. After [place].

## 2017-06-13 NOTE — Telephone Encounter (Signed)
Renal ultrasound ordered. Patient advised to call back to schedule a follow up with Dr. Rosanna Randy after she has the ultrasound done. Patient verbalized understanding.

## 2017-06-13 NOTE — Telephone Encounter (Signed)
Patient is calling to request MRI results from 06/05/17. Dr. Sharlet Salina ordered to MRI, but patient states his office told her that patient's PCP would be contacting her with results and treatment plan if applicable. Please advise. CB# [phone]

## 2017-06-19 ENCOUNTER — Ambulatory Visit
Admission: RE | Admit: 2017-06-19 | Discharge: 2017-06-19 | Disposition: A | Discharge status: Home / Self Care | Payer: Medicare Other | Source: Ambulatory Visit | Attending: Family Medicine | Admitting: Family Medicine

## 2017-06-19 DIAGNOSIS — N261 Atrophy of kidney (terminal): Secondary | ICD-10-CM | POA: Insufficient documentation

## 2017-06-19 DIAGNOSIS — N1330 Unspecified hydronephrosis: Secondary | ICD-10-CM | POA: Insufficient documentation

## 2017-06-21 DIAGNOSIS — M5136 Other intervertebral disc degeneration, lumbar region: Secondary | ICD-10-CM | POA: Diagnosis not present

## 2017-06-21 DIAGNOSIS — M48062 Spinal stenosis, lumbar region with neurogenic claudication: Secondary | ICD-10-CM | POA: Diagnosis not present

## 2017-06-21 DIAGNOSIS — M5416 Radiculopathy, lumbar region: Secondary | ICD-10-CM | POA: Diagnosis not present

## 2017-06-26 DIAGNOSIS — R0602 Shortness of breath: Secondary | ICD-10-CM | POA: Diagnosis not present

## 2017-06-26 DIAGNOSIS — E7800 Pure hypercholesterolemia, unspecified: Secondary | ICD-10-CM | POA: Diagnosis not present

## 2017-06-26 DIAGNOSIS — I6523 Occlusion and stenosis of bilateral carotid arteries: Secondary | ICD-10-CM | POA: Diagnosis not present

## 2017-06-26 DIAGNOSIS — I10 Essential (primary) hypertension: Secondary | ICD-10-CM | POA: Diagnosis not present

## 2017-06-28 ENCOUNTER — Ambulatory Visit (INDEPENDENT_AMBULATORY_CARE_PROVIDER_SITE_OTHER): Payer: Medicare Other

## 2017-06-28 VITALS — BP 160/70 | HR 60 | Temp 98.8°F | Ht 61.0 in | Wt 197.8 lb

## 2017-06-28 DIAGNOSIS — Z23 Encounter for immunization: Secondary | ICD-10-CM

## 2017-06-28 DIAGNOSIS — Z0000 Encounter for general adult medical examination without abnormal findings: Secondary | ICD-10-CM | POA: Diagnosis not present

## 2017-06-28 NOTE — Progress Notes (Signed)
Subjective:   Dawn [name] is a 83 y.o. female who presents for Medicare Annual (Subsequent) preventive examination.  Review of Systems:  N/A  Cardiac Risk Factors include: advanced age (>53men, >4 women);dyslipidemia;hypertension;obesity (BMI >30kg/m2)     Objective:     Vitals: BP (!) 160/70 (BP Location: Left Arm)   Pulse 60   Temp 98.8 F (37.1 C) (Oral)   Ht 5\' 1"  (1.549 m)   Wt 197 lb 12.8 oz (89.7 kg)   BMI 37.37 kg/m   Body mass index is 37.37 kg/m.   Tobacco History  Smoking Status  . Never Smoker  Smokeless Tobacco  . Never Used     Counseling given: Not Answered   Past Medical History:  Diagnosis Date  . Acid reflux   . Arthritis   . Back pain   . Gastric polyp 2014  . Hypertension   . Stomach ulcer   . Stroke [facility])    Past Surgical History:  Procedure Laterality Date  . BACK SURGERY  2011   vertebroplasty   . CARDIAC CATHETERIZATION  2007   normal, [employer] [facility] [place]   . CARPAL TUNNEL RELEASE  2007  . cataract surgery   2013  . COLON SURGERY  2015   lap sigmoid resection  . COLONOSCOPY  2011, 2015   Dr. Nicolasa Ducking, Dr Melody Comas  . LAMINECTOMY  2011   decompression   . UPPER GI ENDOSCOPY  2011  . VASCULAR SURGERY Right 2010   right rental artery    Family History  Problem Relation Age of Onset  . Cancer Father        stomach  . Emphysema Father   . Hypertension Mother   . Atrial fibrillation Mother   . Diabetes Sister   . Hypertension Sister   . Skin cancer Sister   . Hypertension Brother   . Heart disease Brother   . Heart disease Sister   . Colon polyps Sister   . Hypertension Sister   . Thyroid disease Sister   . Irritable bowel syndrome Sister   . Heart disease Brother   . Colon polyps Brother   . Diabetes Brother   . Hypertension Brother   . Neuropathy Brother   . Post-traumatic stress disorder Brother   . Hypertension Brother   . Breast cancer Paternal Aunt   . Breast cancer Paternal Grandmother   .  Breast cancer Paternal Aunt   . Breast cancer Paternal Aunt   . Breast cancer Paternal Aunt    History  Sexual Activity  . Sexual activity: No    Outpatient Encounter Prescriptions as of 06/28/2017  Medication Sig  . acetaminophen (TYLENOL) 325 MG tablet Take 650 mg by mouth every 6 (six) hours as needed for mild pain.  [name] aspirin EC 81 MG tablet Take 81 mg by mouth daily.  . Biotin 5000 MCG CAPS Take 5,000 mcg by mouth daily.  . Calcium Carbonate-Vitamin D (CALCIUM 600+D) 600-200 MG-UNIT TABS Take 1 tablet by mouth daily.  . fexofenadine (ALLEGRA) 180 MG tablet Take 180 mg by mouth daily.  [name] labetalol (NORMODYNE) 200 MG tablet TAKE 1 TABLET BY MOUTH 3  TIMES DAILY  . Magnesium 250 MG TABS Take 1 tablet by mouth daily.   . Olmesartan-Amlodipine-HCTZ 40-10-25 MG TABS TAKE 1 TABLET BY MOUTH EVERY DAY  . omeprazole (PRILOSEC) 20 MG capsule TAKE 1 CAPSULE BY MOUTH  DAILY  . simvastatin (ZOCOR) 20 MG tablet Take 1 tablet (20 mg total) by mouth  daily. Reported on 11/02/2015 (Patient taking differently: Take 10 mg by mouth daily. Reported on 11/02/2015)  . [DISCONTINUED] predniSONE (STERAPRED UNI-PAK 21 TAB) 10 MG (21) TBPK tablet As directed (Patient not taking: Reported on 04/03/2017)  . [DISCONTINUED] ranitidine (ZANTAC) 150 MG tablet Take 150 mg by mouth at bedtime as needed for heartburn.   No facility-administered encounter medications on file as of 06/28/2017.     Activities of Daily Living In your present state of health, do you have any difficulty performing the following activities: 06/28/2017  Hearing? N  Vision? N  Difficulty concentrating or making decisions? N  Walking or climbing stairs? Y  Comment due to back pain  Dressing or bathing? N  Doing errands, shopping? N  Preparing Food and eating ? N  Using the Toilet? N  In the past six months, have you accidently leaked urine? Y  Comment wears protection  Do you have problems with loss of bowel control? N  Managing your  Medications? N  Managing your Finances? N  Housekeeping or managing your Housekeeping? N  Some recent data might be hidden    Patient Care Team: Jerrol Banana., MD as PCP - General (Unknown Physician Specialty) Leandrew Koyanagi, MD as Referring Physician (Ophthalmology) Sharlet Salina, MD as Referring Physician (Physical Medicine and Rehabilitation)    Assessment:     Exercise Activities and Dietary recommendations Current Exercise Habits: The patient does not participate in regular exercise at present, Exercise limited by: orthopedic condition(s)  Goals    . Eat more fruits and vegetables          Recommend increasing amount of fruits and vegetables in daily diet to 2 servings a day of each.       Fall Risk Fall Risk  06/28/2017 06/15/2016 06/16/2015 02/17/2015  Falls in the past year? No Yes No No  Number falls in past yr: - 1 - -  Injury with Fall? - No - -  Risk for fall due to : - - - Impaired balance/gait   Depression Screen PHQ 2/9 Scores 06/28/2017 06/15/2016 06/16/2015 02/17/2015  PHQ - 2 Score 2 0 0 0  PHQ- 9 Score 4 - - -     Cognitive Function     6CIT Screen 06/28/2017  What Year? 0 points  What month? 0 points  What time? 0 points  Count back from 20 0 points  Months in reverse 0 points  Repeat phrase 2 points  Total Score 2    Immunization History  Administered Date(s) Administered  . Influenza, High Dose Seasonal PF 06/16/2015, 06/29/2016, 06/28/2017  . Influenza,inj,quad, With Preservative 09/11/2016  . Pneumococcal Conjugate-13 02/17/2015  . Pneumococcal Polysaccharide-23 08/14/2012  . Pneumococcal-Unspecified 09/11/2016   Screening Tests Health Maintenance  Topic Date Due  . TETANUS/TDAP  01/10/2023  . INFLUENZA VACCINE  Completed  . DEXA SCAN  Completed  . PNA vac Low Risk Adult  Completed      Plan:  I have personally reviewed and addressed the Medicare Annual Wellness questionnaire and have noted the following in the  patient's chart:  A. Medical and social history B. Use of alcohol, tobacco or illicit drugs  C. Current medications and supplements D. Functional ability and status E.  Nutritional status F.  Physical activity G. Advance directives H. List of other physicians I.  Hospitalizations, surgeries, and ER visits in previous 12 months J.  [place] such as hearing and vision if needed, cognitive and depression L. Referrals and appointments -  none  In addition, I have reviewed and discussed with patient certain preventive protocols, quality metrics, and best practice recommendations. A written personalized care plan for preventive services as well as general preventive health recommendations were provided to patient.  See attached scanned questionnaire for additional information.   Signed,  Fabio Neighbors, LPN Nurse Health Advisor   MD Recommendations: None.

## 2017-06-28 NOTE — Patient Instructions (Signed)
Dawn [name] , Thank you for taking time to come for your Medicare Wellness Visit. I appreciate your ongoing commitment to your health goals. Please review the following plan we discussed and let me know if I can assist you in the future.   Screening recommendations/referrals: Colonoscopy: Up to date Mammogram: Up to date Bone Density: completed Recommended yearly ophthalmology/optometry visit for glaucoma screening and checkup Recommended yearly dental visit for hygiene and checkup  Vaccinations: Influenza vaccine: completed Pneumococcal vaccine: completed series Tdap vaccine: Up to date Shingles vaccine: declined  Advanced directives: Advance directive discussed with you today. Even though you declined this today please call our office should you change your mind and we can give you the proper paperwork for you to fill out.  Conditions/risks identified: Obesity; Recommend increasing amount of fruits and vegetables in daily diet to 2 servings a day of each.   Next appointment: 07/07/17   Preventive Care 65 Years and Older, Female Preventive care refers to lifestyle choices and visits with your health care provider that can promote health and wellness. What does preventive care include?  A yearly physical exam. This is also called an annual well check.  Dental exams once or twice a year.  Routine eye exams. Ask your health care provider how often you should have your eyes checked.  Personal lifestyle choices, including:  Daily care of your teeth and gums.  Regular physical activity.  Eating a healthy diet.  Avoiding tobacco and drug use.  Limiting alcohol use.  Practicing safe sex.  Taking low-dose aspirin every day.  Taking vitamin and mineral supplements as recommended by your health care provider. What happens during an annual well check? The services and screenings done by your health care provider during your annual well check will depend on your age, overall  health, lifestyle risk factors, and family history of disease. Counseling  Your health care provider may ask you questions about your:  Alcohol use.  Tobacco use.  Drug use.  Emotional well-being.  Home and relationship well-being.  Sexual activity.  Eating habits.  History of falls.  Memory and ability to understand (cognition).  Work and work Statistician.  Reproductive health. Screening  You may have the following tests or measurements:  Height, weight, and BMI.  Blood pressure.  Lipid and cholesterol levels. These may be checked every 5 years, or more frequently if you are over 38 years old.  Skin check.  Lung cancer screening. You may have this screening every year starting at age 77 if you have a 30-pack-year history of smoking and currently smoke or have quit within the past 15 years.  Fecal occult blood test (FOBT) of the stool. You may have this test every year starting at age 25.  Flexible sigmoidoscopy or colonoscopy. You may have a sigmoidoscopy every 5 years or a colonoscopy every 10 years starting at age 62.  Hepatitis C blood test.  Hepatitis B blood test.  Sexually transmitted disease (STD) testing.  Diabetes screening. This is done by checking your blood sugar (glucose) after you have not eaten for a while (fasting). You may have this done every 1-3 years.  Bone density scan. This is done to screen for osteoporosis. You may have this done starting at age 72.  Mammogram. This may be done every 1-2 years. Talk to your health care provider about how often you should have regular mammograms. Talk with your health care provider about your test results, treatment options, and if necessary, the need for more  tests. Vaccines  Your health care provider may recommend certain vaccines, such as:  Influenza vaccine. This is recommended every year.  Tetanus, diphtheria, and acellular pertussis (Tdap, Td) vaccine. You may need a Td booster every 10  years.  Zoster vaccine. You may need this after age 66.  Pneumococcal 13-valent conjugate (PCV13) vaccine. One dose is recommended after age 62.  Pneumococcal polysaccharide (PPSV23) vaccine. One dose is recommended after age 22. Talk to your health care provider about which screenings and vaccines you need and how often you need them. This information is not intended to replace advice given to you by your health care provider. Make sure you discuss any questions you have with your health care provider. Document Released: 09/24/2015 Document Revised: 05/17/2016 Document Reviewed: 06/29/2015 Elsevier Interactive Patient Education  2017 [place] Prevention in the Home Falls can cause injuries. They can happen to people of all ages. There are many things you can do to make your home safe and to help prevent falls. What can I do on the outside of my home?  Regularly fix the edges of walkways and driveways and fix any cracks.  Remove anything that might make you trip as you walk through a door, such as a raised step or threshold.  Trim any bushes or trees on the path to your home.  Use bright outdoor lighting.  Clear any walking paths of anything that might make someone trip, such as rocks or tools.  Regularly check to see if handrails are loose or broken. Make sure that both sides of any steps have handrails.  Any raised decks and porches should have guardrails on the edges.  Have any leaves, snow, or ice cleared regularly.  Use sand or salt on walking paths during winter.  Clean up any spills in your garage right away. This includes oil or grease spills. What can I do in the bathroom?  Use night lights.  Install grab bars by the toilet and in the tub and shower. Do not use towel bars as grab bars.  Use non-skid mats or decals in the tub or shower.  If you need to sit down in the shower, use a plastic, non-slip stool.  Keep the floor dry. Clean up any water that  spills on the floor as soon as it happens.  Remove soap buildup in the tub or shower regularly.  Attach bath mats securely with double-sided non-slip rug tape.  Do not have throw rugs and other things on the floor that can make you trip. What can I do in the bedroom?  Use night lights.  Make sure that you have a light by your bed that is easy to reach.  Do not use any sheets or blankets that are too big for your bed. They should not hang down onto the floor.  Have a firm chair that has side arms. You can use this for support while you get dressed.  Do not have throw rugs and other things on the floor that can make you trip. What can I do in the kitchen?  Clean up any spills right away.  Avoid walking on wet floors.  Keep items that you use a lot in easy-to-reach places.  If you need to reach something above you, use a strong step stool that has a grab bar.  Keep electrical cords out of the way.  Do not use floor polish or wax that makes floors slippery. If you must use wax, use non-skid floor  wax.  Do not have throw rugs and other things on the floor that can make you trip. What can I do with my stairs?  Do not leave any items on the stairs.  Make sure that there are handrails on both sides of the stairs and use them. Fix handrails that are broken or loose. Make sure that handrails are as long as the stairways.  Check any carpeting to make sure that it is firmly attached to the stairs. Fix any carpet that is loose or worn.  Avoid having throw rugs at the top or bottom of the stairs. If you do have throw rugs, attach them to the floor with carpet tape.  Make sure that you have a light switch at the top of the stairs and the bottom of the stairs. If you do not have them, ask someone to add them for you. What else can I do to help prevent falls?  Wear shoes that:  Do not have high heels.  Have rubber bottoms.  Are comfortable and fit you well.  Are closed at the  toe. Do not wear sandals.  If you use a stepladder:  Make sure that it is fully opened. Do not climb a closed stepladder.  Make sure that both sides of the stepladder are locked into place.  Ask someone to hold it for you, if possible.  Clearly mark and make sure that you can see:  Any grab bars or handrails.  First and last steps.  Where the edge of each step is.  Use tools that help you move around (mobility aids) if they are needed. These include:  Canes.  Walkers.  Scooters.  Crutches.  Turn on the lights when you go into a dark area. Replace any light bulbs as soon as they burn out.  Set up your furniture so you have a clear path. Avoid moving your furniture around.  If any of your floors are uneven, fix them.  If there are any pets around you, be aware of where they are.  Review your medicines with your doctor. Some medicines can make you feel dizzy. This can increase your chance of falling. Ask your doctor what other things that you can do to help prevent falls. This information is not intended to replace advice given to you by your health care provider. Make sure you discuss any questions you have with your health care provider. Document Released: 06/24/2009 Document Revised: 02/03/2016 Document Reviewed: 10/02/2014 Elsevier Interactive Patient Education  2017 [employer].

## 2017-07-02 ENCOUNTER — Encounter: Payer: Self-pay | Admitting: Family Medicine

## 2017-07-02 ENCOUNTER — Ambulatory Visit (INDEPENDENT_AMBULATORY_CARE_PROVIDER_SITE_OTHER): Payer: Medicare Other | Admitting: Family Medicine

## 2017-07-02 VITALS — BP 152/60 | HR 64 | Temp 97.7°F | Resp 14 | Wt 198.0 lb

## 2017-07-02 DIAGNOSIS — I639 Cerebral infarction, unspecified: Secondary | ICD-10-CM

## 2017-07-02 DIAGNOSIS — I10 Essential (primary) hypertension: Secondary | ICD-10-CM | POA: Diagnosis not present

## 2017-07-02 DIAGNOSIS — N1330 Unspecified hydronephrosis: Secondary | ICD-10-CM | POA: Diagnosis not present

## 2017-07-02 NOTE — Progress Notes (Signed)
Patient: Dawn [name] Female    DOB: 09/24/1933   83 y.o.   MRN: [id number] Visit Date: 07/02/2017  Today's Provider: Wilhemena Durie, MD   Chief Complaint  Patient presents with  . Follow-up   Subjective:    HPI Pt is here today for a follow up of hydronephrosis on MRI US done on 06/19/17 needs nephrologist referral (order no put in yet). Pt reports that she is feeling ok other than her back bothering her, she is seeing Dr. Sharlet Salina for her back. She would like to know if Dr. Rosanna Randy thinks she needs to have another injection in her back since the other injection did not work. Also if since the injection has dye would her kidneys be damaged from it.       Allergies  Allergen Reactions  . Quinine Anaphylaxis and Swelling  . Levofloxacin Other (See Comments)    Affects tendons  . Statins     She has tried several and has had issues with them like joint pain  . Celecoxib Rash  . Codeine Rash     Current Outpatient Prescriptions:  .  acetaminophen (TYLENOL) 325 MG tablet, Take 650 mg by mouth every 6 (six) hours as needed for mild pain., Disp: , Rfl:  .  aspirin EC 81 MG tablet, Take 81 mg by mouth daily., Disp: , Rfl:  .  Biotin 5000 MCG CAPS, Take 5,000 mcg by mouth daily., Disp: , Rfl:  .  Calcium Carbonate-Vitamin D (CALCIUM 600+D) 600-200 MG-UNIT TABS, Take 1 tablet by mouth daily., Disp: , Rfl:  .  fexofenadine (ALLEGRA) 180 MG tablet, Take 180 mg by mouth daily., Disp: , Rfl:  .  labetalol (NORMODYNE) 200 MG tablet, TAKE 1 TABLET BY MOUTH 3  TIMES DAILY, Disp: 270 tablet, Rfl: 3 .  Magnesium 250 MG TABS, Take 1 tablet by mouth daily. , Disp: , Rfl:  .  Olmesartan-Amlodipine-HCTZ 40-10-25 MG TABS, TAKE 1 TABLET BY MOUTH EVERY DAY, Disp: 90 tablet, Rfl: 3 .  omeprazole (PRILOSEC) 20 MG capsule, TAKE 1 CAPSULE BY MOUTH  DAILY, Disp: 90 capsule, Rfl: 3 .  simvastatin (ZOCOR) 20 MG tablet, Take 1 tablet (20 mg total) by mouth daily. Reported on 11/02/2015 (Patient  taking differently: Take 10 mg by mouth daily. Reported on 11/02/2015), Disp: 30 tablet, Rfl: 12  Review of Systems  Constitutional: Negative.   HENT: Negative.   Eyes: Negative.   Respiratory: Negative.   Cardiovascular: Negative.   Gastrointestinal: Negative.   Endocrine: Negative.   Genitourinary: Negative.   Musculoskeletal: Positive for back pain.  Skin: Negative.   Allergic/Immunologic: Negative.   Neurological: Negative.   Hematological: Negative.   Psychiatric/Behavioral: Negative.     Social History  Substance Use Topics  . Smoking status: Never Smoker  . Smokeless tobacco: Never Used  . Alcohol use No   Objective:   BP (!) 152/60 (BP Location: Left Arm, Patient Position: Sitting, Cuff Size: Large)   Pulse 64   Temp 97.7 F (36.5 C) (Oral)   Resp 14   Wt 198 lb (89.8 kg)   BMI 37.41 kg/m  Vitals:   07/02/17 1611  BP: (!) 152/60  Pulse: 64  Resp: 14  Temp: 97.7 F (36.5 C)  TempSrc: Oral  Weight: 198 lb (89.8 kg)     Physical Exam  Constitutional: She is oriented to person, place, and time. She appears well-developed and well-nourished.  Eyes: Pupils are equal, round, and reactive to  light. Conjunctivae and EOM are normal.  Neck: Normal range of motion. Neck supple.  Cardiovascular: Normal rate, regular rhythm, normal heart sounds and intact distal pulses.   Pulmonary/Chest: Effort normal and breath sounds normal.  Musculoskeletal: Normal range of motion.  Neurological: She is alert and oriented to person, place, and time. She has normal reflexes.  Skin: Skin is warm and dry.  Psychiatric: She has a normal mood and affect. Her behavior is normal. Judgment and thought content normal.        Assessment & Plan:     1. Hydronephrosis, unspecified hydronephrosis type Mild-moderate. Clinically pt doing well. - Ambulatory referral to Nephrology - Renal function panel  2. Essential hypertension 130/60 recently. Renal artrery stenosis likely. - Renal  function panel     HPI, Exam, and A&P Transcribed under the direction and in the presence of Richard L. Cranford Mon, MD  Electronically Signed: Katina Dung, CMA  I have done the exam and reviewed the above chart and it is accurate to the best of my knowledge. Development worker, community has been used in this note in any air is in the dictation or transcription are unintentional.  Wilhemena Durie, MD  [place]

## 2017-07-03 LAB — RENAL FUNCTION PANEL
Albumin: 4.3 g/dL (ref 3.6–5.1)
BUN / CREAT RATIO: 19 (calc) (ref 6–22)
BUN: 26 mg/dL — ABNORMAL HIGH (ref 7–25)
CHLORIDE: 99 mmol/L (ref 98–110)
CO2: 28 mmol/L (ref 20–32)
CREATININE: 1.38 mg/dL — AB (ref 0.60–0.88)
Calcium: 10.2 mg/dL (ref 8.6–10.4)
Glucose, Bld: 102 mg/dL — ABNORMAL HIGH (ref 65–99)
POTASSIUM: 4 mmol/L (ref 3.5–5.3)
Phosphorus: 3.3 mg/dL (ref 2.1–4.3)
SODIUM: 135 mmol/L (ref 135–146)

## 2017-07-05 ENCOUNTER — Telehealth: Payer: Self-pay

## 2017-07-05 NOTE — Telephone Encounter (Signed)
Patient states she has an appointment with Dr Candiss Norse that we referred her to but its not till November 21st she wanted to make sure it was ok to wait that long-Anastasiya Estell Harpin, RMA

## 2017-07-06 NOTE — Telephone Encounter (Signed)
Pt advised-[name] Dawn [name], RMA  

## 2017-07-06 NOTE — Telephone Encounter (Signed)
ok 

## 2017-07-07 ENCOUNTER — Ambulatory Visit: Payer: Medicare Other | Admitting: Family Medicine

## 2017-07-12 DIAGNOSIS — M48062 Spinal stenosis, lumbar region with neurogenic claudication: Secondary | ICD-10-CM | POA: Diagnosis not present

## 2017-07-12 DIAGNOSIS — M5136 Other intervertebral disc degeneration, lumbar region: Secondary | ICD-10-CM | POA: Diagnosis not present

## 2017-07-12 DIAGNOSIS — M5416 Radiculopathy, lumbar region: Secondary | ICD-10-CM | POA: Diagnosis not present

## 2017-07-19 DIAGNOSIS — H0012 Chalazion right lower eyelid: Secondary | ICD-10-CM | POA: Diagnosis not present

## 2017-07-20 DIAGNOSIS — H0012 Chalazion right lower eyelid: Secondary | ICD-10-CM | POA: Diagnosis not present

## 2017-07-31 DIAGNOSIS — R600 Localized edema: Secondary | ICD-10-CM | POA: Diagnosis not present

## 2017-07-31 DIAGNOSIS — N1330 Unspecified hydronephrosis: Secondary | ICD-10-CM | POA: Diagnosis not present

## 2017-07-31 DIAGNOSIS — N183 Chronic kidney disease, stage 3 (moderate): Secondary | ICD-10-CM | POA: Diagnosis not present

## 2017-07-31 DIAGNOSIS — I10 Essential (primary) hypertension: Secondary | ICD-10-CM | POA: Diagnosis not present

## 2017-07-31 DIAGNOSIS — N261 Atrophy of kidney (terminal): Secondary | ICD-10-CM | POA: Diagnosis not present

## 2017-08-07 ENCOUNTER — Other Ambulatory Visit: Payer: Self-pay | Admitting: Nephrology

## 2017-08-07 DIAGNOSIS — N1330 Unspecified hydronephrosis: Secondary | ICD-10-CM

## 2017-08-14 ENCOUNTER — Ambulatory Visit
Admission: RE | Admit: 2017-08-14 | Discharge: 2017-08-14 | Disposition: A | Discharge status: Home / Self Care | Payer: Medicare Other | Source: Ambulatory Visit | Attending: Nephrology | Admitting: Nephrology

## 2017-08-14 DIAGNOSIS — I700 Atherosclerosis of aorta: Secondary | ICD-10-CM | POA: Insufficient documentation

## 2017-08-14 DIAGNOSIS — N261 Atrophy of kidney (terminal): Secondary | ICD-10-CM | POA: Diagnosis not present

## 2017-08-14 DIAGNOSIS — N1330 Unspecified hydronephrosis: Secondary | ICD-10-CM | POA: Insufficient documentation

## 2017-08-15 ENCOUNTER — Ambulatory Visit: Payer: Medicare Other

## 2017-08-16 ENCOUNTER — Ambulatory Visit (INDEPENDENT_AMBULATORY_CARE_PROVIDER_SITE_OTHER): Payer: Medicare Other | Admitting: Family Medicine

## 2017-08-16 ENCOUNTER — Encounter: Payer: Self-pay | Admitting: Family Medicine

## 2017-08-16 VITALS — BP 132/74 | HR 66 | Temp 98.8°F | Resp 16 | Wt 198.0 lb

## 2017-08-16 DIAGNOSIS — J0100 Acute maxillary sinusitis, unspecified: Secondary | ICD-10-CM | POA: Diagnosis not present

## 2017-08-16 DIAGNOSIS — I639 Cerebral infarction, unspecified: Secondary | ICD-10-CM | POA: Diagnosis not present

## 2017-08-16 MED ORDER — HYDROCODONE-HOMATROPINE 5-1.5 MG/5ML PO SYRP: ORAL_SOLUTION | ORAL | 0 refills | Status: DC

## 2017-08-16 MED ORDER — AMOXICILLIN-POT CLAVULANATE 875-125 MG PO TABS: 1.0000 | ORAL_TABLET | Freq: Two times a day (BID) | ORAL | 0 refills | Status: DC

## 2017-08-16 NOTE — Progress Notes (Signed)
Subjective:     Patient ID: Dawn [name], female   DOB: 10/20/1933, 83 y.o.   MRN: [id number] Chief Complaint  Patient presents with  . Cough    Patient comes in office today with complaints of cough and congestion for two weeks, patient has been taking otc Tylenol and prescription cough syrup for relief.    HPI Patient reports increased sinus pressure, purulent to brown sinus drainage, post nasal drainage and accompanying productive cough  Review of Systems     Objective:   Physical Exam  Constitutional: She appears well-developed and well-nourished. No distress.  Ears: T.M's intact without inflammation Sinuses: mild maxillary sinus tenderness Throat: no tonsillar enlargement or exudate Neck: no cervical adenopathy Lungs: clear     Assessment:    1. Acute non-recurrent maxillary sinusitis - amoxicillin-clavulanate (AUGMENTIN) 875-125 MG tablet; Take 1 tablet by mouth 2 (two) times daily.  Dispense: 20 tablet; Refill: 0 - HYDROcodone-homatropine (HYCODAN) 5-1.5 MG/5ML syrup; 5 ml at bedtime for cough.  Dispense: 120 mL; Refill: 0    Plan:    Samples of Mucinex provided.

## 2017-08-16 NOTE — Patient Instructions (Signed)
If drainage is thick add Mucinex.

## 2017-09-24 ENCOUNTER — Other Ambulatory Visit: Payer: Self-pay | Admitting: Family Medicine

## 2017-09-24 NOTE — Telephone Encounter (Signed)
[facility] faxed refill request for the following medications:  simvastatin (ZOCOR) 20 MG tablet   Last Rx: 02/16/16 LOV: 08/16/17 Bob 07/02/17 Dr. Rosanna Randy Please advise. Thanks TNP

## 2017-09-25 MED ORDER — SIMVASTATIN 20 MG PO TABS: 20.0000 mg | ORAL_TABLET | Freq: Every day | ORAL | 4 refills | Status: DC

## 2017-09-25 NOTE — Telephone Encounter (Signed)
Rx sent to pharmacy   

## 2017-09-25 NOTE — Telephone Encounter (Signed)
Ok for 1 year. 

## 2017-09-27 DIAGNOSIS — N183 Chronic kidney disease, stage 3 (moderate): Secondary | ICD-10-CM | POA: Diagnosis not present

## 2017-09-27 DIAGNOSIS — N1330 Unspecified hydronephrosis: Secondary | ICD-10-CM | POA: Diagnosis not present

## 2017-09-27 DIAGNOSIS — N261 Atrophy of kidney (terminal): Secondary | ICD-10-CM | POA: Diagnosis not present

## 2017-09-27 DIAGNOSIS — I10 Essential (primary) hypertension: Secondary | ICD-10-CM | POA: Diagnosis not present

## 2017-09-27 DIAGNOSIS — R600 Localized edema: Secondary | ICD-10-CM | POA: Diagnosis not present

## 2017-10-01 DIAGNOSIS — I10 Essential (primary) hypertension: Secondary | ICD-10-CM | POA: Diagnosis not present

## 2017-10-01 DIAGNOSIS — R600 Localized edema: Secondary | ICD-10-CM | POA: Diagnosis not present

## 2017-10-01 DIAGNOSIS — N183 Chronic kidney disease, stage 3 (moderate): Secondary | ICD-10-CM | POA: Diagnosis not present

## 2017-10-01 DIAGNOSIS — N1330 Unspecified hydronephrosis: Secondary | ICD-10-CM | POA: Diagnosis not present

## 2017-11-01 ENCOUNTER — Encounter: Payer: Self-pay | Admitting: Family Medicine

## 2017-11-01 ENCOUNTER — Ambulatory Visit (INDEPENDENT_AMBULATORY_CARE_PROVIDER_SITE_OTHER): Payer: Medicare Other | Admitting: Family Medicine

## 2017-11-01 VITALS — BP 138/58 | HR 55 | Temp 97.7°F | Resp 18 | Wt 195.0 lb

## 2017-11-01 DIAGNOSIS — I10 Essential (primary) hypertension: Secondary | ICD-10-CM | POA: Diagnosis not present

## 2017-11-01 DIAGNOSIS — K219 Gastro-esophageal reflux disease without esophagitis: Secondary | ICD-10-CM

## 2017-11-01 MED ORDER — SUCRALFATE 1 G PO TABS: 1.0000 g | ORAL_TABLET | Freq: Three times a day (TID) | ORAL | 2 refills | Status: DC

## 2017-11-01 MED ORDER — PANTOPRAZOLE SODIUM 20 MG PO TBEC: 20.0000 mg | DELAYED_RELEASE_TABLET | Freq: Two times a day (BID) | ORAL | 2 refills | Status: DC

## 2017-11-01 NOTE — Patient Instructions (Signed)
Stop Prilosec and Zantac. Start Protonix and Carafate. Will recheck in 2 weeks.

## 2017-11-01 NOTE — Progress Notes (Signed)
Patient: Dawn [name] Female    DOB: 1934-02-02   84 y.o.   MRN: [id number] Visit Date: 11/01/2017  Today's Provider: Wilhemena Durie, MD   Chief Complaint  Patient presents with  . Follow-up   Subjective:    HPI Follow up:   Patient comes in today for a 4 month follow up. Patient has been having issues with reflux. She has been taking Prilosec in the mornings and Zantac at night with no relief. Her blood pressures per patient report have been stable.         Allergies  Allergen Reactions  . Quinine Anaphylaxis and Swelling  . Levofloxacin Other (See Comments)    Affects tendons  . Statins     She has tried several and has had issues with them like joint pain  . Celecoxib Rash  . Codeine Rash     Current Outpatient Medications:  .  acetaminophen (TYLENOL) 325 MG tablet, Take 650 mg by mouth every 6 (six) hours as needed for mild pain., Disp: , Rfl:  .  amoxicillin-clavulanate (AUGMENTIN) 875-125 MG tablet, Take 1 tablet by mouth 2 (two) times daily., Disp: 20 tablet, Rfl: 0 .  aspirin EC 81 MG tablet, Take 81 mg by mouth daily., Disp: , Rfl:  .  Biotin 5000 MCG CAPS, Take 5,000 mcg by mouth daily., Disp: , Rfl:  .  Calcium Carbonate-Vitamin D (CALCIUM 600+D) 600-200 MG-UNIT TABS, Take 1 tablet by mouth daily., Disp: , Rfl:  .  fexofenadine (ALLEGRA) 180 MG tablet, Take 180 mg by mouth daily., Disp: , Rfl:  .  HYDROcodone-homatropine (HYCODAN) 5-1.5 MG/5ML syrup, 5 ml at bedtime for cough., Disp: 120 mL, Rfl: 0 .  labetalol (NORMODYNE) 200 MG tablet, TAKE 1 TABLET BY MOUTH 3  TIMES DAILY, Disp: 270 tablet, Rfl: 3 .  Magnesium 250 MG TABS, Take 1 tablet by mouth daily. , Disp: , Rfl:  .  Olmesartan-Amlodipine-HCTZ 40-10-25 MG TABS, TAKE 1 TABLET BY MOUTH EVERY DAY, Disp: 90 tablet, Rfl: 3 .  omeprazole (PRILOSEC) 20 MG capsule, TAKE 1 CAPSULE BY MOUTH  DAILY, Disp: 90 capsule, Rfl: 3 .  simvastatin (ZOCOR) 20 MG tablet, Take 1 tablet (20 mg total) by mouth  daily. Reported on 11/02/2015, Disp: 90 tablet, Rfl: 4  Review of Systems  Constitutional: Negative for appetite change, chills, fatigue and fever.  HENT: Negative.   Eyes: Negative.   Respiratory: Negative for chest tightness and shortness of breath.   Cardiovascular: Negative for chest pain and palpitations.  Gastrointestinal: Negative for abdominal pain, nausea and vomiting.  Endocrine: Negative.   Allergic/Immunologic: Negative.   Neurological: Negative for dizziness and weakness.  Psychiatric/Behavioral: Negative.     Social History   Tobacco Use  . Smoking status: Never Smoker  . Smokeless tobacco: Never Used  Substance Use Topics  . Alcohol use: No   Objective:   BP (!) 138/58 (BP Location: Right Arm, Patient Position: Sitting, Cuff Size: Large)   Pulse (!) 55   Temp 97.7 F (36.5 C) (Oral)   Resp 18   Wt 195 lb (88.5 kg)   SpO2 98% Comment: room air  BMI 36.84 kg/m  There were no vitals filed for this visit.   Physical Exam  Constitutional: She is oriented to person, place, and time. She appears well-developed and well-nourished.  HENT:  Head: Normocephalic and atraumatic.  Eyes: Conjunctivae are normal. No scleral icterus.  Neck: No thyromegaly present.  Cardiovascular: Normal rate, regular  rhythm and normal heart sounds.  Pulmonary/Chest: Effort normal and breath sounds normal.  Abdominal: Soft.  Neurological: She is alert and oriented to person, place, and time.  Skin: Skin is warm and dry.  Psychiatric: She has a normal mood and affect. Her behavior is normal. Judgment and thought content normal.        Assessment & Plan:     1. Gastroesophageal reflux disease, esophagitis presence not specified Worsened. Will stop Prilosec and Zantac. Change to Protonix and Carafate. Will recheck in 2 weeks. - pantoprazole (PROTONIX) 20 MG tablet; Take 1 tablet (20 mg total) by mouth 2 (two) times daily.  Dispense: 60 tablet; Refill: 2 - sucralfate (CARAFATE) 1 g  tablet; Take 1 tablet (1 g total) by mouth 4 (four) times daily -  with meals and at bedtime.  Dispense: 120 tablet; Refill: 2  2. Hypercalcemia Stop Calcium supplement  3. Essential hypertension Stable. No changes.     I have done the exam and reviewed the above chart and it is accurate to the best of my knowledge. Development worker, community has been used in this note in any air is in the dictation or transcription are unintentional.  Wilhemena Durie, MD  [place]

## 2017-11-15 ENCOUNTER — Ambulatory Visit (INDEPENDENT_AMBULATORY_CARE_PROVIDER_SITE_OTHER): Payer: Medicare Other | Admitting: Family Medicine

## 2017-11-15 ENCOUNTER — Encounter: Payer: Self-pay | Admitting: Family Medicine

## 2017-11-15 VITALS — BP 146/60 | HR 62 | Temp 97.8°F | Resp 16 | Wt 196.0 lb

## 2017-11-15 DIAGNOSIS — E7800 Pure hypercholesterolemia, unspecified: Secondary | ICD-10-CM | POA: Diagnosis not present

## 2017-11-15 DIAGNOSIS — T148XXA Other injury of unspecified body region, initial encounter: Secondary | ICD-10-CM

## 2017-11-15 DIAGNOSIS — K219 Gastro-esophageal reflux disease without esophagitis: Secondary | ICD-10-CM

## 2017-11-15 DIAGNOSIS — I10 Essential (primary) hypertension: Secondary | ICD-10-CM

## 2017-11-15 NOTE — Patient Instructions (Signed)
Starting on April 1st decrease Carafate to 3 pills daily.

## 2017-11-15 NOTE — Progress Notes (Signed)
Patient: Dawn [name] Female    DOB: Sep 02, 1934   84 y.o.   MRN: [id number] Visit Date: 11/15/2017  Today's Provider: Wilhemena Durie, MD   Chief Complaint  Patient presents with  . Gastroesophageal Reflux   Subjective:    HPI Pt was seen 2 weeks ago for worsening GERD. Prilosec and Zantac were stopped and Protonix and Carafate were started.  Pt reports that she is feeling better. She still has some pain in her stomach. She is having a hard time taking the pills but is trying to handle them. She does not thinks he has had any side effects she has been having some pain in multiple joints but is unsure if it is the medication or other factors.      Allergies  Allergen Reactions  . Quinine Anaphylaxis and Swelling  . Levofloxacin Other (See Comments)    Affects tendons  . Statins     She has tried several and has had issues with them like joint pain  . Celecoxib Rash  . Codeine Rash     Current Outpatient Medications:  .  acetaminophen (TYLENOL) 325 MG tablet, Take 650 mg by mouth every 6 (six) hours as needed for mild pain., Disp: , Rfl:  .  aspirin EC 81 MG tablet, Take 81 mg by mouth daily., Disp: , Rfl:  .  Biotin 5000 MCG CAPS, Take 5,000 mcg by mouth daily., Disp: , Rfl:  .  fexofenadine (ALLEGRA) 180 MG tablet, Take 180 mg by mouth daily., Disp: , Rfl:  .  labetalol (NORMODYNE) 200 MG tablet, TAKE 1 TABLET BY MOUTH 3  TIMES DAILY, Disp: 270 tablet, Rfl: 3 .  Magnesium 250 MG TABS, Take 1 tablet by mouth daily. , Disp: , Rfl:  .  Olmesartan-Amlodipine-HCTZ 40-10-25 MG TABS, TAKE 1 TABLET BY MOUTH EVERY DAY, Disp: 90 tablet, Rfl: 3 .  pantoprazole (PROTONIX) 20 MG tablet, Take 1 tablet (20 mg total) by mouth 2 (two) times daily., Disp: 60 tablet, Rfl: 2 .  simvastatin (ZOCOR) 20 MG tablet, Take 1 tablet (20 mg total) by mouth daily. Reported on 11/02/2015, Disp: 90 tablet, Rfl: 4 .  sucralfate (CARAFATE) 1 g tablet, Take 1 tablet (1 g total) by mouth 4 (four)  times daily -  with meals and at bedtime., Disp: 120 tablet, Rfl: 2  Review of Systems  Constitutional: Negative.   HENT: Negative.   Eyes: Negative.   Respiratory: Negative.   Cardiovascular: Negative.   Gastrointestinal: Positive for abdominal pain.  Endocrine: Negative.   Genitourinary: Negative.   Musculoskeletal: Positive for arthralgias.  Skin: Negative.   Allergic/Immunologic: Negative.   Neurological: Negative.   Hematological: Negative.   Psychiatric/Behavioral: Negative.     Social History   Tobacco Use  . Smoking status: Never Smoker  . Smokeless tobacco: Never Used  Substance Use Topics  . Alcohol use: No   Objective:   BP (!) 146/60 (BP Location: Left Arm, Patient Position: Sitting, Cuff Size: Large)   Pulse 62   Temp 97.8 F (36.6 C) (Oral)   Resp 16   Wt 196 lb (88.9 kg)   BMI 37.03 kg/m  Vitals:   11/15/17 1502  BP: (!) 146/60  Pulse: 62  Resp: 16  Temp: 97.8 F (36.6 C)  TempSrc: Oral  Weight: 196 lb (88.9 kg)     Physical Exam  Constitutional: She is oriented to person, place, and time. She appears well-developed and well-nourished.  HENT:  Head: Normocephalic and atraumatic.  Eyes: Conjunctivae are normal.  Neck: No thyromegaly present.  Cardiovascular: Normal rate, regular rhythm and normal heart sounds.  Pulmonary/Chest: Effort normal and breath sounds normal.  Abdominal: Soft.  Neurological: She is alert and oriented to person, place, and time.  Skin: Skin is warm and dry.  Psychiatric: She has a normal mood and affect. Her behavior is normal. Judgment and thought content normal.        Assessment & Plan:     1. Essential hypertension  - CBC with Differential/Platelet - TSH  2. Gastroesophageal reflux disease, esophagitis presence not specified   3. Hypercholesteremia  - Lipid panel - Comprehensive metabolic panel  4. Bruising Probably normal. - CBC with Differential/Platelet  I have done the exam and reviewed  the chart and it is accurate to the best of my knowledge. Development worker, community has been used and  any errors in dictation or transcription are unintentional. Miguel Aschoff M.D. [place], MD  [facility]

## 2017-11-19 DIAGNOSIS — T148XXA Other injury of unspecified body region, initial encounter: Secondary | ICD-10-CM | POA: Diagnosis not present

## 2017-11-19 DIAGNOSIS — E7800 Pure hypercholesterolemia, unspecified: Secondary | ICD-10-CM | POA: Diagnosis not present

## 2017-11-19 DIAGNOSIS — I10 Essential (primary) hypertension: Secondary | ICD-10-CM | POA: Diagnosis not present

## 2017-11-20 LAB — LIPID PANEL
CHOLESTEROL TOTAL: 159 mg/dL (ref 100–199)
Chol/HDL Ratio: 2.8 ratio (ref 0.0–4.4)
HDL: 56 mg/dL (ref >39)
LDL Calculated: 70 mg/dL (ref 0–99)
Triglycerides: 165 mg/dL — ABNORMAL HIGH (ref 0–149)
VLDL CHOLESTEROL CAL: 33 mg/dL (ref 5–40)

## 2017-11-20 LAB — CBC WITH DIFFERENTIAL/PLATELET
BASOS ABS: 0.1 10*3/uL (ref 0.0–0.2)
BASOS: 1 %
EOS (ABSOLUTE): 0.3 10*3/uL (ref 0.0–0.4)
Eos: 4 %
Hematocrit: 37.8 % (ref 34.0–46.6)
Hemoglobin: 12.7 g/dL (ref 11.1–15.9)
IMMATURE GRANS (ABS): 0 10*3/uL (ref 0.0–0.1)
IMMATURE GRANULOCYTES: 0 %
LYMPHS: 23 %
Lymphocytes Absolute: 1.5 10*3/uL (ref 0.7–3.1)
MCH: 30 pg (ref 26.6–33.0)
MCHC: 33.6 g/dL (ref 31.5–35.7)
MCV: 89 fL (ref 79–97)
MONOS ABS: 0.5 10*3/uL (ref 0.1–0.9)
Monocytes: 7 %
NEUTROS ABS: 4.1 10*3/uL (ref 1.4–7.0)
NEUTROS PCT: 65 %
PLATELETS: 268 10*3/uL (ref 150–379)
RBC: 4.23 x10E6/uL (ref 3.77–5.28)
RDW: 13.7 % (ref 12.3–15.4)
WBC: 6.4 10*3/uL (ref 3.4–10.8)

## 2017-11-20 LAB — COMPREHENSIVE METABOLIC PANEL
ALBUMIN: 4.3 g/dL (ref 3.5–4.7)
ALT: 12 IU/L (ref 0–32)
AST: 12 IU/L (ref 0–40)
Albumin/Globulin Ratio: 2 (ref 1.2–2.2)
Alkaline Phosphatase: 73 IU/L (ref 39–117)
BUN / CREAT RATIO: 17 (ref 12–28)
BUN: 24 mg/dL (ref 8–27)
Bilirubin Total: 0.4 mg/dL (ref 0.0–1.2)
CALCIUM: 10.1 mg/dL (ref 8.7–10.3)
CO2: 25 mmol/L (ref 20–29)
CREATININE: 1.43 mg/dL — AB (ref 0.57–1.00)
Chloride: 97 mmol/L (ref 96–106)
GFR, EST AFRICAN AMERICAN: 40 mL/min/{1.73_m2} — AB (ref >59)
GFR, EST NON AFRICAN AMERICAN: 34 mL/min/{1.73_m2} — AB (ref >59)
GLUCOSE: 86 mg/dL (ref 65–99)
Globulin, Total: 2.1 g/dL (ref 1.5–4.5)
Potassium: 4 mmol/L (ref 3.5–5.2)
Sodium: 138 mmol/L (ref 134–144)
TOTAL PROTEIN: 6.4 g/dL (ref 6.0–8.5)

## 2017-11-20 LAB — TSH: TSH: 2.66 u[IU]/mL (ref 0.450–4.500)

## 2017-11-23 NOTE — Progress Notes (Signed)
Advised  ED 

## 2017-12-13 DIAGNOSIS — H6121 Impacted cerumen, right ear: Secondary | ICD-10-CM | POA: Diagnosis not present

## 2017-12-25 ENCOUNTER — Other Ambulatory Visit: Payer: Self-pay | Admitting: Family Medicine

## 2017-12-25 DIAGNOSIS — H43812 Vitreous degeneration, left eye: Secondary | ICD-10-CM | POA: Diagnosis not present

## 2017-12-26 DIAGNOSIS — E7800 Pure hypercholesterolemia, unspecified: Secondary | ICD-10-CM | POA: Diagnosis not present

## 2017-12-26 DIAGNOSIS — R0602 Shortness of breath: Secondary | ICD-10-CM | POA: Diagnosis not present

## 2017-12-26 DIAGNOSIS — I10 Essential (primary) hypertension: Secondary | ICD-10-CM | POA: Diagnosis not present

## 2017-12-26 DIAGNOSIS — R7303 Prediabetes: Secondary | ICD-10-CM | POA: Diagnosis not present

## 2017-12-26 DIAGNOSIS — I6523 Occlusion and stenosis of bilateral carotid arteries: Secondary | ICD-10-CM | POA: Diagnosis not present

## 2018-01-17 DIAGNOSIS — N261 Atrophy of kidney (terminal): Secondary | ICD-10-CM | POA: Diagnosis not present

## 2018-01-17 DIAGNOSIS — N189 Chronic kidney disease, unspecified: Secondary | ICD-10-CM | POA: Diagnosis not present

## 2018-01-17 DIAGNOSIS — N1330 Unspecified hydronephrosis: Secondary | ICD-10-CM | POA: Diagnosis not present

## 2018-01-17 DIAGNOSIS — I10 Essential (primary) hypertension: Secondary | ICD-10-CM | POA: Diagnosis not present

## 2018-01-17 DIAGNOSIS — R600 Localized edema: Secondary | ICD-10-CM | POA: Diagnosis not present

## 2018-01-22 DIAGNOSIS — N261 Atrophy of kidney (terminal): Secondary | ICD-10-CM | POA: Diagnosis not present

## 2018-01-22 DIAGNOSIS — N183 Chronic kidney disease, stage 3 (moderate): Secondary | ICD-10-CM | POA: Diagnosis not present

## 2018-01-22 DIAGNOSIS — N1330 Unspecified hydronephrosis: Secondary | ICD-10-CM | POA: Diagnosis not present

## 2018-01-22 DIAGNOSIS — I10 Essential (primary) hypertension: Secondary | ICD-10-CM | POA: Diagnosis not present

## 2018-01-22 DIAGNOSIS — R600 Localized edema: Secondary | ICD-10-CM | POA: Diagnosis not present

## 2018-01-24 ENCOUNTER — Ambulatory Visit (INDEPENDENT_AMBULATORY_CARE_PROVIDER_SITE_OTHER): Payer: Medicare Other | Admitting: Family Medicine

## 2018-01-24 VITALS — BP 164/50 | HR 72 | Temp 98.0°F | Resp 16 | Wt 195.0 lb

## 2018-01-24 DIAGNOSIS — N185 Chronic kidney disease, stage 5: Secondary | ICD-10-CM

## 2018-01-24 DIAGNOSIS — I120 Hypertensive chronic kidney disease with stage 5 chronic kidney disease or end stage renal disease: Secondary | ICD-10-CM

## 2018-01-24 DIAGNOSIS — I10 Essential (primary) hypertension: Secondary | ICD-10-CM | POA: Diagnosis not present

## 2018-01-24 DIAGNOSIS — K219 Gastro-esophageal reflux disease without esophagitis: Secondary | ICD-10-CM | POA: Diagnosis not present

## 2018-01-24 NOTE — Progress Notes (Signed)
Dawn [name]  MRN: [id number] DOB: [birth year]  Subjective:  HPI   The patient is an 84 year old female who presents today for follow up of her acid reflux.  She was last seen on 11/01/17 for this and was started on Protonix and Sucrafate.  She reports good compliance and tolerance of the medicine.  She states she sees minimal improvement in her symptoms.  She is having trouble finding anything to eat that doesn't bother her stomach.   She has no RUQ pain/nausea. Patient Active Problem List   Diagnosis Date Noted  . Hypertension 07/02/2017  . Carotid artery narrowing 08/09/2015  . Bilateral carotid artery stenosis 08/09/2015  . Syncope and collapse 07/30/2015  . Allergic rhinitis 01/05/2015  . Cerebral infarct ([place]) 01/05/2015  . Accelerated essential hypertension 01/05/2015  . Benign gastric polyp 01/05/2015  . Hypercholesteremia 01/05/2015  . Neuropathy 01/05/2015  . Obesity 01/05/2015  . Arthritis of knee, degenerative 01/05/2015  . Borderline diabetes 01/05/2015  . Temporary cerebral vascular dysfunction 01/05/2015  . GERD (gastroesophageal reflux disease) 03/31/2013  . Gastric polyp     Past Medical History:  Diagnosis Date  . Acid reflux   . Arthritis   . Back pain   . Gastric polyp 2014  . Hypertension   . Stomach ulcer   . Stroke [facility])     Social History   Socioeconomic History  . Marital status: Divorced    Spouse name: none  . Number of children: 1  . Years of education: 74  . Highest education level: Not on file  Occupational History  . Occupation: retired  Scientific laboratory technician  . Financial resource strain: Not on file  . Food insecurity:    Worry: Not on file    Inability: Not on file  . Transportation needs:    Medical: Not on file    Non-medical: Not on file  Tobacco Use  . Smoking status: Never Smoker  . Smokeless tobacco: Never Used  Substance and Sexual Activity  . Alcohol use: No  . Drug use: No  . Sexual activity: Never  Lifestyle  .  Physical activity:    Days per week: Not on file    Minutes per session: Not on file  . Stress: Not on file  Relationships  . Social connections:    Talks on phone: Not on file    Gets together: Not on file    Attends religious service: Not on file    Active member of club or organization: Not on file    Attends meetings of clubs or organizations: Not on file    Relationship status: Not on file  . Intimate partner violence:    Fear of current or ex partner: Not on file    Emotionally abused: Not on file    Physically abused: Not on file    Forced sexual activity: Not on file  Other Topics Concern  . Not on file  Social History Narrative  . Not on file    Outpatient Encounter Medications as of 01/24/2018  Medication Sig  . acetaminophen (TYLENOL) 325 MG tablet Take 650 mg by mouth every 6 (six) hours as needed for mild pain.  [name] aspirin EC 81 MG tablet Take 81 mg by mouth daily.  . Biotin 5000 MCG CAPS Take 5,000 mcg by mouth daily.  . fexofenadine (ALLEGRA) 180 MG tablet Take 180 mg by mouth daily.  [name] labetalol (NORMODYNE) 200 MG tablet TAKE 1 TABLET BY MOUTH 3  TIMES  DAILY  . Magnesium 250 MG TABS Take 1 tablet by mouth daily.   . Olmesartan-amLODIPine-HCTZ 40-10-25 MG TABS TAKE 1 TABLET BY MOUTH EVERY DAY  . pantoprazole (PROTONIX) 20 MG tablet Take 1 tablet (20 mg total) by mouth 2 (two) times daily.  . simvastatin (ZOCOR) 20 MG tablet Take 1 tablet (20 mg total) by mouth daily. Reported on 11/02/2015  . sucralfate (CARAFATE) 1 g tablet Take 1 tablet (1 g total) by mouth 4 (four) times daily -  with meals and at bedtime.   No facility-administered encounter medications on file as of 01/24/2018.     Allergies  Allergen Reactions  . Quinine Anaphylaxis and Swelling  . Levofloxacin Other (See Comments)    Affects tendons  . Statins     She has tried several and has had issues with them like joint pain  . Celecoxib Rash  . Codeine Rash    Review of Systems    Constitutional: Negative for fever and malaise/fatigue.  HENT: Negative.   Eyes: Negative.   Respiratory: Negative for cough, shortness of breath and wheezing.   Cardiovascular: Positive for leg swelling. Negative for chest pain, palpitations, orthopnea and claudication.  Gastrointestinal: Negative for abdominal pain, blood in stool, constipation, diarrhea, heartburn, nausea and vomiting.       Epigastric burning  Musculoskeletal: Positive for back pain.  Skin: Negative.   Neurological: Negative.   Endo/Heme/Allergies: Negative.     Objective:  BP (!) 164/50 (BP Location: Right Arm, Patient Position: Sitting, Cuff Size: Normal)   Pulse 72   Temp 98 F (36.7 C) (Oral)   Resp 16   Wt 195 lb (88.5 kg)   BMI 36.84 kg/m   Physical Exam  Constitutional: She is oriented to person, place, and time and well-developed, well-nourished, and in no distress.  HENT:  Head: Normocephalic and atraumatic.  Eyes: Conjunctivae are normal.  Neck: Neck supple.  Cardiovascular: Normal rate, regular rhythm and normal heart sounds.  Pulmonary/Chest: Effort normal and breath sounds normal.  Abdominal: Soft.  Neurological: She is alert and oriented to person, place, and time. Gait normal. GCS score is 15.  Skin: Skin is warm and dry.  Psychiatric: Mood, memory, affect and judgment normal.    Assessment and Plan :  GERD/Gastritis Refer to GI. HTN CKD  I have done the exam and reviewed the chart and it is accurate to the best of my knowledge. Development worker, community has been used and  any errors in dictation or transcription are unintentional. Miguel Aschoff M.D. [facility]

## 2018-03-05 ENCOUNTER — Ambulatory Visit: Payer: Medicare Other | Admitting: Gastroenterology

## 2018-03-25 ENCOUNTER — Ambulatory Visit (INDEPENDENT_AMBULATORY_CARE_PROVIDER_SITE_OTHER): Payer: Medicare Other | Admitting: Urology

## 2018-03-25 ENCOUNTER — Encounter: Payer: Self-pay | Admitting: Urology

## 2018-03-25 VITALS — BP 136/71 | HR 67 | Ht 61.0 in | Wt 186.0 lb

## 2018-03-25 DIAGNOSIS — R32 Unspecified urinary incontinence: Secondary | ICD-10-CM | POA: Diagnosis not present

## 2018-03-25 DIAGNOSIS — N3946 Mixed incontinence: Secondary | ICD-10-CM | POA: Diagnosis not present

## 2018-03-25 LAB — URINALYSIS, COMPLETE
Bilirubin, UA: NEGATIVE
Glucose, UA: NEGATIVE
KETONES UA: NEGATIVE
LEUKOCYTES UA: NEGATIVE
NITRITE UA: NEGATIVE
Protein, UA: NEGATIVE
RBC UA: NEGATIVE
SPEC GRAV UA: 1.015 (ref 1.005–1.030)
Urobilinogen, Ur: 0.2 mg/dL (ref 0.2–1.0)
pH, UA: 6.5 (ref 5.0–7.5)

## 2018-03-25 LAB — BLADDER SCAN AMB NON-IMAGING

## 2018-03-25 MED ORDER — MIRABEGRON ER 50 MG PO TB24: 50.0000 mg | ORAL_TABLET | Freq: Every day | ORAL | 11 refills | Status: DC

## 2018-03-25 NOTE — Addendum Note (Signed)
Addended by: Tommy Rainwater on: 03/25/2018 11:03 AM   Modules accepted: Orders

## 2018-03-25 NOTE — Progress Notes (Signed)
03/25/2018 10:31 AM   Dawn [name] Reece Leader 28-Aug-1934 [id number]  Referring provider: Jerrol Banana., MD [address] [place], [place] [address]  Chief Complaint  Patient presents with  . Urinary Incontinence    New patient    HPI: I was consulted to assist the patient is urinary incontinence worsening over many years.  She leaks with coughing sneezing and sometimes bending and lifting.  She does not report a lot of urge incontinence but then she says when she goes from a sitting to standing position the urine runs down her leg.  She has moderately severe bedwetting.  She wears 4-6 pads a day that can be quite wet.  She voids at least every 1 hour and would have difficulty holding it for 2 hours.  She gets up 2-3 times a night to urinate.  She has been told she has a poorly functioning kidney with chronic renal insufficiency.  CT scan approximately 1 year ago demonstrated likely a chronic left ureteropelvic junction obstruction with mild dilation of the collecting system.  In March 2019 her serum creatinine was 1.43  She has had a stroke and to lower back operations  She denies a history of kidney stones previous GU surgery and bladder infections.  She has normal bowel movements.  She has not had a hysterectomy.  The presentation is not been medically treated  Modifying factors: There are no other modifying factors  Associated signs and symptoms: There are no other associated signs and symptoms Aggravating and relieving factors: There are no other aggravating or relieving factors Severity: Moderate Duration: Persistent   PMH: Past Medical History:  Diagnosis Date  . Acid reflux   . Arthritis   . Back pain   . Gastric polyp 2014  . Hypertension   . Stomach ulcer   . Stroke [facility])     Surgical History: Past Surgical History:  Procedure Laterality Date  . BACK SURGERY  2011   vertebroplasty   . CARDIAC CATHETERIZATION  2007   normal, [employer] [place]   . CARPAL TUNNEL RELEASE  2007  . cataract surgery   2013  . COLON SURGERY  2015   lap sigmoid resection  . COLONOSCOPY  2011, 2015   Dr. Nicolasa Ducking, Dr Melody Comas  . LAMINECTOMY  2011   decompression   . UPPER GI ENDOSCOPY  2011  . VASCULAR SURGERY Right 2010   right rental artery     Home Medications:  Allergies as of 03/25/2018      Reactions   Quinine Anaphylaxis, Swelling   Levofloxacin Other (See Comments)   Affects tendons   Statins    She has tried several and has had issues with them like joint pain   Celecoxib Rash   Codeine Rash      Medication List        Accurate as of 03/25/18 10:31 AM. Always use your most recent med list.          acetaminophen 325 MG tablet Commonly known as:  TYLENOL Take 650 mg by mouth every 6 (six) hours as needed for mild pain.   aspirin EC 81 MG tablet Take 81 mg by mouth daily.   Biotin 5000 MCG Caps Take 5,000 mcg by mouth daily.   fexofenadine 180 MG tablet Commonly known as:  ALLEGRA Take 180 mg by mouth daily.   labetalol 200 MG tablet Commonly known as:  NORMODYNE TAKE 1 TABLET BY MOUTH 3  TIMES DAILY  Magnesium 250 MG Tabs Take 1 tablet by mouth daily.   Olmesartan-amLODIPine-HCTZ 40-10-25 MG Tabs TAKE 1 TABLET BY MOUTH EVERY DAY   pantoprazole 20 MG tablet Commonly known as:  PROTONIX Take 1 tablet (20 mg total) by mouth 2 (two) times daily.   simvastatin 20 MG tablet Commonly known as:  ZOCOR Take 1 tablet (20 mg total) by mouth daily. Reported on 11/02/2015   sucralfate 1 g tablet Commonly known as:  CARAFATE Take 1 tablet (1 g total) by mouth 4 (four) times daily -  with meals and at bedtime.   torsemide 5 MG tablet Commonly known as:  DEMADEX Take 5 mg by mouth daily.       Allergies:  Allergies  Allergen Reactions  . Quinine Anaphylaxis and Swelling  . Levofloxacin Other (See Comments)    Affects tendons  . Statins     She has tried several and has had issues with them like joint  pain  . Celecoxib Rash  . Codeine Rash    Family History: Family History  Problem Relation Age of Onset  . Cancer Father        stomach  . Emphysema Father   . Hypertension Mother   . Atrial fibrillation Mother   . Diabetes Sister   . Hypertension Sister   . Skin cancer Sister   . Hypertension Brother   . Heart disease Brother   . Heart disease Sister   . Colon polyps Sister   . Hypertension Sister   . Thyroid disease Sister   . Irritable bowel syndrome Sister   . Heart disease Brother   . Colon polyps Brother   . Diabetes Brother   . Hypertension Brother   . Neuropathy Brother   . Post-traumatic stress disorder Brother   . Hypertension Brother   . Breast cancer Paternal Aunt   . Breast cancer Paternal Grandmother   . Breast cancer Paternal Aunt   . Breast cancer Paternal Aunt   . Breast cancer Paternal Aunt     Social History:  reports that she has never smoked. She has never used smokeless tobacco. She reports that she does not drink alcohol or use drugs.  ROS: UROLOGY Frequent Urination?: No Hard to postpone urination?: No Burning/pain with urination?: No Get up at night to urinate?: Yes Leakage of urine?: Yes Urine stream starts and stops?: No Trouble starting stream?: No Do you have to strain to urinate?: No Blood in urine?: No Urinary tract infection?: No Sexually transmitted disease?: No Injury to kidneys or bladder?: No Painful intercourse?: No Weak stream?: No Currently pregnant?: No Vaginal bleeding?: No Last menstrual period?: n  Gastrointestinal Nausea?: No Vomiting?: No Indigestion/heartburn?: Yes Diarrhea?: No Constipation?: No  Constitutional Fever: No Night sweats?: No Weight loss?: No Fatigue?: No  Skin Skin rash/lesions?: No Itching?: No  Eyes Blurred vision?: No Double vision?: No  Ears/Nose/Throat Sore throat?: No Sinus problems?: No  Hematologic/Lymphatic Swollen glands?: No Easy bruising?:  No  Cardiovascular Leg swelling?: No Chest pain?: No  Respiratory Cough?: Yes Shortness of breath?: Yes  Endocrine Excessive thirst?: No  Musculoskeletal Back pain?: Yes Joint pain?: Yes  Neurological Headaches?: Yes Dizziness?: No  Psychologic Depression?: No Anxiety?: No  Physical Exam: BP 136/71   Pulse 67   Ht 5\' 1"  (1.549 m)   Wt 186 lb (84.4 kg)   BMI 35.14 kg/m   Constitutional:  Alert and oriented, No acute distress. HEENT: [place] AT, moist mucus membranes.  Trachea midline, no masses. Cardiovascular:  No clubbing, cyanosis, or edema. Respiratory: Normal respiratory effort, no increased work of breathing. GI: Abdomen is soft, nontender, nondistended, no abdominal masses GU: Mild grade 2 hyper mobility the bladder neck and a modest positive cough test with no prolapse Skin: No rashes, bruises or suspicious lesions. Lymph: No cervical or inguinal adenopathy. Neurologic: Grossly intact, no focal deficits, moving all 4 extremities. Psychiatric: Normal mood and affect.  Laboratory Data: Lab Results  Component Value Date   WBC 6.4 11/19/2017   HGB 12.7 11/19/2017   HCT 37.8 11/19/2017   MCV 89 11/19/2017   PLT 268 11/19/2017    Lab Results  Component Value Date   CREATININE 1.43 (H) 11/19/2017    No results found for: PSA  No results found for: TESTOSTERONE  Lab Results  Component Value Date   HGBA1C 5.4 09/21/2016    Urinalysis    Component Value Date/Time   COLORURINE AMBER (A) 02/13/2017 1153   APPEARANCEUR HAZY (A) 02/13/2017 1153   APPEARANCEUR Clear 12/10/2013 1142   LABSPEC 1.019 02/13/2017 1153   LABSPEC 1.030 10/13/2013 2057   PHURINE 5.0 02/13/2017 1153   GLUCOSEU NEGATIVE 02/13/2017 1153   GLUCOSEU Negative 10/13/2013 2057   HGBUR NEGATIVE 02/13/2017 1153   BILIRUBINUR NEGATIVE 02/13/2017 1153   BILIRUBINUR neg 09/20/2016 1442   BILIRUBINUR Negative 12/10/2013 1142   BILIRUBINUR Negative 10/13/2013 2057   KETONESUR 5 (A)  02/13/2017 1153   PROTEINUR 100 (A) 02/13/2017 1153   UROBILINOGEN 0.2 09/20/2016 1442   NITRITE NEGATIVE 02/13/2017 1153   LEUKOCYTESUR SMALL (A) 02/13/2017 1153   LEUKOCYTESUR Negative 12/10/2013 1142   LEUKOCYTESUR Negative 10/13/2013 2057    Pertinent Imaging:   Assessment & Plan: Patient has mixed incontinence.  She has bedwetting.  I believe she is triggering bladder overactivity when she goes from a sitting to standing position.  She has significant hourly frequency.  She is mildly nocturia.  She has renal insufficiency.  I sent the urine for culture.  The patient did say at times a stress incontinence is significant.  Based upon the pelvic examination her age and the mixed picture I urethral injectable would be very reasonable to consider eventually for stress incontinence.  At this stage I will hold off on urodynamics but would order such prior to a sling.  Clinically I felt she mainly had an overactive bladder.  Reassess in 5 weeks on Myrbetriq 50 mg samples and prescription  There are no diagnoses linked to this encounter.  No follow-ups on file.  Reece Packer, MD  [facility] [address] [place], [place] [address] [phone]

## 2018-03-27 LAB — CULTURE, URINE COMPREHENSIVE

## 2018-04-15 ENCOUNTER — Ambulatory Visit (INDEPENDENT_AMBULATORY_CARE_PROVIDER_SITE_OTHER): Payer: Medicare Other | Admitting: Gastroenterology

## 2018-04-15 ENCOUNTER — Encounter

## 2018-04-15 ENCOUNTER — Encounter: Payer: Self-pay | Admitting: Gastroenterology

## 2018-04-15 DIAGNOSIS — K219 Gastro-esophageal reflux disease without esophagitis: Secondary | ICD-10-CM

## 2018-04-15 MED ORDER — PANTOPRAZOLE SODIUM 40 MG PO TBEC: 40.0000 mg | DELAYED_RELEASE_TABLET | Freq: Every day | ORAL | 3 refills | Status: DC

## 2018-04-15 NOTE — Progress Notes (Signed)
Jonathon Bellows MD, MRCP(U.K) [address]  [place]  [place], [place] [address]  Main: [phone]  Fax: [phone]   Gastroenterology Consultation  Referring Provider:     Jerrol Banana.,* Primary Care Physician:  Jerrol Banana., MD Primary Gastroenterologist:  Dr. Jonathon Bellows  Reason for Consultation:     GERD        HPI:   Dawn [name] is a 84 y.o. y/o female referred for consultation & management  by Dr. Rosanna Randy, Retia Passe., MD.    She says that "everything I eat burns from the front to the back" ongoing for a few years, about 4 months back , Dr Rosanna Randy gave her some protonix to try and she is unsure if it helps. Prilosec previously taken after meals did not work. She also took the protonix after meals.   No change of weight . Says she no new issues with swallowing.   She says she has had an EGD- many years back and was told she had no abnormalities.    CBC Latest Ref Rng & Units 11/19/2017 02/13/2017 09/21/2016  WBC 3.4 - 10.8 x10E3/uL 6.4 6.6 6.2  Hemoglobin 11.1 - 15.9 g/dL 12.7 13.5 11.8  Hematocrit 34.0 - 46.6 % 37.8 37.9 35.5  Platelets 150 - 379 x10E3/uL 268 232 269     Past Medical History:  Diagnosis Date  . Acid reflux   . Arthritis   . Back pain   . Gastric polyp 2014  . Hypertension   . Stomach ulcer   . Stroke [facility])     Past Surgical History:  Procedure Laterality Date  . BACK SURGERY  2011   vertebroplasty   . CARDIAC CATHETERIZATION  2007   normal, [employer] [place]   . CARPAL TUNNEL RELEASE  2007  . cataract surgery   2013  . COLON SURGERY  2015   lap sigmoid resection  . COLONOSCOPY  2011, 2015   Dr. Nicolasa Ducking, Dr Melody Comas  . LAMINECTOMY  2011   decompression   . UPPER GI ENDOSCOPY  2011  . VASCULAR SURGERY Right 2010   right rental artery     Prior to Admission medications   Medication Sig Start Date End Date Taking? Authorizing Provider  acetaminophen (TYLENOL) 325 MG tablet Take 650 mg by mouth  every 6 (six) hours as needed for mild pain.    [provider]  aspirin EC 81 MG tablet Take 81 mg by mouth daily.    [provider]  Biotin 5000 MCG CAPS Take 5,000 mcg by mouth daily.    [provider]  fexofenadine (ALLEGRA) 180 MG tablet Take 180 mg by mouth daily.    [provider]  labetalol (NORMODYNE) 200 MG tablet TAKE 1 TABLET BY MOUTH 3  TIMES DAILY 03/29/17   Jerrol Banana., MD  Magnesium 250 MG TABS Take 1 tablet by mouth daily.     [provider]  mirabegron ER (MYRBETRIQ) 50 MG TB24 tablet Take 1 tablet (50 mg total) by mouth daily. 03/25/18   Bjorn Loser, MD  Olmesartan-amLODIPine-HCTZ 40-10-25 MG TABS TAKE 1 TABLET BY MOUTH EVERY DAY 12/25/17   Jerrol Banana., MD  pantoprazole (PROTONIX) 20 MG tablet Take 1 tablet (20 mg total) by mouth 2 (two) times daily. 11/01/17   Jerrol Banana., MD  simvastatin (ZOCOR) 20 MG tablet Take 1 tablet (20 mg total) by mouth daily. Reported on 11/02/2015 09/25/17  Jerrol Banana., MD  sucralfate (CARAFATE) 1 g tablet Take 1 tablet (1 g total) by mouth 4 (four) times daily -  with meals and at bedtime. 11/01/17   Jerrol Banana., MD  torsemide (DEMADEX) 5 MG tablet Take 5 mg by mouth daily.    [provider]    Family History  Problem Relation Age of Onset  . Cancer Father        stomach  . Emphysema Father   . Hypertension Mother   . Atrial fibrillation Mother   . Diabetes Sister   . Hypertension Sister   . Skin cancer Sister   . Hypertension Brother   . Heart disease Brother   . Heart disease Sister   . Colon polyps Sister   . Hypertension Sister   . Thyroid disease Sister   . Irritable bowel syndrome Sister   . Heart disease Brother   . Colon polyps Brother   . Diabetes Brother   . Hypertension Brother   . Neuropathy Brother   . Post-traumatic stress disorder Brother   . Hypertension Brother   . Breast cancer Paternal Aunt   .  Breast cancer Paternal Grandmother   . Breast cancer Paternal Aunt   . Breast cancer Paternal Aunt   . Breast cancer Paternal Aunt      Social History   Tobacco Use  . Smoking status: Never Smoker  . Smokeless tobacco: Never Used  Substance Use Topics  . Alcohol use: No  . Drug use: No    Allergies as of 04/15/2018 - Review Complete 03/25/2018  Allergen Reaction Noted  . Quinine Anaphylaxis and Swelling 10/23/2013  . Levofloxacin Other (See Comments)   . Statins  10/19/2015  . Celecoxib Rash 10/23/2013  . Codeine Rash 10/23/2013    Review of Systems:    All systems reviewed and negative except where noted in HPI.   Physical Exam:  There were no vitals taken for this visit. No LMP recorded. Patient is postmenopausal. Psych:  Alert and cooperative. Normal mood and affect. General:   Alert,  Well-developed, well-nourished, pleasant and cooperative in NAD Head:  Normocephalic and atraumatic. Eyes:  Sclera clear, no icterus.   Conjunctiva pink. Ears:  Normal auditory acuity. Nose:  No deformity, discharge, or lesions. Mouth:  No deformity or lesions,oropharynx pink & moist. Neck:  Supple; no masses or thyromegaly. Lungs:  Respirations even and unlabored.  Clear throughout to auscultation.   No wheezes, crackles, or rhonchi. No acute distress. Heart:  Regular rate and rhythm; no murmurs, clicks, rubs, or gallops. Abdomen:  Normal bowel sounds.  No bruits.  Soft, non-tender and non-distended without masses, hepatosplenomegaly or hernias noted.  No guarding or rebound tenderness.    Neurologic:  Alert and oriented x3;  grossly normal neurologically. Skin:  Intact without significant lesions or rashes. No jaundice. Lymph Nodes:  No significant cervical adenopathy. Psych:  Alert and cooperative. Normal mood and affect.  Imaging Studies: No results found.  Assessment and Plan:   Dawn [name] is a 84 y.o. y/o female has been referred for GERD. She has symptoms despite use  of a PPI, unfortunately she has been taking her PPI after her meals rather than before which makes it ineffective.   Plan  1. Start on PPI before meals, refilled 2. GERD patient information provided, discussed life style changes 3. If no better in 4 weeks will need EGD. She is aware.     Follow up in 4 weeks  Dr Jonathon Bellows MD,MRCP(U.K)

## 2018-05-06 ENCOUNTER — Ambulatory Visit: Payer: Medicare Other | Admitting: Urology

## 2018-05-21 ENCOUNTER — Encounter: Payer: Self-pay | Admitting: Gastroenterology

## 2018-05-21 ENCOUNTER — Ambulatory Visit (INDEPENDENT_AMBULATORY_CARE_PROVIDER_SITE_OTHER): Payer: Medicare Other | Admitting: Gastroenterology

## 2018-05-21 VITALS — BP 152/75 | HR 59 | Ht 61.0 in | Wt 194.0 lb

## 2018-05-21 DIAGNOSIS — K219 Gastro-esophageal reflux disease without esophagitis: Secondary | ICD-10-CM | POA: Diagnosis not present

## 2018-05-21 NOTE — Patient Instructions (Signed)

## 2018-05-21 NOTE — Progress Notes (Signed)
Jonathon Bellows MD, MRCP(U.K) [address]  [place]  [place], [place] [address]  Main: [phone]  Fax: [phone]   Primary Care Physician: Jerrol Banana., MD  Primary Gastroenterologist:  Dr. Jonathon Bellows   No chief complaint on file.   HPI: Dawn [name] is a 84 y.o. female   Summary of history :  She was initially seen been referred on 04/15/2018 for GERD.  She was symptomatic despite the use of PPI, she had been taking her PPI after meals at that point and I felt that was rendering it ineffective.  At her last visit we decided she would take it before meals for a short period of time as a trial and return back to my office to see if it made her symptoms any better.  When she presented to my office initially in 04/30/2018 she said that everything she ate because did experience a burning sensation from the front to the back which have been ongoing for a few years.  Worse for the last 4 months.  She was commenced on Protonix but is unsure if it helped as she had previously taken it after meals.  There was no change of weight, no issues with swallowing.  She had an EGD many years back and was told it was normal.  In March 2019 her hemoglobin was 12.7 g.  Interval history 04/15/2018 to 05/21/2018.   Doing well no issues. Symptoms she had last time have resolved. Takes PPI in the morning . Reviewed life style changes again for GERD.     Current Outpatient Medications  Medication Sig Dispense Refill  . acetaminophen (TYLENOL) 325 MG tablet Take 650 mg by mouth every 6 (six) hours as needed for mild pain.    [name] aspirin EC 81 MG tablet Take 81 mg by mouth daily.    . Biotin 5000 MCG CAPS Take 5,000 mcg by mouth daily.    . fexofenadine (ALLEGRA) 180 MG tablet Take 180 mg by mouth daily.    [name] labetalol (NORMODYNE) 200 MG tablet TAKE 1 TABLET BY MOUTH 3  TIMES DAILY 270 tablet 3  . Magnesium 250 MG TABS Take 1 tablet by mouth daily.     . mirabegron ER (MYRBETRIQ) 50 MG  TB24 tablet Take 1 tablet (50 mg total) by mouth daily. 30 tablet 11  . Olmesartan-amLODIPine-HCTZ 40-10-25 MG TABS TAKE 1 TABLET BY MOUTH EVERY DAY 90 tablet 3  . pantoprazole (PROTONIX) 40 MG tablet Take 1 tablet (40 mg total) by mouth daily. 90 tablet 3  . simvastatin (ZOCOR) 20 MG tablet Take 1 tablet (20 mg total) by mouth daily. Reported on 11/02/2015 90 tablet 4  . sucralfate (CARAFATE) 1 g tablet Take 1 tablet (1 g total) by mouth 4 (four) times daily -  with meals and at bedtime. 120 tablet 2  . torsemide (DEMADEX) 5 MG tablet Take 5 mg by mouth daily.     No current facility-administered medications for this visit.     Allergies as of 05/21/2018 - Review Complete 04/15/2018  Allergen Reaction Noted  . Quinine Anaphylaxis and Swelling 10/23/2013  . Levofloxacin Other (See Comments)   . Statins  10/19/2015  . Celecoxib Rash 10/23/2013  . Codeine Rash 10/23/2013    ROS:  General: Negative for anorexia, weight loss, fever, chills, fatigue, weakness. ENT: Negative for hoarseness, difficulty swallowing , nasal congestion. CV: Negative for chest pain, angina, palpitations, dyspnea on exertion, peripheral edema.  Respiratory: Negative for dyspnea at  rest, dyspnea on exertion, cough, sputum, wheezing.  GI: See history of present illness. GU:  Negative for dysuria, hematuria, urinary incontinence, urinary frequency, nocturnal urination.  Endo: Negative for unusual weight change.    Physical Examination:   There were no vitals taken for this visit.  General: Well-nourished, well-developed in no acute distress.  Eyes: No icterus. Conjunctivae pink. Mouth: Oropharyngeal mucosa moist and pink , no lesions erythema or exudate. Lungs: Clear to auscultation bilaterally. Non-labored. Heart: Regular rate and rhythm, no murmurs rubs or gallops.  Abdomen: Bowel sounds are normal, nontender, nondistended, no hepatosplenomegaly or masses, no abdominal bruits or hernia , no rebound or  guarding.   Extremities: No lower extremity edema. No clubbing or deformities. Neuro: Alert and oriented x 3.  Grossly intact. Skin: Warm and dry, no jaundice.   Psych: Alert and cooperative, normal mood and affect.   Imaging Studies: No results found.  Assessment and Plan:   Dawn [name] is a 84 y.o. y/o female here to see me today as a follow-up for acid reflux.  She presented in 04/30/2018 with symptoms suggestive of reflux.  She was taking her PPI and currently after meals rather than before meals rendering it ineffective.  After a trial of 4 weeks of taking PPI before meals she says her symptoms have resolved. In 4 months if doing well will try to decrease dose of PPI   Plan 1.  Patient information for GERD.   Dr Jonathon Bellows  MD,MRCP [facility] Follow up in 3-4 months

## 2018-06-03 ENCOUNTER — Ambulatory Visit (INDEPENDENT_AMBULATORY_CARE_PROVIDER_SITE_OTHER): Payer: Medicare Other | Admitting: Urology

## 2018-06-03 ENCOUNTER — Encounter: Payer: Self-pay | Admitting: Urology

## 2018-06-03 VITALS — BP 105/61 | HR 60 | Ht 61.0 in | Wt 193.0 lb

## 2018-06-03 DIAGNOSIS — N3946 Mixed incontinence: Secondary | ICD-10-CM

## 2018-06-03 MED ORDER — OXYBUTYNIN CHLORIDE ER 10 MG PO TB24: 10.0000 mg | ORAL_TABLET | Freq: Every day | ORAL | 11 refills | Status: DC

## 2018-06-03 MED ORDER — TOLTERODINE TARTRATE ER 4 MG PO CP24: 4.0000 mg | ORAL_CAPSULE | Freq: Every day | ORAL | 11 refills | Status: DC

## 2018-06-03 NOTE — Progress Notes (Signed)
06/03/2018 3:27 PM   Dawn [name] Dawn [name] Mar 07, 1934 [id number]  Referring provider: Jerrol Banana., MD [address] [place], [place] [address]  Chief Complaint  Patient presents with  . Urinary Incontinence    5wk    HPI: I was consulted to assist the patient is urinary incontinence worsening over many years.  She leaks with coughing sneezing and sometimes bending and lifting.  She does not report a lot of urge incontinence but then she says when she goes from a sitting to standing position the urine runs down her leg.  She has moderately severe bedwetting.  She wears 4-6 pads a day that can be quite wet.  She voids at least every 1 hour and would have difficulty holding it for 2 hours.  She gets up 2-3 times a night to urinate.  She has been told she has a poorly functioning kidney with chronic renal insufficiency.  CT scan approximately 1 year ago demonstrated likely a chronic left ureteropelvic junction obstruction with mild dilation of the collecting system.  In March 2019 her serum creatinine was 1.43  She has had a stroke and to lower back operations  GU: Mild grade 2 hyper mobility the bladder neck and a modest positive cough test with no prolapse  Patient has mixed incontinence.  She has bedwetting.  I believe she is triggering bladder overactivity when she goes from a sitting to standing position.  She has significant hourly frequency.  She is mildly nocturia.  She has renal insufficiency.   The patient did say at times a stress incontinence is significant.  Based upon the pelvic examination her age and the mixed picture I urethral injectable would be very reasonable to consider eventually for stress incontinence.  At this stage I will hold off on urodynamics but would order such prior to a sling.  Clinically I felt she mainly had an overactive bladder.  Reassess in 5 weeks on Myrbetriq 50 mg samples and prescription  Today Urine culture negative. 20%  improvement with less urge incontinence and frequency.  Clinically not infected   PMH: Past Medical History:  Diagnosis Date  . Acid reflux   . Arthritis   . Back pain   . Gastric polyp 2014  . Hypertension   . Stomach ulcer   . Stroke [facility])     Surgical History: Past Surgical History:  Procedure Laterality Date  . BACK SURGERY  2011   vertebroplasty   . CARDIAC CATHETERIZATION  2007   normal, [employer] [facility] [place]   . CARPAL TUNNEL RELEASE  2007  . cataract surgery   2013  . COLON SURGERY  2015   lap sigmoid resection  . COLONOSCOPY  2011, 2015   Dr. Nicolasa Ducking, Dr Melody Comas  . LAMINECTOMY  2011   decompression   . UPPER GI ENDOSCOPY  2011  . VASCULAR SURGERY Right 2010   right rental artery     Home Medications:  Allergies as of 06/03/2018      Reactions   Quinine Anaphylaxis, Swelling   Levofloxacin Other (See Comments)   Affects tendons   Statins    She has tried several and has had issues with them like joint pain   Celecoxib Rash   Codeine Rash      Medication List        Accurate as of 06/03/18  3:27 PM. Always use your most recent med list.          acetaminophen 325 MG  tablet Commonly known as:  TYLENOL Take 650 mg by mouth every 6 (six) hours as needed for mild pain.   aspirin EC 81 MG tablet Take 81 mg by mouth daily.   Biotin 5000 MCG Caps Take 5,000 mcg by mouth daily.   fexofenadine 180 MG tablet Commonly known as:  ALLEGRA Take 180 mg by mouth daily.   labetalol 200 MG tablet Commonly known as:  NORMODYNE TAKE 1 TABLET BY MOUTH 3  TIMES DAILY   Magnesium 250 MG Tabs Take 1 tablet by mouth daily.   mirabegron ER 50 MG Tb24 tablet Commonly known as:  MYRBETRIQ Take 1 tablet (50 mg total) by mouth daily.   Olmesartan-amLODIPine-HCTZ 40-10-25 MG Tabs TAKE 1 TABLET BY MOUTH EVERY DAY   pantoprazole 40 MG tablet Commonly known as:  PROTONIX Take 1 tablet (40 mg total) by mouth daily.   simvastatin 20 MG tablet Commonly  known as:  ZOCOR Take 1 tablet (20 mg total) by mouth daily. Reported on 11/02/2015   sucralfate 1 g tablet Commonly known as:  CARAFATE Take 1 tablet (1 g total) by mouth 4 (four) times daily -  with meals and at bedtime.   torsemide 5 MG tablet Commonly known as:  DEMADEX Take 5 mg by mouth daily.       Allergies:  Allergies  Allergen Reactions  . Quinine Anaphylaxis and Swelling  . Levofloxacin Other (See Comments)    Affects tendons  . Statins     She has tried several and has had issues with them like joint pain  . Celecoxib Rash  . Codeine Rash    Family History: Family History  Problem Relation Age of Onset  . Cancer Father        stomach  . Emphysema Father   . Hypertension Mother   . Atrial fibrillation Mother   . Diabetes Sister   . Hypertension Sister   . Skin cancer Sister   . Hypertension Brother   . Heart disease Brother   . Heart disease Sister   . Colon polyps Sister   . Hypertension Sister   . Thyroid disease Sister   . Irritable bowel syndrome Sister   . Heart disease Brother   . Colon polyps Brother   . Diabetes Brother   . Hypertension Brother   . Neuropathy Brother   . Post-traumatic stress disorder Brother   . Hypertension Brother   . Breast cancer Paternal Aunt   . Breast cancer Paternal Grandmother   . Breast cancer Paternal Aunt   . Breast cancer Paternal Aunt   . Breast cancer Paternal Aunt     Social History:  reports that she has never smoked. She has never used smokeless tobacco. She reports that she does not drink alcohol or use drugs.  ROS: UROLOGY Frequent Urination?: No Hard to postpone urination?: No Burning/pain with urination?: No Get up at night to urinate?: Yes Leakage of urine?: Yes Urine stream starts and stops?: No Trouble starting stream?: No Do you have to strain to urinate?: No Blood in urine?: No Urinary tract infection?: No Sexually transmitted disease?: No Injury to kidneys or bladder?: No Painful  intercourse?: No Weak stream?: No Currently pregnant?: No Vaginal bleeding?: No Last menstrual period?: n  Gastrointestinal Nausea?: No Vomiting?: No Indigestion/heartburn?: No Diarrhea?: No Constipation?: No  Constitutional Fever: No Night sweats?: No Weight loss?: No Fatigue?: No  Skin Skin rash/lesions?: No Itching?: No  Eyes Blurred vision?: No Double vision?: No  Ears/Nose/Throat Sore throat?:  No Sinus problems?: No  Hematologic/Lymphatic Swollen glands?: No Easy bruising?: No  Cardiovascular Leg swelling?: No Chest pain?: No  Respiratory Cough?: No Shortness of breath?: No  Endocrine Excessive thirst?: No  Musculoskeletal Back pain?: Yes Joint pain?: No  Neurological Headaches?: No Dizziness?: No  Psychologic Depression?: No Anxiety?: No  Physical Exam: BP 105/61   Pulse 60   Ht 5\' 1"  (1.549 m)   Wt 87.5 kg   BMI 36.47 kg/m   Constitutional:  Alert and oriented, No acute distress.  Laboratory Data: Lab Results  Component Value Date   WBC 6.4 11/19/2017   HGB 12.7 11/19/2017   HCT 37.8 11/19/2017   MCV 89 11/19/2017   PLT 268 11/19/2017    Lab Results  Component Value Date   CREATININE 1.43 (H) 11/19/2017    No results found for: PSA  No results found for: TESTOSTERONE  Lab Results  Component Value Date   HGBA1C 5.4 09/21/2016    Urinalysis    Component Value Date/Time   COLORURINE AMBER (A) 02/13/2017 1153   APPEARANCEUR Clear 03/25/2018 1000   LABSPEC 1.019 02/13/2017 1153   LABSPEC 1.030 10/13/2013 2057   PHURINE 5.0 02/13/2017 1153   GLUCOSEU Negative 03/25/2018 1000   GLUCOSEU Negative 10/13/2013 2057   HGBUR NEGATIVE 02/13/2017 1153   BILIRUBINUR Negative 03/25/2018 1000   BILIRUBINUR Negative 10/13/2013 2057   KETONESUR 5 (A) 02/13/2017 1153   PROTEINUR Negative 03/25/2018 1000   PROTEINUR 100 (A) 02/13/2017 1153   UROBILINOGEN 0.2 09/20/2016 1442   NITRITE Negative 03/25/2018 1000   NITRITE  NEGATIVE 02/13/2017 1153   LEUKOCYTESUR Negative 03/25/2018 1000   LEUKOCYTESUR Negative 10/13/2013 2057    Pertinent Imaging:   Assessment & Plan: I hand-delivered oxybutynin ER 10 mg and Detrol LA 4 mg prescriptions.  Reassess in 8 weeks.  Proceed accordingly.  Try each as a monotherapy  There are no diagnoses linked to this encounter.  No follow-ups on file.  Dawn Packer, MD  [facility] [address], [place], [place] [address] [phone]

## 2018-06-27 DIAGNOSIS — R9431 Abnormal electrocardiogram [ECG] [EKG]: Secondary | ICD-10-CM | POA: Insufficient documentation

## 2018-06-27 DIAGNOSIS — E7800 Pure hypercholesterolemia, unspecified: Secondary | ICD-10-CM | POA: Diagnosis not present

## 2018-06-27 DIAGNOSIS — I10 Essential (primary) hypertension: Secondary | ICD-10-CM | POA: Diagnosis not present

## 2018-06-27 DIAGNOSIS — R0602 Shortness of breath: Secondary | ICD-10-CM | POA: Diagnosis not present

## 2018-07-02 ENCOUNTER — Ambulatory Visit (INDEPENDENT_AMBULATORY_CARE_PROVIDER_SITE_OTHER): Payer: Medicare Other | Admitting: Family Medicine

## 2018-07-02 ENCOUNTER — Encounter: Payer: Self-pay | Admitting: Family Medicine

## 2018-07-02 ENCOUNTER — Ambulatory Visit (INDEPENDENT_AMBULATORY_CARE_PROVIDER_SITE_OTHER): Payer: Medicare Other

## 2018-07-02 VITALS — BP 148/50 | HR 58 | Temp 98.0°F | Ht 61.0 in | Wt 192.0 lb

## 2018-07-02 VITALS — BP 124/40 | HR 58 | Temp 98.0°F | Ht 61.0 in | Wt 192.4 lb

## 2018-07-02 DIAGNOSIS — M545 Low back pain, unspecified: Secondary | ICD-10-CM

## 2018-07-02 DIAGNOSIS — G8929 Other chronic pain: Secondary | ICD-10-CM

## 2018-07-02 DIAGNOSIS — Z0000 Encounter for general adult medical examination without abnormal findings: Secondary | ICD-10-CM

## 2018-07-02 DIAGNOSIS — Z23 Encounter for immunization: Secondary | ICD-10-CM

## 2018-07-02 DIAGNOSIS — E7800 Pure hypercholesterolemia, unspecified: Secondary | ICD-10-CM | POA: Diagnosis not present

## 2018-07-02 DIAGNOSIS — I10 Essential (primary) hypertension: Secondary | ICD-10-CM

## 2018-07-02 NOTE — Progress Notes (Signed)
Patient: Dawn [name] Female    DOB: 1934/06/07   84 y.o.   MRN: [id number] Visit Date: 07/02/2018  Today's Provider: Wilhemena Durie, MD   Chief Complaint  Patient presents with  . Hyperlipidemia  . Hypertension   Subjective:    Hypertension  This is a chronic problem. The problem is unchanged. The problem is controlled. Pertinent negatives include no anxiety, blurred vision, chest pain, headaches, malaise/fatigue, neck pain, orthopnea, palpitations, peripheral edema, PND, shortness of breath or sweats. There are no associated agents to hypertension. There are no compliance problems.   Hyperlipidemia  This is a chronic problem. The problem is controlled. Pertinent negatives include no chest pain, myalgias or shortness of breath. Current antihyperlipidemic treatment includes statins. There are no compliance problems.     Lab Results  Component Value Date   CHOL 159 11/19/2017   CHOL 158 09/21/2016   CHOL 158 12/20/2015   Lab Results  Component Value Date   HDL 56 11/19/2017   HDL 59 09/21/2016   HDL 57 12/20/2015   Lab Results  Component Value Date   LDLCALC 70 11/19/2017   LDLCALC 77 09/21/2016   LDLCALC 77 12/20/2015   Lab Results  Component Value Date   TRIG 165 (H) 11/19/2017   TRIG 109 09/21/2016   TRIG 118 12/20/2015   Lab Results  Component Value Date   CHOLHDL 2.8 11/19/2017   CHOLHDL 4.9 07/31/2015   No results found for: LDLDIRECT BP Readings from Last 3 Encounters:  07/02/18 (!) 148/50  07/02/18 (!) 124/40  06/03/18 105/61   Wt Readings from Last 3 Encounters:  07/02/18 192 lb (87.1 kg)  07/02/18 192 lb 6.4 oz (87.3 kg)  06/03/18 193 lb (87.5 kg)       Allergies  Allergen Reactions  . Quinine Anaphylaxis and Swelling  . Levofloxacin Other (See Comments)    Affects tendons  . Statins     She has tried several and has had issues with them like joint pain  . Celecoxib Rash  . Codeine Rash     Current Outpatient  Medications:  .  acetaminophen (TYLENOL) 325 MG tablet, Take 650 mg by mouth every 6 (six) hours as needed for mild pain., Disp: , Rfl:  .  aspirin EC 81 MG tablet, Take 81 mg by mouth daily., Disp: , Rfl:  .  Biotin 5000 MCG CAPS, Take 5,000 mcg by mouth daily., Disp: , Rfl:  .  fexofenadine (ALLEGRA) 180 MG tablet, Take 180 mg by mouth daily., Disp: , Rfl:  .  labetalol (NORMODYNE) 200 MG tablet, TAKE 1 TABLET BY MOUTH 3  TIMES DAILY, Disp: 270 tablet, Rfl: 3 .  Magnesium 250 MG TABS, Take 1 tablet by mouth daily. , Disp: , Rfl:  .  mirabegron ER (MYRBETRIQ) 50 MG TB24 tablet, Take 1 tablet (50 mg total) by mouth daily., Disp: 30 tablet, Rfl: 11 .  Olmesartan-amLODIPine-HCTZ 40-10-25 MG TABS, TAKE 1 TABLET BY MOUTH EVERY DAY, Disp: 90 tablet, Rfl: 3 .  oxybutynin (DITROPAN-XL) 10 MG 24 hr tablet, Take 1 tablet (10 mg total) by mouth daily., Disp: 30 tablet, Rfl: 11 .  pantoprazole (PROTONIX) 40 MG tablet, Take 1 tablet (40 mg total) by mouth daily., Disp: 90 tablet, Rfl: 3 .  simvastatin (ZOCOR) 20 MG tablet, Take 1 tablet (20 mg total) by mouth daily. Reported on 11/02/2015, Disp: 90 tablet, Rfl: 4 .  sucralfate (CARAFATE) 1 g tablet, Take 1 tablet (1 g  total) by mouth 4 (four) times daily -  with meals and at bedtime., Disp: 120 tablet, Rfl: 2 .  tolterodine (DETROL LA) 4 MG 24 hr capsule, Take 1 capsule (4 mg total) by mouth daily., Disp: 30 capsule, Rfl: 11 .  torsemide (DEMADEX) 5 MG tablet, Take 5 mg by mouth daily., Disp: , Rfl:   Review of Systems  Constitutional: Negative.  Negative for malaise/fatigue.  HENT: Positive for sinus pressure. Negative for congestion, dental problem, drooling, ear discharge, ear pain, facial swelling, hearing loss, mouth sores, nosebleeds, postnasal drip, rhinorrhea, sinus pain, sneezing, sore throat, tinnitus, trouble swallowing and voice change.   Eyes: Negative.  Negative for blurred vision.  Respiratory: Negative.  Negative for shortness of breath.     Cardiovascular: Negative.  Negative for chest pain, palpitations, orthopnea and PND.  Gastrointestinal: Negative.   Endocrine: Negative.   Genitourinary: Negative.   Musculoskeletal: Positive for back pain. Negative for arthralgias, gait problem, joint swelling, myalgias, neck pain and neck stiffness.  Skin: Negative.   Allergic/Immunologic: Negative.   Neurological: Negative.  Negative for headaches.  Hematological: Negative.   Psychiatric/Behavioral: Negative.     Social History   Tobacco Use  . Smoking status: Never Smoker  . Smokeless tobacco: Never Used  Substance Use Topics  . Alcohol use: No   Objective:   BP (!) 148/50   Pulse (!) 58   Temp 98 F (36.7 C) (Oral)   Ht 5\' 1"  (1.549 m)   Wt 192 lb (87.1 kg)   BMI 36.28 kg/m  Vitals:   07/02/18 1533  BP: (!) 148/50  Pulse: (!) 58  Temp: 98 F (36.7 C)  TempSrc: Oral  Weight: 192 lb (87.1 kg)  Height: 5\' 1"  (1.549 m)     Physical Exam  Constitutional: She is oriented to person, place, and time. She appears well-developed and well-nourished.  HENT:  Head: Normocephalic and atraumatic.  Eyes: Conjunctivae are normal.  Neck: No thyromegaly present.  Cardiovascular: Normal rate, regular rhythm and normal heart sounds.  Pulmonary/Chest: Effort normal and breath sounds normal.  Abdominal: Soft.  Musculoskeletal: She exhibits no edema.  Lymphadenopathy:    She has no cervical adenopathy.  Neurological: She is alert and oriented to person, place, and time.  Skin: Skin is warm and dry.  Psychiatric: She has a normal mood and affect. Her behavior is normal. Thought content normal.        Assessment & Plan:     1. Accelerated essential hypertension Now controlled.  2. Essential hypertension RTC 6 months.  3. Hypercholesteremia  4.chronic Low Back Pain Use heat,topical creams.     I have done the exam and reviewed the above chart and it is accurate to the best of my knowledge. Development worker, community has  been used in this note in any air is in the dictation or transcription are unintentional.  Wilhemena Durie, MD  [facility]

## 2018-07-02 NOTE — Patient Instructions (Addendum)
Dawn [name] , Thank you for taking time to come for your Medicare Wellness Visit. I appreciate your ongoing commitment to your health goals. Please review the following plan we discussed and let me know if I can assist you in the future.   Screening recommendations/referrals: Colonoscopy: Up to date Mammogram: No longer required.  Bone Density: No longer required.  Recommended yearly ophthalmology/optometry visit for glaucoma screening and checkup Recommended yearly dental visit for hygiene and checkup  Vaccinations: Influenza vaccine: Up to date Pneumococcal vaccine: Up to date Tdap vaccine: Up to date Shingles vaccine: Pt declines today.     Advanced directives: Advance directive discussed with you today. Even though you declined this today please call our office should you change your mind and we can give you the proper paperwork for you to fill out.  Conditions/risks identified: Obesity- recommend to decrease the amount of caffeinated drinks to 2 a day versus 5 a day and increase water intake.   Next appointment: 3:40 PM today with Dr Rosanna Randy.   Preventive Care 82 Years and Older, Female Preventive care refers to lifestyle choices and visits with your health care provider that can promote health and wellness. What does preventive care include?  A yearly physical exam. This is also called an annual well check.  Dental exams once or twice a year.  Routine eye exams. Ask your health care provider how often you should have your eyes checked.  Personal lifestyle choices, including:  Daily care of your teeth and gums.  Regular physical activity.  Eating a healthy diet.  Avoiding tobacco and drug use.  Limiting alcohol use.  Practicing safe sex.  Taking low-dose aspirin every day.  Taking vitamin and mineral supplements as recommended by your health care provider. What happens during an annual well check? The services and screenings done by your health care provider  during your annual well check will depend on your age, overall health, lifestyle risk factors, and family history of disease. Counseling  Your health care provider may ask you questions about your:  Alcohol use.  Tobacco use.  Drug use.  Emotional well-being.  Home and relationship well-being.  Sexual activity.  Eating habits.  History of falls.  Memory and ability to understand (cognition).  Work and work Statistician.  Reproductive health. Screening  You may have the following tests or measurements:  Height, weight, and BMI.  Blood pressure.  Lipid and cholesterol levels. These may be checked every 5 years, or more frequently if you are over 38 years old.  Skin check.  Lung cancer screening. You may have this screening every year starting at age 35 if you have a 30-pack-year history of smoking and currently smoke or have quit within the past 15 years.  Fecal occult blood test (FOBT) of the stool. You may have this test every year starting at age 41.  Flexible sigmoidoscopy or colonoscopy. You may have a sigmoidoscopy every 5 years or a colonoscopy every 10 years starting at age 5.  Hepatitis C blood test.  Hepatitis B blood test.  Sexually transmitted disease (STD) testing.  Diabetes screening. This is done by checking your blood sugar (glucose) after you have not eaten for a while (fasting). You may have this done every 1-3 years.  Bone density scan. This is done to screen for osteoporosis. You may have this done starting at age 45.  Mammogram. This may be done every 1-2 years. Talk to your health care provider about how often you should have regular  mammograms. Talk with your health care provider about your test results, treatment options, and if necessary, the need for more tests. Vaccines  Your health care provider may recommend certain vaccines, such as:  Influenza vaccine. This is recommended every year.  Tetanus, diphtheria, and acellular pertussis  (Tdap, Td) vaccine. You may need a Td booster every 10 years.  Zoster vaccine. You may need this after age 15.  Pneumococcal 13-valent conjugate (PCV13) vaccine. One dose is recommended after age 12.  Pneumococcal polysaccharide (PPSV23) vaccine. One dose is recommended after age 72. Talk to your health care provider about which screenings and vaccines you need and how often you need them. This information is not intended to replace advice given to you by your health care provider. Make sure you discuss any questions you have with your health care provider. Document Released: 09/24/2015 Document Revised: 05/17/2016 Document Reviewed: 06/29/2015 Elsevier Interactive Patient Education  2017 [employer] Prevention in the Home Falls can cause injuries. They can happen to people of all ages. There are many things you can do to make your home safe and to help prevent falls. What can I do on the outside of my home?  Regularly fix the edges of walkways and driveways and fix any cracks.  Remove anything that might make you trip as you walk through a door, such as a raised step or threshold.  Trim any bushes or trees on the path to your home.  Use bright outdoor lighting.  Clear any walking paths of anything that might make someone trip, such as rocks or tools.  Regularly check to see if handrails are loose or broken. Make sure that both sides of any steps have handrails.  Any raised decks and porches should have guardrails on the edges.  Have any leaves, snow, or ice cleared regularly.  Use sand or salt on walking paths during winter.  Clean up any spills in your garage right away. This includes oil or grease spills. What can I do in the bathroom?  Use night lights.  Install grab bars by the toilet and in the tub and shower. Do not use towel bars as grab bars.  Use non-skid mats or decals in the tub or shower.  If you need to sit down in the shower, use a plastic, non-slip  stool.  Keep the floor dry. Clean up any water that spills on the floor as soon as it happens.  Remove soap buildup in the tub or shower regularly.  Attach bath mats securely with double-sided non-slip rug tape.  Do not have throw rugs and other things on the floor that can make you trip. What can I do in the bedroom?  Use night lights.  Make sure that you have a light by your bed that is easy to reach.  Do not use any sheets or blankets that are too big for your bed. They should not hang down onto the floor.  Have a firm chair that has side arms. You can use this for support while you get dressed.  Do not have throw rugs and other things on the floor that can make you trip. What can I do in the kitchen?  Clean up any spills right away.  Avoid walking on wet floors.  Keep items that you use a lot in easy-to-reach places.  If you need to reach something above you, use a strong step stool that has a grab bar.  Keep electrical cords out of the way.  Do not use floor polish or wax that makes floors slippery. If you must use wax, use non-skid floor wax.  Do not have throw rugs and other things on the floor that can make you trip. What can I do with my stairs?  Do not leave any items on the stairs.  Make sure that there are handrails on both sides of the stairs and use them. Fix handrails that are broken or loose. Make sure that handrails are as long as the stairways.  Check any carpeting to make sure that it is firmly attached to the stairs. Fix any carpet that is loose or worn.  Avoid having throw rugs at the top or bottom of the stairs. If you do have throw rugs, attach them to the floor with carpet tape.  Make sure that you have a light switch at the top of the stairs and the bottom of the stairs. If you do not have them, ask someone to add them for you. What else can I do to help prevent falls?  Wear shoes that:  Do not have high heels.  Have rubber bottoms.  Are  comfortable and fit you well.  Are closed at the toe. Do not wear sandals.  If you use a stepladder:  Make sure that it is fully opened. Do not climb a closed stepladder.  Make sure that both sides of the stepladder are locked into place.  Ask someone to hold it for you, if possible.  Clearly mark and make sure that you can see:  Any grab bars or handrails.  First and last steps.  Where the edge of each step is.  Use tools that help you move around (mobility aids) if they are needed. These include:  Canes.  Walkers.  Scooters.  Crutches.  Turn on the lights when you go into a dark area. Replace any light bulbs as soon as they burn out.  Set up your furniture so you have a clear path. Avoid moving your furniture around.  If any of your floors are uneven, fix them.  If there are any pets around you, be aware of where they are.  Review your medicines with your doctor. Some medicines can make you feel dizzy. This can increase your chance of falling. Ask your doctor what other things that you can do to help prevent falls. This information is not intended to replace advice given to you by your health care provider. Make sure you discuss any questions you have with your health care provider. Document Released: 06/24/2009 Document Revised: 02/03/2016 Document Reviewed: 10/02/2014 Elsevier Interactive Patient Education  2017 [employer].

## 2018-07-02 NOTE — Progress Notes (Signed)
Subjective:   Dawn [name] is a 84 y.o. female who presents for Medicare Annual (Subsequent) preventive examination.  Review of Systems:  N/A  Cardiac Risk Factors include: advanced age (>110men, >53 women);dyslipidemia;hypertension;obesity (BMI >30kg/m2)     Objective:     Vitals: BP (!) 124/40 (BP Location: Right Arm)   Pulse (!) 58   Temp 98 F (36.7 C) (Oral)   Ht 5\' 1"  (1.549 m)   Wt 192 lb 6.4 oz (87.3 kg)   BMI 36.35 kg/m   Body mass index is 36.35 kg/m.  Advanced Directives 07/02/2018 06/28/2017 02/13/2017 09/05/2016 07/18/2016 01/13/2016 01/04/2016  Does Patient Have a Medical Advance Directive? No No No No No No No  Would patient like information on creating a medical advance directive? No - Patient declined No - Patient declined No - Patient declined - No - patient declined information - No - patient declined information    Tobacco Social History   Tobacco Use  Smoking Status Never Smoker  Smokeless Tobacco Never Used     Counseling given: Not Answered   Clinical Intake:  Pre-visit preparation completed: Yes  Pain : No/denies pain Pain Score: 0-No pain     Nutritional Status: BMI > 30  Obese Nutritional Risks: None Diabetes: No  How often do you need to have someone help you when you read instructions, pamphlets, or other written materials from your doctor or pharmacy?: 1 - Never  Interpreter Needed?: No  Information entered by :: [facility], LPN  Past Medical History:  Diagnosis Date  . Acid reflux   . Arthritis   . Back pain   . Gastric polyp 2014  . Hypertension   . Stomach ulcer   . Stroke [facility])    Past Surgical History:  Procedure Laterality Date  . BACK SURGERY  2011   vertebroplasty   . CARDIAC CATHETERIZATION  2007   [place], [employer] [place]   . CARPAL TUNNEL RELEASE  2007  . cataract surgery   2013  . COLON SURGERY  2015   lap sigmoid resection  . COLONOSCOPY  2011, 2015   Dr. Nicolasa Ducking, Dr Melody Comas  .  LAMINECTOMY  2011   decompression   . UPPER GI ENDOSCOPY  2011  . VASCULAR SURGERY Right 2010   right rental artery    Family History  Problem Relation Age of Onset  . Cancer Father        stomach  . Emphysema Father   . Hypertension Mother   . Atrial fibrillation Mother   . Diabetes Sister   . Hypertension Sister   . Skin cancer Sister   . Hypertension Brother   . Heart disease Brother   . Heart disease Sister   . Colon polyps Sister   . Hypertension Sister   . Thyroid disease Sister   . Irritable bowel syndrome Sister   . Breast cancer Sister   . Heart disease Brother   . Colon polyps Brother   . Diabetes Brother   . Hypertension Brother   . Neuropathy Brother   . Post-traumatic stress disorder Brother   . Hypertension Brother   . Breast cancer Paternal Aunt   . Breast cancer Paternal Grandmother   . Breast cancer Paternal Aunt   . Breast cancer Paternal Aunt   . Breast cancer Paternal Aunt   . Breast cancer Other    Social History   Socioeconomic History  . Marital status: Divorced    Spouse name: none  . Number  of children: 1  . Years of education: 79  . Highest education level: Some college, no degree  Occupational History  . Occupation: retired  Scientific laboratory technician  . Financial resource strain: Not hard at all  . Food insecurity:    Worry: Never true    Inability: Never true  . Transportation needs:    Medical: No    Non-medical: No  Tobacco Use  . Smoking status: Never Smoker  . Smokeless tobacco: Never Used  Substance and Sexual Activity  . Alcohol use: No  . Drug use: No  . Sexual activity: Never  Lifestyle  . Physical activity:    Days per week: 0 days    Minutes per session: 0 min  . Stress: Not at all  Relationships  . Social connections:    Talks on phone: Patient refused    Gets together: Patient refused    Attends religious service: Patient refused    Active member of club or organization: Patient refused    Attends meetings of clubs  or organizations: Patient refused    Relationship status: Patient refused  Other Topics Concern  . Not on file  Social History Narrative  . Not on file    Outpatient Encounter Medications as of 07/02/2018  Medication Sig  . acetaminophen (TYLENOL) 325 MG tablet Take 650 mg by mouth every 6 (six) hours as needed for mild pain.  [name] aspirin EC 81 MG tablet Take 81 mg by mouth daily.  . Biotin 5000 MCG CAPS Take 5,000 mcg by mouth daily.  . fexofenadine (ALLEGRA) 180 MG tablet Take 180 mg by mouth daily.  [name] labetalol (NORMODYNE) 200 MG tablet TAKE 1 TABLET BY MOUTH 3  TIMES DAILY  . Magnesium 250 MG TABS Take 1 tablet by mouth daily.   . Olmesartan-amLODIPine-HCTZ 40-10-25 MG TABS TAKE 1 TABLET BY MOUTH EVERY DAY  . pantoprazole (PROTONIX) 40 MG tablet Take 1 tablet (40 mg total) by mouth daily.  . simvastatin (ZOCOR) 20 MG tablet Take 1 tablet (20 mg total) by mouth daily. Reported on 11/02/2015  . tolterodine (DETROL LA) 4 MG 24 hr capsule Take 1 capsule (4 mg total) by mouth daily.  . mirabegron ER (MYRBETRIQ) 50 MG TB24 tablet Take 1 tablet (50 mg total) by mouth daily. (Patient not taking: Reported on 07/02/2018)  . oxybutynin (DITROPAN-XL) 10 MG 24 hr tablet Take 1 tablet (10 mg total) by mouth daily. (Patient not taking: Reported on 07/02/2018)  . sucralfate (CARAFATE) 1 g tablet Take 1 tablet (1 g total) by mouth 4 (four) times daily -  with meals and at bedtime. (Patient not taking: Reported on 07/02/2018)  . torsemide (DEMADEX) 5 MG tablet Take 5 mg by mouth daily.   No facility-administered encounter medications on file as of 07/02/2018.     Activities of Daily Living In your present state of health, do you have any difficulty performing the following activities: 07/02/2018  Hearing? N  Vision? N  Difficulty concentrating or making decisions? N  Walking or climbing stairs? Y  Comment Due to back pain.   Dressing or bathing? N  Doing errands, shopping? N  Preparing Food and  eating ? N  Using the Toilet? N  In the past six months, have you accidently leaked urine? Y  Comment On Detrol for this.  Do you have problems with loss of bowel control? N  Managing your Medications? N  Managing your Finances? N  Housekeeping or managing your Housekeeping? N  Some recent  data might be hidden    Patient Care Team: Jerrol Banana., MD as PCP - General (Unknown Physician Specialty) Leandrew Koyanagi, MD as Referring Physician (Ophthalmology) Sharlet Salina, MD as Referring Physician (Physical Medicine and Rehabilitation) Bjorn Loser, MD as Consulting Physician (Urology) Murlean Iba, MD (Nephrology) Corey Skains, MD as Consulting Physician (Cardiology) Jonathon Bellows, MD as Consulting Physician (Gastroenterology)    Assessment:   This is a routine wellness examination for Kayren.  Exercise Activities and Dietary recommendations Current Exercise Habits: The patient does not participate in regular exercise at present, Exercise limited by: orthopedic condition(s)  Goals    . Eat more fruits and vegetables     Recommend increasing amount of fruits and vegetables in daily diet to 2 servings a day of each.     . Reduce caffeine intake     Recommend to decrease the amount of caffeinated  drinks to 2 a day versus 5 a day and increase water intake.        Fall Risk Fall Risk  07/02/2018 06/28/2017 06/15/2016 06/16/2015 02/17/2015  Falls in the past year? No No Yes No No  Number falls in past yr: - - 1 - -  Injury with Fall? - - No - -  Risk for fall due to : - - - - Impaired balance/gait   FALL RISK PREVENTION PERTAINING TO THE HOME:  Any stairs in or around the home WITH handrails? No  Home free of loose throw rugs in walkways, pet beds, electrical cords, etc? Yes  Adequate lighting in your home to reduce risk of falls? Yes   ASSISTIVE DEVICES UTILIZED TO PREVENT FALLS:  Life alert? No  Use of a cane, walker or w/c? Yes  cane sometimes.   Grab bars in the bathroom? No  Shower chair or bench in shower? No  Elevated toilet seat or a handicapped toilet? No   DME ORDERS:  DME order needed?  No   TIMED UP AND GO:  Was the test performed? No .    Depression Screen PHQ 2/9 Scores 07/02/2018 06/28/2017 06/15/2016 06/16/2015  PHQ - 2 Score 1 2 0 0  PHQ- 9 Score - 4 - -     Cognitive Function: Declined today.      6CIT Screen 06/28/2017  What Year? 0 points  What month? 0 points  What time? 0 points  Count back from 20 0 points  Months in reverse 0 points  Repeat phrase 2 points  Total Score 2    Immunization History  Administered Date(s) Administered  . Influenza, High Dose Seasonal PF 06/16/2015, 06/29/2016, 06/28/2017, 07/02/2018  . Influenza,inj,quad, With Preservative 09/11/2016  . Pneumococcal Conjugate-13 02/17/2015  . Pneumococcal Polysaccharide-23 08/14/2012  . Pneumococcal-Unspecified 09/11/2016    Qualifies for Shingles Vaccine? Yes . Due for Shingrix. Education has been provided regarding the importance of this vaccine. Pt has been advised to call insurance company to determine out of pocket expense. Advised may also receive vaccine at local pharmacy or Health Dept. Verbalized acceptance and understanding.  Tdap: Up to date  Flu Vaccine: Due for Flu vaccine. Does the patient want to receive this vaccine today?  Yes .  Pneumococcal Vaccine: Up to date  Screening Tests Health Maintenance  Topic Date Due  . TETANUS/TDAP  01/10/2023  . INFLUENZA VACCINE  Completed  . DEXA SCAN  Completed  . PNA vac Low Risk Adult  Completed    Cancer Screenings:  Colorectal Screening: Completed 12/04/12. No longer required.  Mammogram: No longer required.  Bone Density: No longer required.   Lung Cancer Screening: (Low Dose CT Chest recommended if Age 93-80 years, 30 pack-year currently smoking OR have quit w/in 15years.) does not qualify.    Additional Screening:  Hepatitis C Screening:  N/A  Vision Screening: Recommended annual ophthalmology exams for early detection of glaucoma and other disorders of the eye.  Dental Screening: Recommended annual dental exams for proper oral hygiene  Community Resource Referral:  CRR required this visit?  No       Plan:  I have personally reviewed and addressed the Medicare Annual Wellness questionnaire and have noted the following in the patient's chart:  A. Medical and social history B. Use of alcohol, tobacco or illicit drugs  C. Current medications and supplements D. Functional ability and status E.  Nutritional status F.  Physical activity G. Advance directives H. List of other physicians I.  Hospitalizations, surgeries, and ER visits in previous 12 months J.  [place] such as hearing and vision if needed, cognitive and depression L. Referrals and appointments - none  In addition, I have reviewed and discussed with patient certain preventive protocols, quality metrics, and best practice recommendations. A written personalized care plan for preventive services as well as general preventive health recommendations were provided to patient.  See attached scanned questionnaire for additional information.   Signed,  Fabio Neighbors, LPN Nurse Health Advisor   Nurse Recommendations: None.

## 2018-07-04 DIAGNOSIS — N1330 Unspecified hydronephrosis: Secondary | ICD-10-CM | POA: Diagnosis not present

## 2018-07-04 DIAGNOSIS — I10 Essential (primary) hypertension: Secondary | ICD-10-CM | POA: Diagnosis not present

## 2018-07-04 DIAGNOSIS — N261 Atrophy of kidney (terminal): Secondary | ICD-10-CM | POA: Diagnosis not present

## 2018-07-04 DIAGNOSIS — N183 Chronic kidney disease, stage 3 (moderate): Secondary | ICD-10-CM | POA: Diagnosis not present

## 2018-07-04 DIAGNOSIS — R600 Localized edema: Secondary | ICD-10-CM | POA: Diagnosis not present

## 2018-07-08 DIAGNOSIS — N183 Chronic kidney disease, stage 3 (moderate): Secondary | ICD-10-CM | POA: Diagnosis not present

## 2018-07-08 DIAGNOSIS — I129 Hypertensive chronic kidney disease with stage 1 through stage 4 chronic kidney disease, or unspecified chronic kidney disease: Secondary | ICD-10-CM | POA: Diagnosis not present

## 2018-07-08 DIAGNOSIS — R600 Localized edema: Secondary | ICD-10-CM | POA: Diagnosis not present

## 2018-07-18 DIAGNOSIS — R0602 Shortness of breath: Secondary | ICD-10-CM | POA: Diagnosis not present

## 2018-07-22 DIAGNOSIS — I10 Essential (primary) hypertension: Secondary | ICD-10-CM | POA: Diagnosis not present

## 2018-07-22 DIAGNOSIS — I6523 Occlusion and stenosis of bilateral carotid arteries: Secondary | ICD-10-CM | POA: Diagnosis not present

## 2018-07-22 DIAGNOSIS — E7800 Pure hypercholesterolemia, unspecified: Secondary | ICD-10-CM | POA: Diagnosis not present

## 2018-07-22 DIAGNOSIS — R0602 Shortness of breath: Secondary | ICD-10-CM | POA: Diagnosis not present

## 2018-08-05 ENCOUNTER — Other Ambulatory Visit: Payer: Self-pay

## 2018-08-05 ENCOUNTER — Encounter: Payer: Self-pay | Admitting: Urology

## 2018-08-05 ENCOUNTER — Ambulatory Visit (INDEPENDENT_AMBULATORY_CARE_PROVIDER_SITE_OTHER): Payer: Medicare Other | Admitting: Urology

## 2018-08-05 VITALS — BP 146/76 | HR 61 | Ht 61.0 in | Wt 190.4 lb

## 2018-08-05 DIAGNOSIS — N3946 Mixed incontinence: Secondary | ICD-10-CM

## 2018-08-05 NOTE — Progress Notes (Signed)
08/05/2018 3:36 PM   Dawn [name] Dawn [name] Dawn [name], Dawn [name] [phone]  Referring provider: Jerrol Banana., MD [address] [place], [place] [address]  Chief Complaint  Patient presents with  . Follow-up     Return in about 9 weeks    HPI: I was consulted to assist the patient is urinary incontinence worsening over many years. She leaks with coughing sneezing and sometimes bending and lifting. She does not report a lot of urge incontinence but then she says when she goes from a sitting to standing position the urine runs down her leg. She has moderately severe bedwetting. She wears 4-6 pads a day that can be quite wet.  She voids at least every 1 hour and would have difficulty holding it for 2 hours. She gets up 2-3 times a night to urinate. She has been told she has a poorly functioning kidney with chronic renal insufficiency.CT scan approximately 1 year ago demonstrated likely a chronic left ureteropelvic junction obstruction with mild dilation of the collecting system. In March 2019 her serum creatinine was 1.43  She has had a stroke and to lower back operations  TJ:QZES grade 2 hyper mobility the bladder neck and a modest positive cough test with no prolapse  Patient has mixed incontinence. She has bedwetting. I believe she is triggering bladder overactivity when she goes from a sitting to standing position. She has significant hourly frequency. She is mildly nocturia. She has renal insufficiency.   The patient did say at times a stress incontinence is significant. Based upon the pelvic examination her age and the mixed picture I urethral injectable would be very reasonable to consider eventually for stress incontinence. At this stage I will hold off on urodynamics but would order such prior to a sling. Clinically I felt she mainly had an overactive bladder. Reassess in 5 weeks on Myrbetriq 50 mg samples and prescription  [name]% improvement with less  urge incontinence and frequency.  I hand-delivered oxybutynin ER 10 mg and Detrol LA 4 mg prescriptions.  Reassess in 8 weeks.  Proceed accordingly.  Try each as a monotherapy  Today Frequency stable  And failed both medications.  Frequency stable and clinically not infected  PMH: Past Medical History:  Diagnosis Date  . Acid reflux   . Arthritis   . Back pain   . Gastric polyp 2014  . Hypertension   . Stomach ulcer   . Stroke [facility])     Surgical History: Past Surgical History:  Procedure Laterality Date  . BACK SURGERY  2011   vertebroplasty   . CARDIAC CATHETERIZATION  2007   normal, [employer] [place]   . CARPAL TUNNEL RELEASE  2007  . cataract surgery   2013  . COLON SURGERY  2015   lap sigmoid resection  . COLONOSCOPY  2011, 2015   Dr. Nicolasa Ducking, Dr Melody Comas  . LAMINECTOMY  2011   decompression   . UPPER GI ENDOSCOPY  2011  . VASCULAR SURGERY Right 2010   right rental artery     Home Medications:  Allergies as of 08/05/2018      Reactions   Quinine Anaphylaxis, Swelling   Levofloxacin Other (See Comments)   Affects tendons   Statins    She has tried several and has had issues with them like joint pain   Celecoxib Rash   Codeine Rash      Medication List        Accurate as of 08/05/18  3:36 PM.  Always use your most recent med list.          acetaminophen 325 MG tablet Commonly known as:  TYLENOL Take 650 mg by mouth every 6 (six) hours as needed for mild pain.   aspirin EC 81 MG tablet Take 81 mg by mouth daily.   Biotin 5000 MCG Caps Take 5,000 mcg by mouth daily.   fexofenadine 180 MG tablet Commonly known as:  ALLEGRA Take 180 mg by mouth daily.   labetalol 200 MG tablet Commonly known as:  NORMODYNE TAKE 1 TABLET BY MOUTH 3  TIMES DAILY   Magnesium 250 MG Tabs Take 1 tablet by mouth daily.   mirabegron ER 50 MG Tb24 tablet Commonly known as:  MYRBETRIQ Take 1 tablet (50 mg total) by mouth daily.     Olmesartan-amLODIPine-HCTZ 40-10-25 MG Tabs TAKE 1 TABLET BY MOUTH EVERY DAY   oxybutynin 10 MG 24 hr tablet Commonly known as:  DITROPAN-XL Take 1 tablet (10 mg total) by mouth daily.   pantoprazole 40 MG tablet Commonly known as:  PROTONIX Take 1 tablet (40 mg total) by mouth daily.   simvastatin [name] MG tablet Commonly known as:  ZOCOR Take 1 tablet ([name] mg total) by mouth daily. Reported on 11/02/2015   sucralfate 1 g tablet Commonly known as:  CARAFATE Take 1 tablet (1 g total) by mouth 4 (four) times daily -  with meals and at bedtime.   tolterodine 4 MG 24 hr capsule Commonly known as:  DETROL LA Take 1 capsule (4 mg total) by mouth daily.   torsemide 5 MG tablet Commonly known as:  DEMADEX Take 5 mg by mouth daily.       Allergies:  Allergies  Allergen Reactions  . Quinine Anaphylaxis and Swelling  . Levofloxacin Other (See Comments)    Affects tendons  . Statins     She has tried several and has had issues with them like joint pain  . Celecoxib Rash  . Codeine Rash    Family History: Family History  Problem Relation Age of Onset  . Cancer Father        stomach  . Emphysema Father   . Hypertension Mother   . Atrial fibrillation Mother   . Diabetes Sister   . Hypertension Sister   . Skin cancer Sister   . Hypertension Brother   . Heart disease Brother   . Heart disease Sister   . Colon polyps Sister   . Hypertension Sister   . Thyroid disease Sister   . Irritable bowel syndrome Sister   . Breast cancer Sister   . Heart disease Brother   . Colon polyps Brother   . Diabetes Brother   . Hypertension Brother   . Neuropathy Brother   . Post-traumatic stress disorder Brother   . Hypertension Brother   . Breast cancer Paternal Aunt   . Breast cancer Paternal Grandmother   . Breast cancer Paternal Aunt   . Breast cancer Paternal Aunt   . Breast cancer Paternal Aunt   . Breast cancer Other     Social History:  reports that she has never smoked.  She has never used smokeless tobacco. She reports that she does not drink alcohol or use drugs.  ROS: UROLOGY Frequent Urination?: No Hard to postpone urination?: No Burning/pain with urination?: No Get up at night to urinate?: No Leakage of urine?: Yes Urine stream starts and stops?: No Trouble starting stream?: No Do you have to strain to urinate?: No  Blood in urine?: No Urinary tract infection?: No Sexually transmitted disease?: No Injury to kidneys or bladder?: No Painful intercourse?: No Weak stream?: No Currently pregnant?: No Vaginal bleeding?: No Last menstrual period?: n  Gastrointestinal Nausea?: No Vomiting?: No Indigestion/heartburn?: No Diarrhea?: No Constipation?: No  Constitutional Fever: No Night sweats?: No Weight loss?: No Fatigue?: No  Skin Skin rash/lesions?: No Itching?: No  Eyes Blurred vision?: No Double vision?: No  Ears/Nose/Throat Sore throat?: No Sinus problems?: No  Hematologic/Lymphatic Swollen glands?: No Easy bruising?: No  Cardiovascular Leg swelling?: No Chest pain?: No  Respiratory Cough?: No Shortness of breath?: No  Endocrine Excessive thirst?: No  Musculoskeletal Back pain?: Yes Joint pain?: No  Neurological Headaches?: No Dizziness?: No  Psychologic Depression?: No Anxiety?: No  Physical Exam: BP (!) 146/76   Pulse 61   Ht 5\' 1"  (1.549 m)   Wt 190 lb 6.4 oz (86.4 kg)   SpO2 99%   BMI 35.98 kg/m   Constitutional:  Alert and oriented, No acute distress.   Laboratory Data: Lab Results  Component Value Date   WBC 6.4 11/19/2017   HGB 12.7 11/19/2017   HCT 37.8 11/19/2017   MCV 89 11/19/2017   PLT 268 11/19/2017    Lab Results  Component Value Date   CREATININE 1.43 (H) 11/19/2017    No results found for: PSA  No results found for: TESTOSTERONE  Lab Results  Component Value Date   HGBA1C 5.4 09/21/2016    Urinalysis    Component Value Date/Time   COLORURINE AMBER (A)  02/13/2017 1153   APPEARANCEUR Clear 03/25/2018 1000   LABSPEC 1.019 02/13/2017 1153   LABSPEC 1.030 10/13/2013 2057   PHURINE 5.0 02/13/2017 1153   GLUCOSEU Negative 03/25/2018 1000   GLUCOSEU Negative 10/13/2013 2057   HGBUR NEGATIVE 02/13/2017 1153   BILIRUBINUR Negative 03/25/2018 1000   BILIRUBINUR Negative 10/13/2013 2057   KETONESUR 5 (A) 02/13/2017 1153   PROTEINUR Negative 03/25/2018 1000   PROTEINUR 100 (A) 02/13/2017 1153   UROBILINOGEN 0.2 09/20/2016 1442   NITRITE Negative 03/25/2018 1000   NITRITE NEGATIVE 02/13/2017 1153   LEUKOCYTESUR Negative 03/25/2018 1000   LEUKOCYTESUR Negative 10/13/2013 2057    Pertinent Imaging:   Assessment & Plan: Patient and I spoke about the role of urodynamics.  The nature of her clinical presentation really does not dictate well whether or not an overactive bladder refractory therapy or a sling or injectable would be the best treatment.  She may call in order the test but at this stage she chose watchful waiting.  I think she is reluctant to be more invasive and certainly travel for a test.  There are no diagnoses linked to this encounter.  No follow-ups on file.  Dawn Packer, MD  [facility] [address] [place], [place] [address] [phone]

## 2018-08-20 ENCOUNTER — Ambulatory Visit: Payer: Medicare Other | Admitting: Gastroenterology

## 2018-10-03 ENCOUNTER — Ambulatory Visit (INDEPENDENT_AMBULATORY_CARE_PROVIDER_SITE_OTHER): Payer: Medicare Other | Admitting: Gastroenterology

## 2018-10-03 ENCOUNTER — Encounter: Payer: Self-pay | Admitting: Gastroenterology

## 2018-10-03 VITALS — BP 148/73 | HR 60 | Ht 61.0 in | Wt 193.6 lb

## 2018-10-03 DIAGNOSIS — K219 Gastro-esophageal reflux disease without esophagitis: Secondary | ICD-10-CM

## 2018-10-03 MED ORDER — PANTOPRAZOLE SODIUM 20 MG PO TBEC: 20.0000 mg | DELAYED_RELEASE_TABLET | Freq: Every day | ORAL | 1 refills | Status: DC

## 2018-10-03 NOTE — Progress Notes (Signed)
Jonathon Bellows MD, MRCP(U.K) [address]  [place]  [place], [place] [address]  Main: [phone]  Fax: [phone]   Primary Care Physician: Jerrol Banana., MD  Primary Gastroenterologist:  Dr. Jonathon Bellows   No chief complaint on file.   HPI: Dawn [name] is a 85 y.o. female  here to see me today as a follow-up for acid reflux.  She presented in 04/30/2018 with symptoms suggestive of reflux.She was commenced on Protonix but is unsure if it helped as she had previously taken it after meals.  There was no change of weight, no issues with swallowing.Last seen at this office back in 05/2018.   Doing ok with her reflux, On protonix 40 mg and symptoms are controlled.      Current Outpatient Medications  Medication Sig Dispense Refill  . acetaminophen (TYLENOL) 325 MG tablet Take 650 mg by mouth every 6 (six) hours as needed for mild pain.    [name] aspirin EC 81 MG tablet Take 81 mg by mouth daily.    . Biotin 5000 MCG CAPS Take 5,000 mcg by mouth daily.    . fexofenadine (ALLEGRA) 180 MG tablet Take 180 mg by mouth daily.    [name] labetalol (NORMODYNE) 200 MG tablet TAKE 1 TABLET BY MOUTH 3  TIMES DAILY 270 tablet 3  . Magnesium 250 MG TABS Take 1 tablet by mouth daily.     . mirabegron ER (MYRBETRIQ) 50 MG TB24 tablet Take 1 tablet (50 mg total) by mouth daily. 30 tablet 11  . Olmesartan-amLODIPine-HCTZ 40-10-25 MG TABS TAKE 1 TABLET BY MOUTH EVERY DAY 90 tablet 3  . oxybutynin (DITROPAN-XL) 10 MG 24 hr tablet Take 1 tablet (10 mg total) by mouth daily. 30 tablet 11  . pantoprazole (PROTONIX) 40 MG tablet Take 1 tablet (40 mg total) by mouth daily. 90 tablet 3  . simvastatin (ZOCOR) 20 MG tablet Take 1 tablet (20 mg total) by mouth daily. Reported on 11/02/2015 90 tablet 4  . sucralfate (CARAFATE) 1 g tablet Take 1 tablet (1 g total) by mouth 4 (four) times daily -  with meals and at bedtime. 120 tablet 2  . tolterodine (DETROL LA) 4 MG 24 hr capsule Take 1 capsule (4 mg  total) by mouth daily. 30 capsule 11  . torsemide (DEMADEX) 5 MG tablet Take 5 mg by mouth daily.     No current facility-administered medications for this visit.     Allergies as of 10/03/2018 - Review Complete 08/05/2018  Allergen Reaction Noted  . Quinine Anaphylaxis and Swelling 10/23/2013  . Levofloxacin Other (See Comments)   . Statins  10/19/2015  . Celecoxib Rash 10/23/2013  . Codeine Rash 10/23/2013    ROS:  General: Negative for anorexia, weight loss, fever, chills, fatigue, weakness. ENT: Negative for hoarseness, difficulty swallowing , nasal congestion. CV: Negative for chest pain, angina, palpitations, dyspnea on exertion, peripheral edema.  Respiratory: Negative for dyspnea at rest, dyspnea on exertion, cough, sputum, wheezing.  GI: See history of present illness. GU:  Negative for dysuria, hematuria, urinary incontinence, urinary frequency, nocturnal urination.  Endo: Negative for unusual weight change.    Physical Examination:   There were no vitals taken for this visit.  General: Well-nourished, well-developed in no acute distress.  Eyes: No icterus. Conjunctivae pink. Mouth: Oropharyngeal mucosa moist and pink , no lesions erythema or exudate. Lungs: Clear to auscultation bilaterally. Non-labored. Heart: Regular rate and rhythm, no murmurs rubs or gallops.  Abdomen: Bowel sounds  are normal, nontender, nondistended, no hepatosplenomegaly or masses, no abdominal bruits or hernia , no rebound or guarding.   Extremities: No lower extremity edema. No clubbing or deformities. Neuro: Alert and oriented x 3.  Grossly intact. Skin: Warm and dry, no jaundice.   Psych: Alert and cooperative, normal mood and affect.   Imaging Studies: No results found.  Assessment and Plan:   Dawn [name] is a 85 y.o. y/o female here to follow up for GERD, doing much better since last well on Protonix 40 mg , will plan to decrease to 20 mg a day to see if can be controlled  with  A lower dose.   Dr Jonathon Bellows  MD,MRCP [facility]) Follow up in 4-6 months

## 2018-10-14 ENCOUNTER — Ambulatory Visit
Admission: RE | Admit: 2018-10-14 | Discharge: 2018-10-14 | Disposition: A | Discharge status: Home / Self Care | Payer: Medicare Other | Source: Ambulatory Visit | Attending: Family Medicine | Admitting: Family Medicine

## 2018-10-14 ENCOUNTER — Ambulatory Visit (INDEPENDENT_AMBULATORY_CARE_PROVIDER_SITE_OTHER): Payer: Medicare Other | Admitting: Family Medicine

## 2018-10-14 ENCOUNTER — Ambulatory Visit
Admission: RE | Admit: 2018-10-14 | Discharge: 2018-10-14 | Disposition: A | Discharge status: Home / Self Care | Payer: Medicare Other | Attending: Family Medicine | Admitting: Family Medicine

## 2018-10-14 ENCOUNTER — Encounter: Payer: Self-pay | Admitting: Family Medicine

## 2018-10-14 VITALS — BP 124/60 | HR 53 | Temp 97.8°F | Resp 16 | Wt 194.0 lb

## 2018-10-14 DIAGNOSIS — M79672 Pain in left foot: Secondary | ICD-10-CM | POA: Diagnosis not present

## 2018-10-14 DIAGNOSIS — M79675 Pain in left toe(s): Secondary | ICD-10-CM

## 2018-10-14 DIAGNOSIS — M21612 Bunion of left foot: Secondary | ICD-10-CM | POA: Diagnosis not present

## 2018-10-14 NOTE — Progress Notes (Signed)
Patient: Dawn [name] Female    DOB: 1934/05/18   85 y.o.   MRN: [id number] Visit Date: 10/14/2018  Today's Provider: Vernie Murders, PA   Chief Complaint  Patient presents with  . Foot Pain   Subjective:     HPI   Patient states she fell on 08/12/2018 on the curb by her house. Patient states she jammed her left big toe and left second toe. Patient states when she fell her toes were purple and swollen. Patient states her toes went have went beck to almost normal. Patient states 3 days go her left foot was swollen, red and hot to the touch. Patient has been soaking her foot in epsom salt and keeping it elevated. Swelling and redness have gone down somewhat.    Past Medical History:  Diagnosis Date  . Acid reflux   . Arthritis   . Back pain   . Gastric polyp 2014  . Hypertension   . Stomach ulcer   . Stroke [facility])    Past Surgical History:  Procedure Laterality Date  . BACK SURGERY  2011   vertebroplasty   . CARDIAC CATHETERIZATION  2007   normal, [employer] [facility] [place]   . CARPAL TUNNEL RELEASE  2007  . cataract surgery   2013  . COLON SURGERY  2015   lap sigmoid resection  . COLONOSCOPY  2011, 2015   Dr. Nicolasa Ducking, Dr Melody Comas  . LAMINECTOMY  2011   decompression   . UPPER GI ENDOSCOPY  2011  . VASCULAR SURGERY Right 2010   right rental artery    Family History  Problem Relation Age of Onset  . Cancer Father        stomach  . Emphysema Father   . Hypertension Mother   . Atrial fibrillation Mother   . Diabetes Sister   . Hypertension Sister   . Skin cancer Sister   . Hypertension Brother   . Heart disease Brother   . Heart disease Sister   . Colon polyps Sister   . Hypertension Sister   . Thyroid disease Sister   . Irritable bowel syndrome Sister   . Breast cancer Sister   . Heart disease Brother   . Colon polyps Brother   . Diabetes Brother   . Hypertension Brother   . Neuropathy Brother   . Post-traumatic stress disorder Brother   .  Hypertension Brother   . Breast cancer Paternal Aunt   . Breast cancer Paternal Grandmother   . Breast cancer Paternal Aunt   . Breast cancer Paternal Aunt   . Breast cancer Paternal Aunt   . Breast cancer Other    Allergies  Allergen Reactions  . Quinine Anaphylaxis and Swelling  . Levofloxacin Other (See Comments)    Affects tendons  . Statins     She has tried several and has had issues with them like joint pain  . Celecoxib Rash  . Codeine Rash    Current Outpatient Medications:  .  acetaminophen (TYLENOL) 325 MG tablet, Take 650 mg by mouth every 6 (six) hours as needed for mild pain., Disp: , Rfl:  .  aspirin EC 81 MG tablet, Take 81 mg by mouth daily., Disp: , Rfl:  .  Biotin 5000 MCG CAPS, Take 5,000 mcg by mouth daily., Disp: , Rfl:  .  fexofenadine (ALLEGRA) 180 MG tablet, Take 180 mg by mouth daily., Disp: , Rfl:  .  labetalol (NORMODYNE) 200 MG tablet, TAKE 1  TABLET BY MOUTH 3  TIMES DAILY, Disp: 270 tablet, Rfl: 3 .  Magnesium 250 MG TABS, Take 1 tablet by mouth daily. , Disp: , Rfl:  .  mirabegron ER (MYRBETRIQ) 50 MG TB24 tablet, Take 1 tablet (50 mg total) by mouth daily., Disp: 30 tablet, Rfl: 11 .  Olmesartan-amLODIPine-HCTZ 40-10-25 MG TABS, TAKE 1 TABLET BY MOUTH EVERY DAY, Disp: 90 tablet, Rfl: 3 .  pantoprazole (PROTONIX) 20 MG tablet, Take 1 tablet (20 mg total) by mouth daily., Disp: 90 tablet, Rfl: 1 .  simvastatin (ZOCOR) 20 MG tablet, Take 1 tablet (20 mg total) by mouth daily. Reported on 11/02/2015, Disp: 90 tablet, Rfl: 4 .  sucralfate (CARAFATE) 1 g tablet, Take 1 tablet (1 g total) by mouth 4 (four) times daily -  with meals and at bedtime., Disp: 120 tablet, Rfl: 2 .  torsemide (DEMADEX) 5 MG tablet, Take 5 mg by mouth daily., Disp: , Rfl:  .  oxybutynin (DITROPAN-XL) 10 MG 24 hr tablet, Take 1 tablet (10 mg total) by mouth daily. (Patient not taking: Reported on 10/03/2018), Disp: 30 tablet, Rfl: 11 .  tolterodine (DETROL LA) 4 MG 24 hr capsule, Take 1  capsule (4 mg total) by mouth daily. (Patient not taking: Reported on 10/03/2018), Disp: 30 capsule, Rfl: 11  Review of Systems  Constitutional: Negative for appetite change, chills, fatigue and fever.  Respiratory: Negative for chest tightness and shortness of breath.   Cardiovascular: Negative for chest pain and palpitations.  Gastrointestinal: Negative for abdominal pain, nausea and vomiting.  Neurological: Negative for dizziness and weakness.   Social History   Tobacco Use  . Smoking status: Never Smoker  . Smokeless tobacco: Never Used  Substance Use Topics  . Alcohol use: No     Objective:   BP 124/60 (BP Location: Right Arm, Patient Position: Sitting, Cuff Size: Large)   Pulse (!) 53   Temp 97.8 F (36.6 C) (Oral)   Resp 16   Wt 194 lb (88 kg)   SpO2 98%   BMI 36.66 kg/m  Vitals:   10/14/18 1432  BP: 124/60  Pulse: (!) 53  Resp: 16  Temp: 97.8 F (36.6 C)  TempSrc: Oral  SpO2: 98%  Weight: 194 lb (88 kg)   Physical Exam Constitutional:      General: She is not in acute distress.    Appearance: She is well-developed.  HENT:     Head: Normocephalic and atraumatic.     Right Ear: Hearing normal.     Left Ear: Hearing normal.     Nose: Nose normal.  Eyes:     General: Lids are normal. No scleral icterus.       Right eye: No discharge.        Left eye: No discharge.     Conjunctiva/sclera: Conjunctivae normal.  Pulmonary:     Effort: Pulmonary effort is normal. No respiratory distress.  Musculoskeletal:     Comments: Stiff left 1st MTP joint with great toe deviating laterally causing the 2nd to ride overtop the 1st toe. Callus formation with some erythema to the elevated left 2nd toe medially. Tender to palpate 2nd toe. Slight pinkness dorsum of the left foot at the base of the 2nd,3rd and 4th toes.  Skin:    Findings: No lesion or rash.  Neurological:     Mental Status: She is alert and oriented to person, place, and time.  Psychiatric:        Speech:  Speech normal.  Behavior: Behavior normal.        Thought Content: Thought content normal.       Assessment & Plan    1. Great toe pain, left Recurrence of pain in the left 1st and 2nd toes over the past 4 days. No new injury. Initially injury occurred 2 months ago when she kicked a concrete curb. Some redness this time at the base of her toes in the forefoot. Good pulses bilaterally. Brother has a history of gout. Will get labs and x-ray evaluation. May need referral to a podiatrist. - Uric acid - Sedimentation rate - CBC with Differential/Platelet - DG Foot Complete Left  2. Bunion of great toe of left foot Enlarged and stiff left first MTP joint. Slight erythema and tenderness. Will get x-ray evaluation. May need podiatry referral. - DG Foot Complete Left     Vernie Murders, [facility]

## 2018-10-15 ENCOUNTER — Telehealth: Payer: Self-pay | Admitting: *Deleted

## 2018-10-15 DIAGNOSIS — M79675 Pain in left toe(s): Secondary | ICD-10-CM

## 2018-10-15 DIAGNOSIS — M21612 Bunion of left foot: Secondary | ICD-10-CM

## 2018-10-15 LAB — CBC WITH DIFFERENTIAL/PLATELET
Basophils Absolute: 0.1 10*3/uL (ref 0.0–0.2)
Basos: 1 %
EOS (ABSOLUTE): 0.2 10*3/uL (ref 0.0–0.4)
Eos: 3 %
Hematocrit: 38.5 % (ref 34.0–46.6)
Hemoglobin: 13 g/dL (ref 11.1–15.9)
Immature Grans (Abs): 0 10*3/uL (ref 0.0–0.1)
Immature Granulocytes: 1 %
LYMPHS ABS: 1.6 10*3/uL (ref 0.7–3.1)
Lymphs: 22 %
MCH: 29.7 pg (ref 26.6–33.0)
MCHC: 33.8 g/dL (ref 31.5–35.7)
MCV: 88 fL (ref 79–97)
Monocytes Absolute: 0.4 10*3/uL (ref 0.1–0.9)
Monocytes: 6 %
Neutrophils Absolute: 4.9 10*3/uL (ref 1.4–7.0)
Neutrophils: 67 %
Platelets: 272 10*3/uL (ref 150–450)
RBC: 4.38 x10E6/uL (ref 3.77–5.28)
RDW: 12.2 % (ref 11.7–15.4)
WBC: 7.3 10*3/uL (ref 3.4–10.8)

## 2018-10-15 LAB — SEDIMENTATION RATE: Sed Rate: 18 mm/hr (ref 0–40)

## 2018-10-15 LAB — URIC ACID: Uric Acid: 7 mg/dL (ref 2.5–7.1)

## 2018-10-15 NOTE — Telephone Encounter (Signed)
-----   Message from Margo Common, [place] sent at 10/15/2018  8:28 AM EST ----- Blood tests essentially normal. X-ray did not show and fractures, but, some degeneration in the great toe side of the foot. With inability to use the NSAID's like Ibuprofen and Naproxen, should get referral to a podiatrist.

## 2018-10-15 NOTE — Telephone Encounter (Signed)
Referral in Epic  

## 2018-10-21 ENCOUNTER — Other Ambulatory Visit: Payer: Self-pay | Admitting: Family Medicine

## 2018-10-21 NOTE — Telephone Encounter (Signed)
Mail order pharmacy requesting refills. Thanks!  

## 2018-10-24 ENCOUNTER — Ambulatory Visit: Payer: Medicare Other

## 2018-10-24 ENCOUNTER — Encounter: Payer: Self-pay | Admitting: Podiatry

## 2018-10-24 ENCOUNTER — Ambulatory Visit (INDEPENDENT_AMBULATORY_CARE_PROVIDER_SITE_OTHER): Payer: Medicare Other | Admitting: Podiatry

## 2018-10-24 VITALS — BP 149/67 | HR 60

## 2018-10-24 DIAGNOSIS — M21612 Bunion of left foot: Secondary | ICD-10-CM

## 2018-10-24 DIAGNOSIS — S93602A Unspecified sprain of left foot, initial encounter: Secondary | ICD-10-CM

## 2018-10-24 NOTE — Progress Notes (Signed)
.  .   This patient presents the office for an evaluation of her left forefoot.  She says she injured her foot back in December and then 3 weeks ago  the foot became  red swollen and hot to the touch.  She proceeded to soak her foot in Epson salts and the foot has continued improving since the day of the swelling and pain..  She says she was seen at the [facility] 2 weeks ago and an x-ray was taken and revealed no evidence of any bony pathology.  She presents the office today stating that she is much improved and she is believes she is over 50% improved.  She presents the office today per referral by Vernie Murders.  She presents the office today for an evaluation of this left foot.  Vascular  Dorsalis pedis and posterior tibial pulses are palpable  B/L.  Capillary return  WNL.  Temperature gradient is  WNL.  Skin turgor  WNL  Sensorium  Senn Weinstein monofilament wire  WNL. Normal tactile sensation.  Nail Exam  Patient has normal nails with no evidence of bacterial or fungal infection.  Orthopedic  Exam  Muscle tone and muscle strength  WNL.  No limitations of motion feet  B/L.  No crepitus or joint effusion noted.  Severe HAV 1st MPJ left foot.  Hammering at MPJ second and third toes left foot.  No redness or swelling noted.  Skin  No open lesions.  Normal skin texture and turgor.  S/P Foot Sprain  IE.  X-rays revealed no evidence of any bony pathology through the metatarsals left foot.  There is a hammering noted to the second and third digits left foot.  No evidence of redness swelling or palpable pain noted to the left foot.  Discussed this condition with this patient.  Told her that clinically there is no evidence of any pathology and on x-ray there is no evidence of pathology.  She was told to continue to allow her foot to heal and to return to the office if the problem recurs.  Gardiner Barefoot DPM

## 2018-12-22 ENCOUNTER — Other Ambulatory Visit: Payer: Self-pay | Admitting: Family Medicine

## 2019-01-02 ENCOUNTER — Ambulatory Visit: Payer: Medicare Other | Admitting: Family Medicine

## 2019-01-27 DIAGNOSIS — R600 Localized edema: Secondary | ICD-10-CM | POA: Diagnosis not present

## 2019-01-27 DIAGNOSIS — I10 Essential (primary) hypertension: Secondary | ICD-10-CM | POA: Diagnosis not present

## 2019-01-27 DIAGNOSIS — N1330 Unspecified hydronephrosis: Secondary | ICD-10-CM | POA: Diagnosis not present

## 2019-01-27 DIAGNOSIS — N183 Chronic kidney disease, stage 3 (moderate): Secondary | ICD-10-CM | POA: Diagnosis not present

## 2019-01-27 DIAGNOSIS — N261 Atrophy of kidney (terminal): Secondary | ICD-10-CM | POA: Diagnosis not present

## 2019-01-30 DIAGNOSIS — N261 Atrophy of kidney (terminal): Secondary | ICD-10-CM | POA: Diagnosis not present

## 2019-01-30 DIAGNOSIS — N183 Chronic kidney disease, stage 3 (moderate): Secondary | ICD-10-CM | POA: Diagnosis not present

## 2019-01-30 DIAGNOSIS — N1330 Unspecified hydronephrosis: Secondary | ICD-10-CM | POA: Diagnosis not present

## 2019-01-30 DIAGNOSIS — R600 Localized edema: Secondary | ICD-10-CM | POA: Diagnosis not present

## 2019-03-11 ENCOUNTER — Other Ambulatory Visit: Payer: Self-pay

## 2019-03-11 ENCOUNTER — Ambulatory Visit (INDEPENDENT_AMBULATORY_CARE_PROVIDER_SITE_OTHER): Payer: Medicare Other | Admitting: Family Medicine

## 2019-03-11 ENCOUNTER — Encounter: Payer: Self-pay | Admitting: Family Medicine

## 2019-03-11 VITALS — BP 124/72 | HR 49 | Temp 98.6°F | Resp 16 | Wt 193.4 lb

## 2019-03-11 DIAGNOSIS — I10 Essential (primary) hypertension: Secondary | ICD-10-CM

## 2019-03-11 DIAGNOSIS — G629 Polyneuropathy, unspecified: Secondary | ICD-10-CM | POA: Diagnosis not present

## 2019-03-11 DIAGNOSIS — E785 Hyperlipidemia, unspecified: Secondary | ICD-10-CM | POA: Diagnosis not present

## 2019-03-11 DIAGNOSIS — E538 Deficiency of other specified B group vitamins: Secondary | ICD-10-CM

## 2019-03-11 DIAGNOSIS — E539 Vitamin B deficiency, unspecified: Secondary | ICD-10-CM

## 2019-03-11 DIAGNOSIS — R55 Syncope and collapse: Secondary | ICD-10-CM | POA: Diagnosis not present

## 2019-03-11 NOTE — Progress Notes (Signed)
Patient: Dawn [name] Female    DOB: May 20, 1934   85 y.o.   MRN: [id number] Visit Date: 03/11/2019  Today's Provider: Wilhemena Durie, MD   Chief Complaint  Patient presents with  . Follow-up   Subjective:    6 Month Follow Up. Medication refill on Torsemide. She feels well,no complaints. HPI  Allergies  Allergen Reactions  . Quinine Anaphylaxis and Swelling  . Levofloxacin Other (See Comments)    Affects tendons  . Statins     She has tried several and has had issues with them like joint pain  . Celecoxib Rash  . Codeine Rash     Current Outpatient Medications:  .  acetaminophen (TYLENOL) 325 MG tablet, Take 650 mg by mouth every 6 (six) hours as needed for mild pain., Disp: , Rfl:  .  aspirin EC 81 MG tablet, Take 81 mg by mouth daily., Disp: , Rfl:  .  Biotin 5000 MCG CAPS, Take 5,000 mcg by mouth daily., Disp: , Rfl:  .  fexofenadine (ALLEGRA) 180 MG tablet, Take 180 mg by mouth daily., Disp: , Rfl:  .  Lysine HCl 500 MG CAPS, Take 500 mg by mouth., Disp: , Rfl:  .  Magnesium 250 MG TABS, Take 1 tablet by mouth daily. , Disp: , Rfl:  .  Olmesartan-amLODIPine-HCTZ 40-10-25 MG TABS, TAKE 1 TABLET BY MOUTH EVERY DAY, Disp: 90 tablet, Rfl: 3 .  simvastatin (ZOCOR) 20 MG tablet, TAKE 1 TABLET BY MOUTH  DAILY., Disp: 90 tablet, Rfl: 4 .  torsemide (DEMADEX) 5 MG tablet, Take 5 mg by mouth daily., Disp: , Rfl:  .  labetalol (NORMODYNE) 200 MG tablet, TAKE 1 TABLET BY MOUTH 3  TIMES DAILY, Disp: 270 tablet, Rfl: 3 .  mirabegron ER (MYRBETRIQ) 50 MG TB24 tablet, Take 1 tablet (50 mg total) by mouth daily. (Patient not taking: Reported on 03/11/2019), Disp: 30 tablet, Rfl: 11 .  oxybutynin (DITROPAN-XL) 10 MG 24 hr tablet, Take 1 tablet (10 mg total) by mouth daily. (Patient not taking: Reported on 03/11/2019), Disp: 30 tablet, Rfl: 11 .  pantoprazole (PROTONIX) 20 MG tablet, Take 1 tablet (20 mg total) by mouth daily., Disp: 90 tablet, Rfl: 1 .  sucralfate  (CARAFATE) 1 g tablet, Take 1 tablet (1 g total) by mouth 4 (four) times daily -  with meals and at bedtime. (Patient not taking: Reported on 03/11/2019), Disp: 120 tablet, Rfl: 2 .  tolterodine (DETROL LA) 4 MG 24 hr capsule, Take 1 capsule (4 mg total) by mouth daily. (Patient not taking: Reported on 03/11/2019), Disp: 30 capsule, Rfl: 11  Review of Systems  Constitutional: Negative for appetite change, chills, fatigue and fever.  HENT: Negative.   Eyes: Negative.   Respiratory: Negative for chest tightness and shortness of breath.   Cardiovascular: Negative for chest pain and palpitations.  Gastrointestinal: Negative for abdominal pain, nausea and vomiting.  Endocrine: Negative.   Allergic/Immunologic: Negative.   Neurological: Negative for dizziness and weakness.  Hematological: Negative.   Psychiatric/Behavioral: Negative.   All other systems reviewed and are negative.   Social History   Tobacco Use  . Smoking status: Never Smoker  . Smokeless tobacco: Never Used  Substance Use Topics  . Alcohol use: No      Objective:   BP 124/72 (BP Location: Right Arm, Patient Position: Sitting, Cuff Size: Large)   Pulse (!) 49   Temp 98.6 F (37 C) (Oral)   Resp 16  Wt 193 lb 6.4 oz (87.7 kg)   SpO2 97%   BMI 36.54 kg/m  Vitals:   03/11/19 1344  BP: 124/72  Pulse: (!) 49  Resp: 16  Temp: 98.6 F (37 C)  TempSrc: Oral  SpO2: 97%  Weight: 193 lb 6.4 oz (87.7 kg)     Physical Exam Constitutional:      Appearance: She is well-developed. She is obese.  HENT:     Head: Normocephalic and atraumatic.     Right Ear: External ear normal.     Left Ear: External ear normal.     Nose: Nose normal.  Eyes:     General: No scleral icterus.    Conjunctiva/sclera: Conjunctivae normal.     Pupils: Pupils are equal, round, and reactive to light.  Neck:     Musculoskeletal: Normal range of motion and neck supple.  Cardiovascular:     Rate and Rhythm: Normal rate and regular  rhythm.     Heart sounds: Normal heart sounds.  Pulmonary:     Effort: Pulmonary effort is normal.     Breath sounds: Normal breath sounds.  Musculoskeletal: Normal range of motion.  Skin:    General: Skin is warm and dry.  Neurological:     General: No focal deficit present.     Mental Status: She is alert and oriented to person, place, and time.     Deep Tendon Reflexes: Reflexes are normal and symmetric.  Psychiatric:        Mood and Affect: Mood normal.        Behavior: Behavior normal.        Thought Content: Thought content normal.        Judgment: Judgment normal.      No results found for any visits on 03/11/19.     Assessment & Plan    1. Neuropathy Check for thyroid and B12, pt not diabetic.  2. Essential hypertension Controlled on ARB/CCB/diuretic./BB - Hepatic function panel - TSH  3. Hyperlipidemia, unspecified hyperlipidemia type On zocor. - Lipid Profile - Hepatic function panel  4. Vitamin B deficiency   5. Vitamin B 12 deficiency  - B12 and Folate Panel        Wilhemena Durie, MD  [facility]

## 2019-03-12 DIAGNOSIS — I10 Essential (primary) hypertension: Secondary | ICD-10-CM | POA: Diagnosis not present

## 2019-03-12 DIAGNOSIS — E538 Deficiency of other specified B group vitamins: Secondary | ICD-10-CM | POA: Diagnosis not present

## 2019-03-12 DIAGNOSIS — E785 Hyperlipidemia, unspecified: Secondary | ICD-10-CM | POA: Diagnosis not present

## 2019-03-13 ENCOUNTER — Telehealth: Payer: Self-pay

## 2019-03-13 LAB — HEPATIC FUNCTION PANEL
ALT: 11 IU/L (ref 0–32)
AST: 11 IU/L (ref 0–40)
Albumin: 4.6 g/dL (ref 3.6–4.6)
Alkaline Phosphatase: 82 IU/L (ref 39–117)
Bilirubin Total: 0.5 mg/dL (ref 0.0–1.2)
Bilirubin, Direct: 0.14 mg/dL (ref 0.00–0.40)
Total Protein: 6.3 g/dL (ref 6.0–8.5)

## 2019-03-13 LAB — TSH: TSH: 2.37 u[IU]/mL (ref 0.450–4.500)

## 2019-03-13 LAB — B12 AND FOLATE PANEL
Folate: 15.4 ng/mL (ref >3.0)
Vitamin B-12: 197 pg/mL — ABNORMAL LOW (ref 232–1245)

## 2019-03-13 LAB — LIPID PANEL
Chol/HDL Ratio: 2.9 ratio (ref 0.0–4.4)
Cholesterol, Total: 152 mg/dL (ref 100–199)
HDL: 52 mg/dL (ref >39)
LDL Calculated: 64 mg/dL (ref 0–99)
Triglycerides: 182 mg/dL — ABNORMAL HIGH (ref 0–149)
VLDL Cholesterol Cal: 36 mg/dL (ref 5–40)

## 2019-03-13 NOTE — Telephone Encounter (Signed)
Patient has been advised. KW 

## 2019-03-13 NOTE — Telephone Encounter (Signed)
-----   Message from Jerrol Banana., MD sent at 03/13/2019  3:01 PM EDT ----- Labs ok but B12 deficient--would start B12 weekly for 4 weeks and then monthly

## 2019-03-17 ENCOUNTER — Other Ambulatory Visit: Payer: Self-pay

## 2019-03-17 ENCOUNTER — Ambulatory Visit (INDEPENDENT_AMBULATORY_CARE_PROVIDER_SITE_OTHER): Payer: Medicare Other | Admitting: Family Medicine

## 2019-03-17 DIAGNOSIS — E539 Vitamin B deficiency, unspecified: Secondary | ICD-10-CM | POA: Diagnosis not present

## 2019-03-17 MED ORDER — CYANOCOBALAMIN 1000 MCG/ML IJ SOLN
1000.0000 ug | Freq: Once | INTRAMUSCULAR | Status: AC
  Administered 2019-03-17: 1000 ug via INTRAMUSCULAR

## 2019-03-17 NOTE — Progress Notes (Signed)
Patient was given B12 injection in right deltoid. Patient tolerated well. Return in 1 week.

## 2019-03-24 ENCOUNTER — Other Ambulatory Visit: Payer: Self-pay

## 2019-03-24 ENCOUNTER — Ambulatory Visit (INDEPENDENT_AMBULATORY_CARE_PROVIDER_SITE_OTHER): Payer: Medicare Other | Admitting: Family Medicine

## 2019-03-24 DIAGNOSIS — E539 Vitamin B deficiency, unspecified: Secondary | ICD-10-CM | POA: Diagnosis not present

## 2019-03-24 MED ORDER — CYANOCOBALAMIN 1000 MCG/ML IJ SOLN
1000.0000 ug | Freq: Once | INTRAMUSCULAR | Status: AC
  Administered 2019-03-24: 1000 ug via INTRAMUSCULAR

## 2019-03-24 NOTE — Progress Notes (Signed)
B12 shot only. °

## 2019-03-31 ENCOUNTER — Other Ambulatory Visit: Payer: Self-pay

## 2019-03-31 ENCOUNTER — Ambulatory Visit (INDEPENDENT_AMBULATORY_CARE_PROVIDER_SITE_OTHER): Payer: Medicare Other | Admitting: Family Medicine

## 2019-03-31 DIAGNOSIS — E538 Deficiency of other specified B group vitamins: Secondary | ICD-10-CM | POA: Diagnosis not present

## 2019-03-31 MED ORDER — CYANOCOBALAMIN 1000 MCG/ML IJ SOLN
1000.0000 ug | Freq: Once | INTRAMUSCULAR | Status: AC
  Administered 2019-03-31: 1000 ug via INTRAMUSCULAR

## 2019-04-02 NOTE — Progress Notes (Signed)
Shot only 

## 2019-04-07 ENCOUNTER — Other Ambulatory Visit: Payer: Self-pay

## 2019-04-07 ENCOUNTER — Ambulatory Visit (INDEPENDENT_AMBULATORY_CARE_PROVIDER_SITE_OTHER): Payer: Medicare Other | Admitting: Family Medicine

## 2019-04-07 DIAGNOSIS — E538 Deficiency of other specified B group vitamins: Secondary | ICD-10-CM

## 2019-04-07 MED ORDER — CYANOCOBALAMIN 1000 MCG/ML IJ SOLN
1000.0000 ug | Freq: Once | INTRAMUSCULAR | Status: AC
  Administered 2019-04-07: 1000 ug via INTRAMUSCULAR

## 2019-04-07 NOTE — Progress Notes (Signed)
B12 shot only. °

## 2019-05-08 ENCOUNTER — Ambulatory Visit (INDEPENDENT_AMBULATORY_CARE_PROVIDER_SITE_OTHER): Payer: Medicare Other | Admitting: Family Medicine

## 2019-05-08 ENCOUNTER — Other Ambulatory Visit: Payer: Self-pay

## 2019-05-08 DIAGNOSIS — E538 Deficiency of other specified B group vitamins: Secondary | ICD-10-CM

## 2019-05-11 NOTE — Progress Notes (Signed)
B12 shot only. °

## 2019-06-05 DIAGNOSIS — H6123 Impacted cerumen, bilateral: Secondary | ICD-10-CM | POA: Diagnosis not present

## 2019-06-05 DIAGNOSIS — H900 Conductive hearing loss, bilateral: Secondary | ICD-10-CM | POA: Diagnosis not present

## 2019-06-11 ENCOUNTER — Other Ambulatory Visit: Payer: Self-pay

## 2019-06-11 ENCOUNTER — Ambulatory Visit (INDEPENDENT_AMBULATORY_CARE_PROVIDER_SITE_OTHER): Payer: Medicare Other | Admitting: Family Medicine

## 2019-06-11 DIAGNOSIS — Z23 Encounter for immunization: Secondary | ICD-10-CM

## 2019-06-11 DIAGNOSIS — E538 Deficiency of other specified B group vitamins: Secondary | ICD-10-CM | POA: Diagnosis not present

## 2019-06-11 MED ORDER — CYANOCOBALAMIN 1000 MCG/ML IJ SOLN
1000.0000 ug | Freq: Once | INTRAMUSCULAR | Status: AC
  Administered 2019-06-11: 1000 ug via INTRAMUSCULAR

## 2019-06-15 NOTE — Progress Notes (Signed)
Shot only 

## 2019-07-07 NOTE — Progress Notes (Signed)
Subjective:   Dawn [name] is a 85 y.o. female who presents for Medicare Annual (Subsequent) preventive examination.    This visit is being conducted through telemedicine due to the COVID-19 pandemic. This patient has given me verbal consent via doximity to conduct this visit, patient states they are participating from their home address. Some vital signs may be absent or patient reported.    Patient identification: identified by name, DOB, and current address  Review of Systems:  N/A  Cardiac Risk Factors include: advanced age (>32men, >72 women);dyslipidemia;hypertension;obesity (BMI >30kg/m2)     Objective:     Vitals: BP (!) 124/48 (BP Location: Right Arm)   Pulse 62   Temp 98 F (36.7 C) (Oral)   Ht 5\' 1"  (1.549 m)   Wt 195 lb 3.2 oz (88.5 kg)   BMI 36.88 kg/m   Body mass index is 36.88 kg/m. Unable to obtain vitals due to visit being conducted via telephonically.   Advanced Directives 07/08/2019 07/02/2018 06/28/2017 02/13/2017 09/05/2016 07/18/2016 01/13/2016  Does Patient Have a Medical Advance Directive? No No No No No No No  Would patient like information on creating a medical advance directive? Yes (ED - Information included in AVS) No - Patient declined No - Patient declined No - Patient declined - No - patient declined information -    Tobacco Social History   Tobacco Use  Smoking Status Never Smoker  Smokeless Tobacco Never Used     Counseling given: Not Answered   Clinical Intake:  Pre-visit preparation completed: Yes  Pain : 0-10 Pain Score: 5  Pain Type: Chronic pain Pain Location: Back Pain Orientation: Lower Pain Descriptors / Indicators: Aching Pain Frequency: Constant     Nutritional Status: BMI > 30  Obese Nutritional Risks: None Diabetes: No  How often do you need to have someone help you when you read instructions, pamphlets, or other written materials from your doctor or pharmacy?: 1 - Never  Interpreter Needed?: No   Information entered by :: [facility], LPN  Past Medical History:  Diagnosis Date  . Acid reflux   . Arthritis   . Back pain   . Gastric polyp 2014  . Hypertension   . Stomach ulcer   . Stroke [facility])    Past Surgical History:  Procedure Laterality Date  . BACK SURGERY  2011   vertebroplasty   . CARDIAC CATHETERIZATION  2007   normal, [employer] [facility] [place]   . CARPAL TUNNEL RELEASE  2007  . cataract surgery   2013  . COLON SURGERY  2015   lap sigmoid resection  . COLONOSCOPY  2011, 2015   Dr. Nicolasa Ducking, Dr Melody Comas  . LAMINECTOMY  2011   decompression   . UPPER GI ENDOSCOPY  2011  . VASCULAR SURGERY Right 2010   right rental artery    Family History  Problem Relation Age of Onset  . Cancer Father        stomach  . Emphysema Father   . Hypertension Mother   . Atrial fibrillation Mother   . Diabetes Sister   . Hypertension Sister   . Skin cancer Sister   . Hypertension Brother   . Heart disease Brother   . Heart disease Sister   . Colon polyps Sister   . Hypertension Sister   . Thyroid disease Sister   . Irritable bowel syndrome Sister   . Breast cancer Sister   . Cancer Sister        breast  .  Heart disease Brother   . Colon polyps Brother   . Cancer Brother        stomach  . Diabetes Brother   . Hypertension Brother   . Neuropathy Brother   . Post-traumatic stress disorder Brother   . Hypertension Brother   . Pneumonia Brother   . Breast cancer Paternal Aunt   . Breast cancer Paternal Grandmother   . Breast cancer Paternal Aunt   . Breast cancer Paternal Aunt   . Breast cancer Paternal Aunt   . Breast cancer Other    Social History   Socioeconomic History  . Marital status: Divorced    Spouse name: none  . Number of children: 1  . Years of education: 41  . Highest education level: Some college, no degree  Occupational History  . Occupation: retired  Scientific laboratory technician  . Financial resource strain: Not hard at all  . Food insecurity    Worry:  Never true    Inability: Never true  . Transportation needs    Medical: No    Non-medical: No  Tobacco Use  . Smoking status: Never Smoker  . Smokeless tobacco: Never Used  Substance and Sexual Activity  . Alcohol use: No  . Drug use: No  . Sexual activity: Never  Lifestyle  . Physical activity    Days per week: 0 days    Minutes per session: 0 min  . Stress: Not at all  Relationships  . Social Herbalist on phone: Patient refused    Gets together: Patient refused    Attends religious service: Patient refused    Active member of club or organization: Patient refused    Attends meetings of clubs or organizations: Patient refused    Relationship status: Patient refused  Other Topics Concern  . Not on file  Social History Narrative  . Not on file    Outpatient Encounter Medications as of 07/08/2019  Medication Sig  . acetaminophen (TYLENOL) 325 MG tablet Take 650 mg by mouth every 6 (six) hours as needed for mild pain.  [name] aspirin EC 81 MG tablet Take 81 mg by mouth daily.  . Biotin 5000 MCG CAPS Take 5,000 mcg by mouth daily.  . fexofenadine (ALLEGRA) 180 MG tablet Take 180 mg by mouth daily.  [name] labetalol (NORMODYNE) 200 MG tablet TAKE 1 TABLET BY MOUTH 3  TIMES DAILY  . Lysine HCl 500 MG CAPS Take 500 mg by mouth.  . Magnesium 250 MG TABS Take 1 tablet by mouth daily.   . Olmesartan-amLODIPine-HCTZ 40-10-25 MG TABS TAKE 1 TABLET BY MOUTH EVERY DAY  . pantoprazole (PROTONIX) 20 MG tablet Take 1 tablet (20 mg total) by mouth daily.  . simvastatin (ZOCOR) 20 MG tablet TAKE 1 TABLET BY MOUTH  DAILY.  [name] torsemide (DEMADEX) 5 MG tablet Take 5 mg by mouth daily. As needed with leg swelling  . mirabegron ER (MYRBETRIQ) 50 MG TB24 tablet Take 1 tablet (50 mg total) by mouth daily. (Patient not taking: Reported on 03/11/2019)  . oxybutynin (DITROPAN-XL) 10 MG 24 hr tablet Take 1 tablet (10 mg total) by mouth daily. (Patient not taking: Reported on 03/11/2019)  . sucralfate  (CARAFATE) 1 g tablet Take 1 tablet (1 g total) by mouth 4 (four) times daily -  with meals and at bedtime. (Patient not taking: Reported on 03/11/2019)  . tolterodine (DETROL LA) 4 MG 24 hr capsule Take 1 capsule (4 mg total) by mouth daily. (Patient not taking:  Reported on 03/11/2019)   No facility-administered encounter medications on file as of 07/08/2019.     Activities of Daily Living In your present state of health, do you have any difficulty performing the following activities: 07/08/2019  Hearing? Y  Comment Does not wear hearing aids.  Vision? N  Difficulty concentrating or making decisions? N  Walking or climbing stairs? Y  Comment Due to back pains.  Dressing or bathing? N  Doing errands, shopping? Y  Comment Due to stability and SOB.  Preparing Food and eating ? N  Using the Toilet? N  In the past six months, have you accidently leaked urine? Y  Comment Due to incontinence.  Do you have problems with loss of bowel control? N  Managing your Medications? N  Managing your Finances? N  Housekeeping or managing your Housekeeping? N  Some recent data might be hidden    Patient Care Team: Jerrol Banana., MD as PCP - General (Unknown Physician Specialty) Leandrew Koyanagi, MD as Referring Physician (Ophthalmology) Sharlet Salina, MD as Referring Physician (Physical Medicine and Rehabilitation) Murlean Iba, MD (Nephrology) Corey Skains, MD as Consulting Physician (Cardiology) Jonathon Bellows, MD as Consulting Physician (Gastroenterology)    Assessment:   This is a routine wellness examination for Dawn [name].  Exercise Activities and Dietary recommendations Current Exercise Habits: The patient does not participate in regular exercise at present, Exercise limited by: respiratory conditions(s)  Goals    . Eat more fruits and vegetables     Recommend increasing amount of fruits and vegetables in daily diet to 2 servings a day of each.     . Reduce caffeine  intake     Recommend to decrease the amount of caffeinated  drinks to 2 a day versus 5 a day and increase water intake.        Fall Risk: Fall Risk  07/08/2019 07/08/2019 07/02/2018 06/28/2017 06/15/2016  Falls in the past year? 0 0 No No Yes  Number falls in past yr: 0 0 - - 1  Injury with Fall? 0 0 - - No  Risk for fall due to : - - - - -    FALL RISK PREVENTION PERTAINING TO THE HOME:  Any stairs in or around the home? Yes If so, are there any without handrails? No   Home free of loose throw rugs in walkways, pet beds, electrical cords, etc? Yes  Adequate lighting in your home to reduce risk of falls? Yes   ASSISTIVE DEVICES UTILIZED TO PREVENT FALLS:  Life alert? No  Use of a cane, walker or w/c? Yes  Grab bars in the bathroom? No  Shower chair or bench in shower? Yes  Elevated toilet seat or a handicapped toilet? Yes    TIMED UP AND GO:  Was the test performed? No .    Depression Screen PHQ 2/9 Scores 07/08/2019 07/02/2018 06/28/2017 06/15/2016  PHQ - 2 Score 1 1 2  0  PHQ- 9 Score - - 4 -     Cognitive Function: Declined today.      6CIT Screen 06/28/2017  What Year? 0 points  What month? 0 points  What time? 0 points  Count back from 20 0 points  Months in reverse 0 points  Repeat phrase 2 points  Total Score 2    Immunization History  Administered Date(s) Administered  . Fluad Quad(high Dose 65+) 06/11/2019  . Influenza, High Dose Seasonal PF 06/16/2015, 06/29/2016, 06/28/2017, 07/02/2018  . Influenza,inj,quad, With Preservative 09/11/2016  .  Pneumococcal Conjugate-13 02/17/2015  . Pneumococcal Polysaccharide-23 08/14/2012  . Pneumococcal-Unspecified 09/11/2016    Qualifies for Shingles Vaccine? Yes . Due for Shingrix. Education has been provided regarding the importance of this vaccine. Pt has been advised to call insurance company to determine out of pocket expense. Advised may also receive vaccine at local pharmacy or Health Dept. Verbalized  acceptance and understanding.  Tdap: Up to date  Flu Vaccine: Up to date  Pneumococcal Vaccine: Completed series  Screening Tests Health Maintenance  Topic Date Due  . TETANUS/TDAP  01/10/2023  . INFLUENZA VACCINE  Completed  . DEXA SCAN  Completed  . PNA vac Low Risk Adult  Completed    Cancer Screenings:  Colorectal Screening: No longer required.   Mammogram: No longer required.   Bone Density: Completed 03/25/04. Results reflect NORMAL. No repeat needed unless advised by a physician.   Lung Cancer Screening: (Low Dose CT Chest recommended if Age 5-80 years, 30 pack-year currently smoking OR have quit w/in 15years.) does not qualify.   Additional Screening:  Vision Screening: Recommended annual ophthalmology exams for early detection of glaucoma and other disorders of the eye.  Dental Screening: Recommended annual dental exams for proper oral hygiene  Community Resource Referral:  CRR required this visit?  No       Plan:  I have personally reviewed and addressed the Medicare Annual Wellness questionnaire and have noted the following in the patient's chart:  A. Medical and social history B. Use of alcohol, tobacco or illicit drugs  C. Current medications and supplements D. Functional ability and status E.  Nutritional status F.  Physical activity G. Advance directives H. List of other physicians I.  Hospitalizations, surgeries, and ER visits in previous 12 months J.  [place] such as hearing and vision if needed, cognitive and depression L. Referrals and appointments   In addition, I have reviewed and discussed with patient certain preventive protocols, quality metrics, and best practice recommendations. A written personalized care plan for preventive services as well as general preventive health recommendations were provided to patient. Nurse Health Advisor  Signed,    Jacorian Golaszewski [place], [place]  [id number] Nurse Health Advisor   Nurse Notes:  None.

## 2019-07-07 NOTE — Progress Notes (Signed)
Patient: Dawn [name] Female    DOB: Jul 10, 1934   85 y.o.   MRN: [id number] Visit Date: 07/08/2019  Today's Provider: Wilhemena Durie, MD   Chief Complaint  Patient presents with  . Follow-up   Subjective:    Patient had AWV with [facility] today at 9:20 pm.  HPI   Neuropathy From 03/11/2019-labs checked. Labs ok but B12 deficient--would start B12 weekly for 4 weeks and then monthly.  Essential hypertension From 03/11/2019-Controlled on ARB/CCB/diuretic./BB. Labs ok but B12 deficient--would start B12 weekly for 4 weeks and then monthly.  Hyperlipidemia, unspecified hyperlipidemia type From 03/11/2019-On zocor. Labs ok but B12 deficient--would start B12 weekly for 4 weeks and then monthly.  Vitamin B deficiency From 03/11/2019- Labs ok but B12 deficient--would start B12 weekly for 4 weeks and then monthly.    Allergies  Allergen Reactions  . Quinine Anaphylaxis and Swelling  . Levofloxacin Other (See Comments)    Affects tendons  . Statins     She has tried several and has had issues with them like joint pain  . Celecoxib Rash  . Codeine Rash     Current Outpatient Medications:  .  acetaminophen (TYLENOL) 325 MG tablet, Take 650 mg by mouth every 6 (six) hours as needed for mild pain., Disp: , Rfl:  .  aspirin EC 81 MG tablet, Take 81 mg by mouth daily., Disp: , Rfl:  .  Biotin 5000 MCG CAPS, Take 5,000 mcg by mouth daily., Disp: , Rfl:  .  fexofenadine (ALLEGRA) 180 MG tablet, Take 180 mg by mouth daily., Disp: , Rfl:  .  labetalol (NORMODYNE) 200 MG tablet, TAKE 1 TABLET BY MOUTH 3  TIMES DAILY, Disp: 270 tablet, Rfl: 3 .  Lysine HCl 500 MG CAPS, Take 500 mg by mouth., Disp: , Rfl:  .  Magnesium 250 MG TABS, Take 1 tablet by mouth daily. , Disp: , Rfl:  .  Olmesartan-amLODIPine-HCTZ 40-10-25 MG TABS, TAKE 1 TABLET BY MOUTH EVERY DAY, Disp: 90 tablet, Rfl: 3 .  pantoprazole (PROTONIX) 20 MG tablet, Take 1 tablet (20 mg total) by mouth daily., Disp: 90  tablet, Rfl: 1 .  simvastatin (ZOCOR) 20 MG tablet, TAKE 1 TABLET BY MOUTH  DAILY., Disp: 90 tablet, Rfl: 4 .  sucralfate (CARAFATE) 1 g tablet, Take 1 tablet (1 g total) by mouth 4 (four) times daily -  with meals and at bedtime., Disp: 120 tablet, Rfl: 2 .  tolterodine (DETROL LA) 4 MG 24 hr capsule, Take 1 capsule (4 mg total) by mouth daily., Disp: 30 capsule, Rfl: 11 .  torsemide (DEMADEX) 5 MG tablet, Take 5 mg by mouth daily. As needed with leg swelling, Disp: , Rfl:  .  mirabegron ER (MYRBETRIQ) 50 MG TB24 tablet, Take 1 tablet (50 mg total) by mouth daily. (Patient not taking: Reported on 03/11/2019), Disp: 30 tablet, Rfl: 11 .  oxybutynin (DITROPAN-XL) 10 MG 24 hr tablet, Take 1 tablet (10 mg total) by mouth daily. (Patient not taking: Reported on 03/11/2019), Disp: 30 tablet, Rfl: 11  Review of Systems  Constitutional: Negative.   HENT: Positive for sinus pressure and tinnitus.   Eyes: Negative.   Respiratory: Positive for cough and shortness of breath.   Endocrine: Negative.   Genitourinary: Positive for enuresis.  Musculoskeletal: Positive for arthralgias, back pain and myalgias.  Allergic/Immunologic: Negative.   Neurological: Positive for light-headedness, numbness and headaches.  Hematological: Bruises/bleeds easily.  Psychiatric/Behavioral: Negative.     Social  History   Tobacco Use  . Smoking status: Never Smoker  . Smokeless tobacco: Never Used  Substance Use Topics  . Alcohol use: No      Objective:   Vitals:   BP 124/48 (BP Location: Right Arm)  Pulse 62  Temp 98 F (36.7 C) (Oral)  Ht 5\' 1"  (1.549 m)  Wt 195 lb 3.2 oz (88.5 kg)  BMI 36.88 kg/m  BSA 1.95 m     Physical Exam Vitals signs reviewed.  Constitutional:      Appearance: She is well-developed.  HENT:     Head: Normocephalic and atraumatic.  Eyes:     Conjunctiva/sclera: Conjunctivae normal.  Neck:     Thyroid: No thyromegaly.  Cardiovascular:     Rate and Rhythm: Normal rate and  regular rhythm.     Heart sounds: Normal heart sounds.  Pulmonary:     Effort: Pulmonary effort is normal.     Breath sounds: Normal breath sounds.  Chest:     Breasts:        Right: Normal.        Left: Normal.  Abdominal:     Palpations: Abdomen is soft.  Lymphadenopathy:     Cervical: No cervical adenopathy.  Skin:    General: Skin is warm and dry.  Neurological:     Mental Status: She is alert and oriented to person, place, and time.  Psychiatric:        Behavior: Behavior normal.        Thought Content: Thought content normal.      No results found for any visits on 07/08/19.     Assessment & Plan     1. Essential hypertension Controlled on labetalol, olmesartan-amlodipine-HCT, - Comprehensive Metabolic Panel (CMET)  2. Hyperlipidemia, unspecified hyperlipidemia type On simvastatin 20 - Comprehensive Metabolic Panel (CMET)  3. Vitamin B 12 deficiency Change from subcu to p.o. B12 - Comprehensive Metabolic Panel (CMET) - cyanocobalamin ((VITAMIN B-12)) injection 1,000 mcg  4. Hypercalcemia  - Comprehensive Metabolic Panel (CMET)  5. Chronic low back pain without sciatica, unspecified back pain laterality Patient has had several low back surgeries  6. Class 2 severe obesity due to excess calories with serious comorbidity and body mass index (BMI) of 36.0 to 36.9 in adult [facility]) With hypertension hyperlipidemia and chronic low back pain     Follow up in 6 months.   I,[name],acting as a scribe for Wilhemena Durie, MD.,have documented all relevant documentation on the behalf of Wilhemena Durie, MD,as directed by  Wilhemena Durie, MD while in the presence of Wilhemena Durie, MD.   Wilhemena Durie, MD  [facility]

## 2019-07-08 ENCOUNTER — Ambulatory Visit (INDEPENDENT_AMBULATORY_CARE_PROVIDER_SITE_OTHER): Payer: Medicare Other | Admitting: Family Medicine

## 2019-07-08 ENCOUNTER — Ambulatory Visit (INDEPENDENT_AMBULATORY_CARE_PROVIDER_SITE_OTHER): Payer: Medicare Other

## 2019-07-08 ENCOUNTER — Other Ambulatory Visit: Payer: Self-pay

## 2019-07-08 ENCOUNTER — Encounter: Payer: Self-pay | Admitting: Family Medicine

## 2019-07-08 VITALS — BP 124/48 | HR 62 | Temp 98.0°F | Ht 61.0 in | Wt 195.2 lb

## 2019-07-08 DIAGNOSIS — G8929 Other chronic pain: Secondary | ICD-10-CM

## 2019-07-08 DIAGNOSIS — I10 Essential (primary) hypertension: Secondary | ICD-10-CM | POA: Diagnosis not present

## 2019-07-08 DIAGNOSIS — M545 Low back pain, unspecified: Secondary | ICD-10-CM

## 2019-07-08 DIAGNOSIS — E785 Hyperlipidemia, unspecified: Secondary | ICD-10-CM | POA: Diagnosis not present

## 2019-07-08 DIAGNOSIS — E538 Deficiency of other specified B group vitamins: Secondary | ICD-10-CM | POA: Diagnosis not present

## 2019-07-08 DIAGNOSIS — Z0000 Encounter for general adult medical examination without abnormal findings: Secondary | ICD-10-CM

## 2019-07-08 DIAGNOSIS — Z6836 Body mass index (BMI) 36.0-36.9, adult: Secondary | ICD-10-CM

## 2019-07-08 MED ORDER — CYANOCOBALAMIN 1000 MCG/ML IJ SOLN
1000.0000 ug | Freq: Once | INTRAMUSCULAR | Status: AC
  Administered 2019-07-08: 1000 ug via INTRAMUSCULAR

## 2019-07-08 NOTE — Patient Instructions (Signed)
Vitamin B 12 deficiency Start over-the-counter Vitamin B12 tablets once daily. Follow up in 6 months.

## 2019-07-08 NOTE — Patient Instructions (Addendum)
Ms. Dawn [name] , Thank you for taking time to come for your Medicare Wellness Visit. I appreciate your ongoing commitment to your health goals. Please review the following plan we discussed and let me know if I can assist you in the future.   Screening recommendations/referrals: Colonoscopy: No longer required.  Mammogram: No longer required.  Bone Density: Up to date, previous DEXA normal. No repeat needed unless advised by a physician. Recommended yearly ophthalmology/optometry visit for glaucoma screening and checkup Recommended yearly dental visit for hygiene and checkup  Vaccinations: Influenza vaccine: Up to date Pneumococcal vaccine: Completed series Tdap vaccine: Up to date, due 01/2023 Shingles vaccine: Pt declines today.     Advanced directives: Advance directive discussed with you today. I have provide d a copy for you to complete at home and have notarized. Once this is complete please bring a copy in to our office so we can scan it into your chart.  Conditions/risks identified: Continue to work towards decreasing caffeinated drinks to a maximum of 2 a day and increasing water intake.   Next appointment: 3:00 PM today with Dr Rosanna Randy. Declined scheduling an AWV and CPE for 2021, at this time.    Preventive Care 40 Years and Older, Female Preventive care refers to lifestyle choices and visits with your health care provider that can promote health and wellness. What does preventive care include?  A yearly physical exam. This is also called an annual well check.  Dental exams once or twice a year.  Routine eye exams. Ask your health care provider how often you should have your eyes checked.  Personal lifestyle choices, including:  Daily care of your teeth and gums.  Regular physical activity.  Eating a healthy diet.  Avoiding tobacco and drug use.  Limiting alcohol use.  Practicing safe sex.  Taking low-dose aspirin every day.  Taking vitamin and mineral  supplements as recommended by your health care provider. What happens during an annual well check? The services and screenings done by your health care provider during your annual well check will depend on your age, overall health, lifestyle risk factors, and family history of disease. Counseling  Your health care provider may ask you questions about your:  Alcohol use.  Tobacco use.  Drug use.  Emotional well-being.  Home and relationship well-being.  Sexual activity.  Eating habits.  History of falls.  Memory and ability to understand (cognition).  Work and work Statistician.  Reproductive health. Screening  You may have the following tests or measurements:  Height, weight, and BMI.  Blood pressure.  Lipid and cholesterol levels. These may be checked every 5 years, or more frequently if you are over 36 years old.  Skin check.  Lung cancer screening. You may have this screening every year starting at age 25 if you have a 30-pack-year history of smoking and currently smoke or have quit within the past 15 years.  Fecal occult blood test (FOBT) of the stool. You may have this test every year starting at age 76.  Flexible sigmoidoscopy or colonoscopy. You may have a sigmoidoscopy every 5 years or a colonoscopy every 10 years starting at age 73.  Hepatitis C blood test.  Hepatitis B blood test.  Sexually transmitted disease (STD) testing.  Diabetes screening. This is done by checking your blood sugar (glucose) after you have not eaten for a while (fasting). You may have this done every 1-3 years.  Bone density scan. This is done to screen for osteoporosis. You may have  this done starting at age 61.  Mammogram. This may be done every 1-2 years. Talk to your health care provider about how often you should have regular mammograms. Talk with your health care provider about your test results, treatment options, and if necessary, the need for more tests. Vaccines  Your  health care provider may recommend certain vaccines, such as:  Influenza vaccine. This is recommended every year.  Tetanus, diphtheria, and acellular pertussis (Tdap, Td) vaccine. You may need a Td booster every 10 years.  Zoster vaccine. You may need this after age 90.  Pneumococcal 13-valent conjugate (PCV13) vaccine. One dose is recommended after age 14.  Pneumococcal polysaccharide (PPSV23) vaccine. One dose is recommended after age 19. Talk to your health care provider about which screenings and vaccines you need and how often you need them. This information is not intended to replace advice given to you by your health care provider. Make sure you discuss any questions you have with your health care provider. Document Released: 09/24/2015 Document Revised: 05/17/2016 Document Reviewed: 06/29/2015 Elsevier Interactive Patient Education  2017 [place] Prevention in the Home Falls can cause injuries. They can happen to people of all ages. There are many things you can do to make your home safe and to help prevent falls. What can I do on the outside of my home?  Regularly fix the edges of walkways and driveways and fix any cracks.  Remove anything that might make you trip as you walk through a door, such as a raised step or threshold.  Trim any bushes or trees on the path to your home.  Use bright outdoor lighting.  Clear any walking paths of anything that might make someone trip, such as rocks or tools.  Regularly check to see if handrails are loose or broken. Make sure that both sides of any steps have handrails.  Any raised decks and porches should have guardrails on the edges.  Have any leaves, snow, or ice cleared regularly.  Use sand or salt on walking paths during winter.  Clean up any spills in your garage right away. This includes oil or grease spills. What can I do in the bathroom?  Use night lights.  Install grab bars by the toilet and in the tub and  shower. Do not use towel bars as grab bars.  Use non-skid mats or decals in the tub or shower.  If you need to sit down in the shower, use a plastic, non-slip stool.  Keep the floor dry. Clean up any water that spills on the floor as soon as it happens.  Remove soap buildup in the tub or shower regularly.  Attach bath mats securely with double-sided non-slip rug tape.  Do not have throw rugs and other things on the floor that can make you trip. What can I do in the bedroom?  Use night lights.  Make sure that you have a light by your bed that is easy to reach.  Do not use any sheets or blankets that are too big for your bed. They should not hang down onto the floor.  Have a firm chair that has side arms. You can use this for support while you get dressed.  Do not have throw rugs and other things on the floor that can make you trip. What can I do in the kitchen?  Clean up any spills right away.  Avoid walking on wet floors.  Keep items that you use a lot in easy-to-reach  places.  If you need to reach something above you, use a strong step stool that has a grab bar.  Keep electrical cords out of the way.  Do not use floor polish or wax that makes floors slippery. If you must use wax, use non-skid floor wax.  Do not have throw rugs and other things on the floor that can make you trip. What can I do with my stairs?  Do not leave any items on the stairs.  Make sure that there are handrails on both sides of the stairs and use them. Fix handrails that are broken or loose. Make sure that handrails are as long as the stairways.  Check any carpeting to make sure that it is firmly attached to the stairs. Fix any carpet that is loose or worn.  Avoid having throw rugs at the top or bottom of the stairs. If you do have throw rugs, attach them to the floor with carpet tape.  Make sure that you have a light switch at the top of the stairs and the bottom of the stairs. If you do not  have them, ask someone to add them for you. What else can I do to help prevent falls?  Wear shoes that:  Do not have high heels.  Have rubber bottoms.  Are comfortable and fit you well.  Are closed at the toe. Do not wear sandals.  If you use a stepladder:  Make sure that it is fully opened. Do not climb a closed stepladder.  Make sure that both sides of the stepladder are locked into place.  Ask someone to hold it for you, if possible.  Clearly mark and make sure that you can see:  Any grab bars or handrails.  First and last steps.  Where the edge of each step is.  Use tools that help you move around (mobility aids) if they are needed. These include:  Canes.  Walkers.  Scooters.  Crutches.  Turn on the lights when you go into a dark area. Replace any light bulbs as soon as they burn out.  Set up your furniture so you have a clear path. Avoid moving your furniture around.  If any of your floors are uneven, fix them.  If there are any pets around you, be aware of where they are.  Review your medicines with your doctor. Some medicines can make you feel dizzy. This can increase your chance of falling. Ask your doctor what other things that you can do to help prevent falls. This information is not intended to replace advice given to you by your health care provider. Make sure you discuss any questions you have with your health care provider. Document Released: 06/24/2009 Document Revised: 02/03/2016 Document Reviewed: 10/02/2014 Elsevier Interactive Patient Education  2017 [employer].

## 2019-07-09 ENCOUNTER — Ambulatory Visit: Payer: Self-pay | Admitting: Family Medicine

## 2019-07-09 LAB — COMPREHENSIVE METABOLIC PANEL
ALT: 13 IU/L (ref 0–32)
AST: 15 IU/L (ref 0–40)
Albumin/Globulin Ratio: 2.4 — ABNORMAL HIGH (ref 1.2–2.2)
Albumin: 4.7 g/dL — ABNORMAL HIGH (ref 3.6–4.6)
Alkaline Phosphatase: 90 IU/L (ref 39–117)
BUN/Creatinine Ratio: 16 (ref 12–28)
BUN: 21 mg/dL (ref 8–27)
Bilirubin Total: 0.4 mg/dL (ref 0.0–1.2)
CO2: 22 mmol/L (ref 20–29)
Calcium: 9.9 mg/dL (ref 8.7–10.3)
Chloride: 91 mmol/L — ABNORMAL LOW (ref 96–106)
Creatinine, Ser: 1.32 mg/dL — ABNORMAL HIGH (ref 0.57–1.00)
GFR calc Af Amer: 43 mL/min/{1.73_m2} — ABNORMAL LOW (ref >59)
GFR calc non Af Amer: 37 mL/min/{1.73_m2} — ABNORMAL LOW (ref >59)
Globulin, Total: 2 g/dL (ref 1.5–4.5)
Glucose: 94 mg/dL (ref 65–99)
Potassium: 4.3 mmol/L (ref 3.5–5.2)
Sodium: 128 mmol/L — ABNORMAL LOW (ref 134–144)
Total Protein: 6.7 g/dL (ref 6.0–8.5)

## 2019-07-29 DIAGNOSIS — N261 Atrophy of kidney (terminal): Secondary | ICD-10-CM | POA: Diagnosis not present

## 2019-07-29 DIAGNOSIS — N183 Chronic kidney disease, stage 3 unspecified: Secondary | ICD-10-CM | POA: Diagnosis not present

## 2019-07-29 DIAGNOSIS — R600 Localized edema: Secondary | ICD-10-CM | POA: Diagnosis not present

## 2019-07-29 DIAGNOSIS — N1330 Unspecified hydronephrosis: Secondary | ICD-10-CM | POA: Diagnosis not present

## 2019-07-29 DIAGNOSIS — I10 Essential (primary) hypertension: Secondary | ICD-10-CM | POA: Diagnosis not present

## 2019-08-05 DIAGNOSIS — I10 Essential (primary) hypertension: Secondary | ICD-10-CM | POA: Diagnosis not present

## 2019-08-05 DIAGNOSIS — N1832 Chronic kidney disease, stage 3b: Secondary | ICD-10-CM | POA: Diagnosis not present

## 2019-08-05 DIAGNOSIS — N1330 Unspecified hydronephrosis: Secondary | ICD-10-CM | POA: Diagnosis not present

## 2019-08-05 DIAGNOSIS — N261 Atrophy of kidney (terminal): Secondary | ICD-10-CM | POA: Diagnosis not present

## 2019-08-05 DIAGNOSIS — R600 Localized edema: Secondary | ICD-10-CM | POA: Diagnosis not present

## 2019-09-12 ENCOUNTER — Other Ambulatory Visit: Payer: Self-pay | Admitting: Family Medicine

## 2019-09-25 DIAGNOSIS — H26492 Other secondary cataract, left eye: Secondary | ICD-10-CM | POA: Diagnosis not present

## 2019-10-29 DIAGNOSIS — S62316A Displaced fracture of base of fifth metacarpal bone, right hand, initial encounter for closed fracture: Secondary | ICD-10-CM | POA: Diagnosis not present

## 2019-10-29 DIAGNOSIS — S62367D Nondisplaced fracture of neck of fifth metacarpal bone, left hand, subsequent encounter for fracture with routine healing: Secondary | ICD-10-CM | POA: Diagnosis not present

## 2019-11-05 DIAGNOSIS — S62316A Displaced fracture of base of fifth metacarpal bone, right hand, initial encounter for closed fracture: Secondary | ICD-10-CM | POA: Diagnosis not present

## 2019-11-05 DIAGNOSIS — S62367D Nondisplaced fracture of neck of fifth metacarpal bone, left hand, subsequent encounter for fracture with routine healing: Secondary | ICD-10-CM | POA: Diagnosis not present

## 2019-11-17 DIAGNOSIS — S62367D Nondisplaced fracture of neck of fifth metacarpal bone, left hand, subsequent encounter for fracture with routine healing: Secondary | ICD-10-CM | POA: Diagnosis not present

## 2019-11-17 DIAGNOSIS — S62316A Displaced fracture of base of fifth metacarpal bone, right hand, initial encounter for closed fracture: Secondary | ICD-10-CM | POA: Diagnosis not present

## 2019-12-02 ENCOUNTER — Other Ambulatory Visit: Payer: Self-pay | Admitting: Family Medicine

## 2019-12-02 NOTE — Telephone Encounter (Signed)
Requested Prescriptions  Pending Prescriptions Disp Refills  . simvastatin (ZOCOR) 20 MG tablet [Pharmacy Med Name: SIMVASTATIN  20MG   TAB] 90 tablet 1    Sig: TAKE 1 TABLET BY MOUTH  DAILY     Cardiovascular:  Antilipid - Statins Failed - 12/02/2019  9:30 PM      Failed - Triglycerides in normal range and within 360 days    Triglycerides  Date Value Ref Range Status  03/12/2019 182 (H) 0 - 149 mg/dL Final  06/08/2013 178 0 - 200 mg/dL Final         Passed - Total Cholesterol in normal range and within 360 days    Cholesterol, Total  Date Value Ref Range Status  03/12/2019 152 100 - 199 mg/dL Final   Cholesterol  Date Value Ref Range Status  06/08/2013 167 0 - 200 mg/dL Final         Passed - LDL in normal range and within 360 days    Ldl Cholesterol, Calc  Date Value Ref Range Status  06/08/2013 90 0 - 100 mg/dL Final   LDL Calculated  Date Value Ref Range Status  03/12/2019 64 0 - 99 mg/dL Final         Passed - HDL in normal range and within 360 days    HDL Cholesterol  Date Value Ref Range Status  06/08/2013 41 40 - 60 mg/dL Final   HDL  Date Value Ref Range Status  03/12/2019 52 >39 mg/dL Final         Passed - Patient is not pregnant      Passed - Valid encounter within last 12 months    Recent Outpatient Visits          4 months ago Essential hypertension   [facility] Jerrol Banana., MD   5 months ago Need for immunization against influenza   [facility] Jerrol Banana., MD   6 months ago Vitamin B 12 deficiency   [facility] Jerrol Banana., MD   7 months ago Vitamin B 12 deficiency   [facility] Jerrol Banana., MD   8 months ago Vitamin B 12 deficiency   [facility] Jerrol Banana., MD      Future Appointments            In 1 month Jerrol Banana., MD [facility] [place]

## 2019-12-08 DIAGNOSIS — S62316D Displaced fracture of base of fifth metacarpal bone, right hand, subsequent encounter for fracture with routine healing: Secondary | ICD-10-CM | POA: Diagnosis not present

## 2019-12-08 DIAGNOSIS — S62367D Nondisplaced fracture of neck of fifth metacarpal bone, left hand, subsequent encounter for fracture with routine healing: Secondary | ICD-10-CM | POA: Diagnosis not present

## 2019-12-15 ENCOUNTER — Other Ambulatory Visit: Payer: Self-pay | Admitting: Family Medicine

## 2019-12-15 DIAGNOSIS — S62316A Displaced fracture of base of fifth metacarpal bone, right hand, initial encounter for closed fracture: Secondary | ICD-10-CM | POA: Diagnosis not present

## 2019-12-15 DIAGNOSIS — S62367D Nondisplaced fracture of neck of fifth metacarpal bone, left hand, subsequent encounter for fracture with routine healing: Secondary | ICD-10-CM | POA: Diagnosis not present

## 2019-12-22 DIAGNOSIS — S62316A Displaced fracture of base of fifth metacarpal bone, right hand, initial encounter for closed fracture: Secondary | ICD-10-CM | POA: Diagnosis not present

## 2019-12-22 DIAGNOSIS — S62367D Nondisplaced fracture of neck of fifth metacarpal bone, left hand, subsequent encounter for fracture with routine healing: Secondary | ICD-10-CM | POA: Diagnosis not present

## 2019-12-29 DIAGNOSIS — S62316A Displaced fracture of base of fifth metacarpal bone, right hand, initial encounter for closed fracture: Secondary | ICD-10-CM | POA: Diagnosis not present

## 2019-12-29 DIAGNOSIS — S62367D Nondisplaced fracture of neck of fifth metacarpal bone, left hand, subsequent encounter for fracture with routine healing: Secondary | ICD-10-CM | POA: Diagnosis not present

## 2020-01-02 NOTE — Progress Notes (Signed)
Established patient visit   Patient: Dawn [name]   DOB: [birth year]   86 y.o. Female  MRN: [id number] Visit Date: 01/06/2020  Today's healthcare provider: Wilhemena Durie, MD   Chief Complaint  Patient presents with  . Hypertension  . Hyperlipidemia  . VITAMIN B12 DEFICIENCY   Subjective    HPI Patient fell on February 16 of this year hitting her head but unfortunately she broke both hands.  She was in a cast until recently for both hands and wrist.  She is now seeing occupational therapy trying to get her full strength and range of motion back.  Other than that she is actually feeling well. Hypertension, follow-up  BP Readings from Last 3 Encounters:  01/06/20 140/64  07/08/19 (!) 124/48  03/11/19 124/72   She was last seen for hypertension 03/11/2019.  BP at that visit was 124/725. Management since that visit includes; controlled. She reports good compliance with treatment. She is not having side effects.  She is not exercising. She is not adherent to low salt diet.   Outside blood pressures are not checked.  She does not smoke.  Use of agents associated with hypertension: none.   ----------------------------------------------------------------------------------------- Lipid/Cholesterol, follow-up  Last Lipid Panel: Lab Results  Component Value Date   CHOL 152 03/12/2019   LDLCALC 64 03/12/2019   HDL 52 03/12/2019   TRIG 182 (H) 03/12/2019   ALT 13 07/08/2019   AST 15 07/08/2019   PLT 272 10/14/2018    She was last seen for this 03/11/2019.  Management since that visit includes, on Zocor.  She reports good compliance with treatment. She is not having side effects.  She is following a Regular diet. Current exercise: none  Wt Readings from Last 3 Encounters:  01/06/20 191 lb 12.8 oz (87 kg)  07/08/19 195 lb 3.2 oz (88.5 kg)  03/11/19 193 lb 6.4 oz (87.7 kg)   Last metabolic panel Lab Results  Component Value Date   GLUCOSE 94 07/08/2019   NA 128 (L) 07/08/2019   K 4.3 07/08/2019   BUN 21 07/08/2019   CREATININE 1.32 (H) 07/08/2019   GFRNONAA 37 (L) 07/08/2019   GFRAA 43 (L) 07/08/2019   CALCIUM 9.9 07/08/2019   AST 15 07/08/2019   ALT 13 07/08/2019   The ASCVD Risk score (Goff DC Jr., et al., 2013) failed to calculate for the following reasons:   The 2013 ASCVD risk score is only valid for ages 53 to 19  -----------------------------------------------------------------------------------------      Medications: Outpatient Medications Prior to Visit  Medication Sig  . acetaminophen (TYLENOL) 325 MG tablet Take 650 mg by mouth every 6 (six) hours as needed for mild pain.  [name] aspirin EC 81 MG tablet Take 81 mg by mouth daily.  . Biotin 5000 MCG CAPS Take 5,000 mcg by mouth daily.  . fexofenadine (ALLEGRA) 180 MG tablet Take 180 mg by mouth daily.  [name] labetalol (NORMODYNE) 200 MG tablet TAKE 1 TABLET BY MOUTH 3  TIMES DAILY  . Lysine HCl 500 MG CAPS Take 500 mg by mouth.  . Magnesium 250 MG TABS Take 1 tablet by mouth daily.   . Olmesartan-amLODIPine-HCTZ 40-10-25 MG TABS TAKE 1 TABLET BY MOUTH EVERY DAY  . simvastatin (ZOCOR) 20 MG tablet TAKE 1 TABLET BY MOUTH  DAILY  . torsemide (DEMADEX) 5 MG tablet Take 5 mg by mouth daily. As needed with leg swelling  . vitamin B-12 (CYANOCOBALAMIN) 1000 MCG tablet Take 1,000 mcg by mouth  daily.  . mirabegron ER (MYRBETRIQ) 50 MG TB24 tablet Take 1 tablet (50 mg total) by mouth daily. (Patient not taking: Reported on 03/11/2019)  . oxybutynin (DITROPAN-XL) 10 MG 24 hr tablet Take 1 tablet (10 mg total) by mouth daily. (Patient not taking: Reported on 03/11/2019)  . pantoprazole (PROTONIX) 20 MG tablet Take 1 tablet (20 mg total) by mouth daily.  . sucralfate (CARAFATE) 1 g tablet Take 1 tablet (1 g total) by mouth 4 (four) times daily -  with meals and at bedtime. (Patient not taking: Reported on 01/06/2020)  . tolterodine (DETROL LA) 4 MG 24 hr capsule Take 1 capsule (4 mg total) by  mouth daily. (Patient not taking: Reported on 01/06/2020)   No facility-administered medications prior to visit.    Review of Systems  Constitutional: Negative for appetite change, chills, fatigue and fever.  HENT: Negative.   Eyes: Negative.   Respiratory: Negative for chest tightness and shortness of breath.   Cardiovascular: Negative for chest pain and palpitations.  Gastrointestinal: Negative for abdominal pain, nausea and vomiting.  Endocrine: Negative.   Musculoskeletal: Positive for arthralgias.  Allergic/Immunologic: Negative.   Neurological: Negative for dizziness and weakness.  Psychiatric/Behavioral: Negative.        Objective    BP 140/64 (BP Location: Left Arm, Patient Position: Sitting, Cuff Size: Large)   Pulse 89   Temp (!) 96.9 F (36.1 C) (Temporal)   Ht 5\' 1"  (1.549 m)   Wt 191 lb 12.8 oz (87 kg)   SpO2 99%   BMI 36.24 kg/m    Physical Exam Vitals reviewed.  Constitutional:      Appearance: She is well-developed.  HENT:     Head: Normocephalic and atraumatic.     Right Ear: External ear normal.     Left Ear: External ear normal.  Eyes:     General: No scleral icterus.    Conjunctiva/sclera: Conjunctivae normal.  Neck:     Thyroid: No thyromegaly.  Cardiovascular:     Rate and Rhythm: Normal rate and regular rhythm.     Heart sounds: Normal heart sounds.  Pulmonary:     Effort: Pulmonary effort is normal.     Breath sounds: Normal breath sounds.  Abdominal:     Palpations: Abdomen is soft.  Lymphadenopathy:     Cervical: No cervical adenopathy.  Skin:    General: Skin is warm and dry.  Neurological:     Mental Status: She is alert and oriented to person, place, and time.     Comments: She has very minimal left arm weakness since her CVA.  She  claims no residual issues with her left leg  Psychiatric:        Mood and Affect: Mood normal.        Behavior: Behavior normal.        Thought Content: Thought content normal.        Judgment:  Judgment normal.       No results found for any visits on 01/06/20.  Assessment & Plan    1. Vitamin B 12 deficiency Check B12 level.  She is now switched to p.o. B12 after having parenteral for a while - CBC with Differential/Platelet - Comprehensive metabolic panel - TSH - Lipid panel - Vitamin B12  2. Hypercholesteremia On simvastatin - CBC with Differential/Platelet - Comprehensive metabolic panel - TSH - Lipid panel - Vitamin B12  3. Essential hypertension Controlled on olmesartan amlodipine - CBC with Differential/Platelet - Comprehensive metabolic panel -  TSH - Lipid panel - Vitamin B12  4. CVA, old, hemiparesis ([place]) Minimal left upper extremity weakness residual  5. Class 2 severe obesity due to excess calories with serious comorbidity and body mass index (BMI) of 36.0 to 36.9 in adult ([place])   6. Neuropathy Stable.   No follow-ups on file.         Daryl Beehler Cranford Mon, MD  [facility] [phone] (phone) [phone] (fax)  [place]

## 2020-01-06 ENCOUNTER — Other Ambulatory Visit: Payer: Self-pay

## 2020-01-06 ENCOUNTER — Encounter: Payer: Self-pay | Admitting: Family Medicine

## 2020-01-06 ENCOUNTER — Ambulatory Visit (INDEPENDENT_AMBULATORY_CARE_PROVIDER_SITE_OTHER): Payer: Medicare Other | Admitting: Family Medicine

## 2020-01-06 VITALS — BP 140/64 | HR 89 | Temp 96.9°F | Ht 61.0 in | Wt 191.8 lb

## 2020-01-06 DIAGNOSIS — E7800 Pure hypercholesterolemia, unspecified: Secondary | ICD-10-CM | POA: Diagnosis not present

## 2020-01-06 DIAGNOSIS — E538 Deficiency of other specified B group vitamins: Secondary | ICD-10-CM | POA: Diagnosis not present

## 2020-01-06 DIAGNOSIS — Z6836 Body mass index (BMI) 36.0-36.9, adult: Secondary | ICD-10-CM | POA: Diagnosis not present

## 2020-01-06 DIAGNOSIS — G629 Polyneuropathy, unspecified: Secondary | ICD-10-CM

## 2020-01-06 DIAGNOSIS — I69359 Hemiplegia and hemiparesis following cerebral infarction affecting unspecified side: Secondary | ICD-10-CM

## 2020-01-06 DIAGNOSIS — I10 Essential (primary) hypertension: Secondary | ICD-10-CM | POA: Diagnosis not present

## 2020-01-07 LAB — COMPREHENSIVE METABOLIC PANEL
ALT: 10 IU/L (ref 0–32)
AST: 16 IU/L (ref 0–40)
Albumin/Globulin Ratio: 2.4 — ABNORMAL HIGH (ref 1.2–2.2)
Albumin: 4.5 g/dL (ref 3.6–4.6)
Alkaline Phosphatase: 88 IU/L (ref 39–117)
BUN/Creatinine Ratio: 16 (ref 12–28)
BUN: 21 mg/dL (ref 8–27)
Bilirubin Total: 0.4 mg/dL (ref 0.0–1.2)
CO2: 20 mmol/L (ref 20–29)
Calcium: 9.8 mg/dL (ref 8.7–10.3)
Chloride: 96 mmol/L (ref 96–106)
Creatinine, Ser: 1.32 mg/dL — ABNORMAL HIGH (ref 0.57–1.00)
GFR calc Af Amer: 43 mL/min/{1.73_m2} — ABNORMAL LOW (ref >59)
GFR calc non Af Amer: 37 mL/min/{1.73_m2} — ABNORMAL LOW (ref >59)
Globulin, Total: 1.9 g/dL (ref 1.5–4.5)
Glucose: 86 mg/dL (ref 65–99)
Potassium: 4.2 mmol/L (ref 3.5–5.2)
Sodium: 133 mmol/L — ABNORMAL LOW (ref 134–144)
Total Protein: 6.4 g/dL (ref 6.0–8.5)

## 2020-01-07 LAB — LIPID PANEL
Chol/HDL Ratio: 3 ratio (ref 0.0–4.4)
Cholesterol, Total: 157 mg/dL (ref 100–199)
HDL: 52 mg/dL (ref >39)
LDL Chol Calc (NIH): 80 mg/dL (ref 0–99)
Triglycerides: 143 mg/dL (ref 0–149)
VLDL Cholesterol Cal: 25 mg/dL (ref 5–40)

## 2020-01-07 LAB — CBC WITH DIFFERENTIAL/PLATELET
Basophils Absolute: 0.1 10*3/uL (ref 0.0–0.2)
Basos: 1 %
EOS (ABSOLUTE): 0.4 10*3/uL (ref 0.0–0.4)
Eos: 5 %
Hematocrit: 36.9 % (ref 34.0–46.6)
Hemoglobin: 12.5 g/dL (ref 11.1–15.9)
Immature Grans (Abs): 0.1 10*3/uL (ref 0.0–0.1)
Immature Granulocytes: 1 %
Lymphocytes Absolute: 1.7 10*3/uL (ref 0.7–3.1)
Lymphs: 23 %
MCH: 30.2 pg (ref 26.6–33.0)
MCHC: 33.9 g/dL (ref 31.5–35.7)
MCV: 89 fL (ref 79–97)
Monocytes Absolute: 0.5 10*3/uL (ref 0.1–0.9)
Monocytes: 7 %
Neutrophils Absolute: 4.8 10*3/uL (ref 1.4–7.0)
Neutrophils: 63 %
Platelets: 266 10*3/uL (ref 150–450)
RBC: 4.14 x10E6/uL (ref 3.77–5.28)
RDW: 13 % (ref 11.7–15.4)
WBC: 7.6 10*3/uL (ref 3.4–10.8)

## 2020-01-07 LAB — TSH: TSH: 2 u[IU]/mL (ref 0.450–4.500)

## 2020-01-07 LAB — VITAMIN B12: Vitamin B-12: 1676 pg/mL — ABNORMAL HIGH (ref 232–1245)

## 2020-01-09 ENCOUNTER — Telehealth: Payer: Self-pay

## 2020-01-09 NOTE — Telephone Encounter (Signed)
-----   Message from Jerrol Banana., MD sent at 01/09/2020  8:32 AM EDT ----- Labs all stable.

## 2020-01-09 NOTE — Telephone Encounter (Signed)
Patient advised.

## 2020-01-17 DIAGNOSIS — Z23 Encounter for immunization: Secondary | ICD-10-CM | POA: Diagnosis not present

## 2020-01-20 DIAGNOSIS — N1832 Chronic kidney disease, stage 3b: Secondary | ICD-10-CM | POA: Diagnosis not present

## 2020-01-26 DIAGNOSIS — N1832 Chronic kidney disease, stage 3b: Secondary | ICD-10-CM | POA: Diagnosis not present

## 2020-01-26 DIAGNOSIS — N261 Atrophy of kidney (terminal): Secondary | ICD-10-CM | POA: Diagnosis not present

## 2020-01-26 DIAGNOSIS — N183 Chronic kidney disease, stage 3 unspecified: Secondary | ICD-10-CM | POA: Insufficient documentation

## 2020-01-26 DIAGNOSIS — N1330 Unspecified hydronephrosis: Secondary | ICD-10-CM | POA: Insufficient documentation

## 2020-01-26 DIAGNOSIS — R600 Localized edema: Secondary | ICD-10-CM | POA: Diagnosis not present

## 2020-01-26 DIAGNOSIS — I10 Essential (primary) hypertension: Secondary | ICD-10-CM | POA: Diagnosis not present

## 2020-02-15 DIAGNOSIS — Z23 Encounter for immunization: Secondary | ICD-10-CM | POA: Diagnosis not present

## 2020-02-21 ENCOUNTER — Other Ambulatory Visit: Payer: Self-pay | Admitting: Family Medicine

## 2020-02-21 NOTE — Telephone Encounter (Signed)
Requested Prescriptions  Pending Prescriptions Disp Refills  . labetalol (NORMODYNE) 200 MG tablet [Pharmacy Med Name: LABETALOL  200MG   TAB] 270 tablet 1    Sig: TAKE 1 TABLET BY MOUTH 3  TIMES DAILY     Cardiovascular:  Beta Blockers Failed - 02/21/2020  9:55 PM      Failed - Last BP in normal range    BP Readings from Last 1 Encounters:  01/06/20 140/64         Passed - Last Heart Rate in normal range    Pulse Readings from Last 1 Encounters:  01/06/20 89         Passed - Valid encounter within last 6 months    Recent Outpatient Visits          1 month ago Vitamin B 12 deficiency   [facility] Jerrol Banana., MD   7 months ago Essential hypertension   [facility] Jerrol Banana., MD   8 months ago Need for immunization against influenza   [facility] Jerrol Banana., MD   9 months ago Vitamin B 12 deficiency   [facility] Jerrol Banana., MD   10 months ago Vitamin B 12 deficiency   [facility] Jerrol Banana., MD

## 2020-03-30 DIAGNOSIS — H26492 Other secondary cataract, left eye: Secondary | ICD-10-CM | POA: Diagnosis not present

## 2020-04-13 DIAGNOSIS — I10 Essential (primary) hypertension: Secondary | ICD-10-CM | POA: Diagnosis not present

## 2020-04-13 DIAGNOSIS — R42 Dizziness and giddiness: Secondary | ICD-10-CM | POA: Insufficient documentation

## 2020-04-13 DIAGNOSIS — R0602 Shortness of breath: Secondary | ICD-10-CM | POA: Diagnosis not present

## 2020-04-13 DIAGNOSIS — E7800 Pure hypercholesterolemia, unspecified: Secondary | ICD-10-CM | POA: Diagnosis not present

## 2020-04-13 DIAGNOSIS — I6523 Occlusion and stenosis of bilateral carotid arteries: Secondary | ICD-10-CM | POA: Diagnosis not present

## 2020-05-12 ENCOUNTER — Other Ambulatory Visit: Payer: Self-pay | Admitting: Family Medicine

## 2020-05-20 DIAGNOSIS — H26492 Other secondary cataract, left eye: Secondary | ICD-10-CM | POA: Diagnosis not present

## 2020-06-12 ENCOUNTER — Other Ambulatory Visit: Payer: Self-pay | Admitting: Family Medicine

## 2020-07-07 ENCOUNTER — Ambulatory Visit (INDEPENDENT_AMBULATORY_CARE_PROVIDER_SITE_OTHER): Payer: Medicare Other | Admitting: Family Medicine

## 2020-07-07 ENCOUNTER — Encounter: Payer: Self-pay | Admitting: Family Medicine

## 2020-07-07 ENCOUNTER — Other Ambulatory Visit: Payer: Self-pay

## 2020-07-07 VITALS — BP 129/72 | HR 60 | Temp 98.6°F | Resp 20 | Ht 61.0 in | Wt 187.0 lb

## 2020-07-07 DIAGNOSIS — E7800 Pure hypercholesterolemia, unspecified: Secondary | ICD-10-CM

## 2020-07-07 DIAGNOSIS — E538 Deficiency of other specified B group vitamins: Secondary | ICD-10-CM | POA: Diagnosis not present

## 2020-07-07 DIAGNOSIS — M1710 Unilateral primary osteoarthritis, unspecified knee: Secondary | ICD-10-CM | POA: Diagnosis not present

## 2020-07-07 DIAGNOSIS — I6529 Occlusion and stenosis of unspecified carotid artery: Secondary | ICD-10-CM | POA: Diagnosis not present

## 2020-07-07 DIAGNOSIS — I639 Cerebral infarction, unspecified: Secondary | ICD-10-CM | POA: Diagnosis not present

## 2020-07-07 DIAGNOSIS — I10 Essential (primary) hypertension: Secondary | ICD-10-CM

## 2020-07-07 DIAGNOSIS — Z6835 Body mass index (BMI) 35.0-35.9, adult: Secondary | ICD-10-CM

## 2020-07-07 DIAGNOSIS — Z23 Encounter for immunization: Secondary | ICD-10-CM | POA: Diagnosis not present

## 2020-07-07 DIAGNOSIS — E66812 Obesity, class 2: Secondary | ICD-10-CM

## 2020-07-07 NOTE — Progress Notes (Signed)
Annual Wellness Visit     Patient: Dawn [name], Female    DOB: Aug 29, 1934, 86 y.o.   MRN: [id number] Visit Date: 07/07/2020  Today's Provider: Wilhemena Durie, MD   No chief complaint on file.  Subjective    Dawn [name] is a 86 y.o. female who presents today for her Annual Wellness Visit. She reports consuming a general diet. The patient does not participate in regular exercise at present. She generally feels well. She reports sleeping well. She does have additional problems to discuss today.  She she has mild symptoms of overactive bladder and also stress incontinence. She also has a mild chronic dermatitis. Hypertension, follow-up  BP Readings from Last 3 Encounters:  07/07/20 129/72  01/06/20 140/64  07/08/19 (!) 124/48   Wt Readings from Last 3 Encounters:  07/07/20 187 lb (84.8 kg)  01/06/20 191 lb 12.8 oz (87 kg)  07/08/19 195 lb 3.2 oz (88.5 kg)     She was last seen for hypertension 6 months ago.  BP at that visit was 140/64. Management since that visit includes no changes.  She reports good compliance with treatment. She is not having side effects.  She is following a Regular diet. She is not exercising. She does not smoke.  Use of agents associated with hypertension: none.   Outside blood pressures are checked occasionally. Symptoms: No chest pain No chest pressure  No palpitations No syncope  No dyspnea No orthopnea  No paroxysmal nocturnal dyspnea Yes lower extremity edema   Pertinent labs: Lab Results  Component Value Date   CHOL 157 01/06/2020   HDL 52 01/06/2020   LDLCALC 80 01/06/2020   TRIG 143 01/06/2020   CHOLHDL 3.0 01/06/2020   Lab Results  Component Value Date   NA 133 (L) 01/06/2020   K 4.2 01/06/2020   CREATININE 1.32 (H) 01/06/2020   GFRNONAA 37 (L) 01/06/2020   GFRAA 43 (L) 01/06/2020   GLUCOSE 86 01/06/2020     The ASCVD Risk score (Goff DC Jr., et al., 2013) failed to calculate for the following  reasons:   The 2013 ASCVD risk score is only valid for ages 55 to 38    Lipid/Cholesterol, Follow-up  Last lipid panel Other pertinent labs  Lab Results  Component Value Date   CHOL 157 01/06/2020   HDL 52 01/06/2020   LDLCALC 80 01/06/2020   TRIG 143 01/06/2020   CHOLHDL 3.0 01/06/2020   Lab Results  Component Value Date   ALT 10 01/06/2020   AST 16 01/06/2020   PLT 266 01/06/2020   TSH 2.000 01/06/2020     She was last seen for this 6 months ago.  Management since that visit includes no changes.  She reports good compliance with treatment. She is not having side effects.   Follow up for Vit B12 deficiency    The patient was last seen for this 6 months ago. Changes made at last visit include no changes.  She reports good compliance with treatment. She feels that condition is Unchanged. She is not having side effects.       Medications: Outpatient Medications Prior to Visit  Medication Sig  . aspirin EC 81 MG tablet Take 81 mg by mouth daily.  . Biotin 5000 MCG CAPS Take 5,000 mcg by mouth daily.  . fexofenadine (ALLEGRA) 180 MG tablet Take 180 mg by mouth daily.  [name] labetalol (NORMODYNE) 200 MG tablet TAKE 1 TABLET BY MOUTH 3  TIMES DAILY  .  Lysine HCl 500 MG CAPS Take 500 mg by mouth.  . Magnesium 250 MG TABS Take 1 tablet by mouth daily.   . Olmesartan-amLODIPine-HCTZ 40-10-25 MG TABS TAKE 1 TABLET BY MOUTH EVERY DAY  . simvastatin (ZOCOR) 20 MG tablet TAKE 1 TABLET BY MOUTH  DAILY  . torsemide (DEMADEX) 5 MG tablet Take 5 mg by mouth daily. As needed with leg swelling  . vitamin B-12 (CYANOCOBALAMIN) 1000 MCG tablet Take 1,000 mcg by mouth daily.  [name] acetaminophen (TYLENOL) 325 MG tablet Take 650 mg by mouth every 6 (six) hours as needed for mild pain.  . mirabegron ER (MYRBETRIQ) 50 MG TB24 tablet Take 1 tablet (50 mg total) by mouth daily. (Patient not taking: Reported on 07/07/2020)  . oxybutynin (DITROPAN-XL) 10 MG 24 hr tablet Take 1 tablet (10 mg total) by  mouth daily. (Patient not taking: Reported on 03/11/2019)  . pantoprazole (PROTONIX) 20 MG tablet Take 1 tablet (20 mg total) by mouth daily.  . sucralfate (CARAFATE) 1 g tablet Take 1 tablet (1 g total) by mouth 4 (four) times daily -  with meals and at bedtime. (Patient not taking: Reported on 01/06/2020)  . tolterodine (DETROL LA) 4 MG 24 hr capsule Take 1 capsule (4 mg total) by mouth daily. (Patient not taking: Reported on 01/06/2020)   No facility-administered medications prior to visit.    Allergies  Allergen Reactions  . Quinine Anaphylaxis and Swelling  . Levofloxacin Other (See Comments)    Affects tendons  . Statins     She has tried several and has had issues with them like joint pain  . Celecoxib Rash  . Codeine Rash    Patient Care Team: Jerrol Banana., MD as PCP - General (Unknown Physician Specialty) Leandrew Koyanagi, MD as Referring Physician (Ophthalmology) Sharlet Salina, MD as Referring Physician (Physical Medicine and Rehabilitation) Murlean Iba, MD (Nephrology) Corey Skains, MD as Consulting Physician (Cardiology) Jonathon Bellows, MD as Consulting Physician (Gastroenterology)  Review of Systems  Constitutional: Negative.   HENT: Negative.   Eyes: Negative.   Respiratory: Negative.   Cardiovascular: Negative.   Gastrointestinal: Negative.   Endocrine: Negative.   Genitourinary: Negative.   Musculoskeletal: Negative.   Skin: Negative.   Allergic/Immunologic: Negative.   Neurological: Negative.   Hematological: Negative.   Psychiatric/Behavioral: Negative.        Objective    Vitals: BP 129/72   Pulse 60   Temp 98.6 F (37 C)   Resp 20   Ht 5\' 1"  (1.549 m)   Wt 187 lb (84.8 kg)   BMI 35.33 kg/m  BP Readings from Last 3 Encounters:  07/07/20 129/72  01/06/20 140/64  07/08/19 (!) 124/48   Wt Readings from Last 3 Encounters:  07/07/20 187 lb (84.8 kg)  01/06/20 191 lb 12.8 oz (87 kg)  07/08/19 195 lb 3.2 oz (88.5 kg)       Physical Exam Vitals reviewed.  Constitutional:      Appearance: She is well-developed.  HENT:     Head: Normocephalic and atraumatic.     Right Ear: External ear normal.     Left Ear: External ear normal.  Eyes:     General: No scleral icterus.    Conjunctiva/sclera: Conjunctivae normal.  Neck:     Thyroid: No thyromegaly.  Cardiovascular:     Rate and Rhythm: Normal rate and regular rhythm.     Heart sounds: Normal heart sounds.  Pulmonary:     Effort: Pulmonary effort is  normal.     Breath sounds: Normal breath sounds.  Abdominal:     Palpations: Abdomen is soft.  Lymphadenopathy:     Cervical: No cervical adenopathy.  Skin:    General: Skin is warm and dry.  Neurological:     Mental Status: She is alert and oriented to person, place, and time.     Comments: She has very minimal left arm weakness since her CVA.  She  claims no residual issues with her left leg  Psychiatric:        Mood and Affect: Mood normal.        Behavior: Behavior normal.        Thought Content: Thought content normal.        Judgment: Judgment normal.      Most recent functional status assessment: In your present state of health, do you have any difficulty performing the following activities: 07/07/2020  Hearing? Y  Comment -  Vision? N  Difficulty concentrating or making decisions? N  Walking or climbing stairs? Y  Comment -  Dressing or bathing? N  Doing errands, shopping? N  Comment -  Preparing Food and eating ? -  Using the Toilet? -  In the past six months, have you accidently leaked urine? -  Comment -  Do you have problems with loss of bowel control? -  Managing your Medications? -  Managing your Finances? -  Housekeeping or managing your Housekeeping? -  Some recent data might be hidden   Most recent fall risk assessment: Fall Risk  07/08/2019  Falls in the past year? 0  Number falls in past yr: 0  Injury with Fall? 0  Risk for fall due to : -    Most recent  depression screenings: PHQ 2/9 Scores 07/07/2020 07/08/2019  PHQ - 2 Score 0 1  PHQ- 9 Score - -   Most recent cognitive screening: 6CIT Screen 07/07/2020  What Year? 0 points  What month? 0 points  What time? 0 points  Count back from 20 0 points  Months in reverse 0 points  Repeat phrase 0 points  Total Score 0   Most recent Audit-C alcohol use screening Alcohol Use Disorder Test (AUDIT) 07/07/2020  1. How often do you have a drink containing alcohol? 0  2. How many drinks containing alcohol do you have on a typical day when you are drinking? 0  3. How often do you have six or more drinks on one occasion? 0  AUDIT-C Score 0  Alcohol Brief Interventions/Follow-up AUDIT Score <7 follow-up not indicated   A score of 3 or more in women, and 4 or more in men indicates increased risk for alcohol abuse, EXCEPT if all of the points are from question 1   No results found for any visits on 07/07/20.  Assessment & Plan     Annual wellness visit done today including the all of the following: Reviewed patient's Family Medical History Reviewed and updated list of patient's medical providers Assessment of cognitive impairment was done Assessed patient's functional ability Established a written schedule for health screening [place] Completed and Reviewed  Exercise Activities and Dietary recommendations Goals    . Eat more fruits and vegetables     Recommend increasing amount of fruits and vegetables in daily diet to 2 servings a day of each.     . Reduce caffeine intake     Recommend to decrease the amount of caffeinated  drinks to 2 a  day versus 5 a day and increase water intake.        Immunization History  Administered Date(s) Administered  . Fluad Quad(high Dose 65+) 06/11/2019  . Influenza, High Dose Seasonal PF 06/16/2015, 06/29/2016, 06/28/2017, 07/02/2018  . Influenza,inj,quad, With Preservative 09/11/2016  . Pneumococcal Conjugate-13 02/17/2015    . Pneumococcal Polysaccharide-23 08/14/2012  . Pneumococcal-Unspecified 09/11/2016    Health Maintenance  Topic Date Due  . COVID-19 Vaccine (1) Never done  . INFLUENZA VACCINE  04/11/2020  . TETANUS/TDAP  01/10/2023  . DEXA SCAN  Completed  . PNA vac Low Risk Adult  Completed     Discussed health benefits of physical activity, and encouraged her to engage in regular exercise appropriate for her age and condition.   1. Need for immunization against influenza  - Flu Vaccine QUAD High Dose(Fluad)  2. Essential hypertension Good control.  3. Hypercholesteremia On Zocor for years  4. Vitamin B 12 deficiency   5. Stenosis of carotid artery, unspecified laterality   6. Class 2 severe obesity due to excess calories with serious comorbidity and body mass index (BMI) of 35.0 to 35.9 in adult [employer]) With hypertension hyperlipidemia and arthritis  7. Cerebral infarction, unspecified mechanism ([place]) Minimal residual defect  8. Primary osteoarthritis of knee, unspecified laterality    No follow-ups on file.        Beza Steppe Cranford Mon, MD  [facility] [phone] (phone) [phone] (fax)  [place]

## 2020-07-12 NOTE — Progress Notes (Signed)
Subjective:   Dawn [name] is a 86 y.o. female who presents for Medicare Annual (Subsequent) preventive examination.  I connected with Dawn [name] today by telephone and verified that I am speaking with the correct person using two identifiers. Location patient: home Location provider: work Persons participating in the virtual visit: patient, provider.   I discussed the limitations, risks, security and privacy concerns of performing an evaluation and management service by telephone and the availability of in person appointments. I also discussed with the patient that there may be a patient responsible charge related to this service. The patient expressed understanding and verbally consented to this telephonic visit.    Interactive audio and video telecommunications were attempted between this provider and patient, however failed, due to patient having technical difficulties OR patient did not have access to video capability.  We continued and completed visit with audio only.   Review of Systems    N/A  Cardiac Risk Factors include: advanced age (>48men, >31 women);dyslipidemia;hypertension;obesity (BMI >30kg/m2)     Objective:    Today's Vitals   07/13/20 1431  PainSc: 3    There is no height or weight on file to calculate BMI.  Advanced Directives 07/13/2020 07/08/2019 07/02/2018 06/28/2017 02/13/2017 09/05/2016 07/18/2016  Does Patient Have a Medical Advance Directive? No No No No No No No  Would patient like information on creating a medical advance directive? No - Patient declined Yes (ED - Information included in AVS) No - Patient declined No - Patient declined No - Patient declined - No - patient declined information    Current Medications (verified) Outpatient Encounter Medications as of 07/13/2020  Medication Sig  . acetaminophen (TYLENOL) 325 MG tablet Take 650 mg by mouth every 6 (six) hours as needed for mild pain.  [name] aspirin EC 81 MG tablet Take 81 mg by mouth  daily.  . Biotin 5000 MCG CAPS Take 5,000 mcg by mouth daily.  . fexofenadine (ALLEGRA) 180 MG tablet Take 180 mg by mouth daily.  [name] labetalol (NORMODYNE) 200 MG tablet TAKE 1 TABLET BY MOUTH 3  TIMES DAILY  . Lysine HCl 500 MG CAPS Take 500 mg by mouth daily.   . Magnesium 250 MG TABS Take 1 tablet by mouth daily.   . Olmesartan-amLODIPine-HCTZ 40-10-25 MG TABS TAKE 1 TABLET BY MOUTH EVERY DAY  . pantoprazole (PROTONIX) 20 MG tablet Take 1 tablet (20 mg total) by mouth daily.  . simvastatin (ZOCOR) 20 MG tablet TAKE 1 TABLET BY MOUTH  DAILY (Patient taking differently: Take 10 mg by mouth daily at 6 PM. )  . torsemide (DEMADEX) 5 MG tablet Take 5 mg by mouth daily. As needed with leg swelling  . vitamin B-12 (CYANOCOBALAMIN) 1000 MCG tablet Take 1,000 mcg by mouth daily.  . mirabegron ER (MYRBETRIQ) 50 MG TB24 tablet Take 1 tablet (50 mg total) by mouth daily. (Patient not taking: Reported on 07/07/2020)  . oxybutynin (DITROPAN-XL) 10 MG 24 hr tablet Take 1 tablet (10 mg total) by mouth daily. (Patient not taking: Reported on 03/11/2019)  . sucralfate (CARAFATE) 1 g tablet Take 1 tablet (1 g total) by mouth 4 (four) times daily -  with meals and at bedtime. (Patient not taking: Reported on 01/06/2020)  . tolterodine (DETROL LA) 4 MG 24 hr capsule Take 1 capsule (4 mg total) by mouth daily. (Patient not taking: Reported on 01/06/2020)   No facility-administered encounter medications on file as of 07/13/2020.    Allergies (verified) Quinine, Levofloxacin, Statins,  Celecoxib, and Codeine   History: Past Medical History:  Diagnosis Date  . Acid reflux   . Arthritis   . Back pain   . Gastric polyp 2014  . Hypertension   . Stomach ulcer   . Stroke [facility])    Past Surgical History:  Procedure Laterality Date  . BACK SURGERY  2011   vertebroplasty   . CARDIAC CATHETERIZATION  2007   normal, [employer] [facility] [place]   . CARPAL TUNNEL RELEASE  2007  . cataract surgery   2013  . COLON  SURGERY  2015   lap sigmoid resection  . COLONOSCOPY  2011, 2015   Dr. Nicolasa Ducking, Dr Melody Comas  . LAMINECTOMY  2011   decompression   . UPPER GI ENDOSCOPY  2011  . VASCULAR SURGERY Right 2010   right rental artery    Family History  Problem Relation Age of Onset  . Cancer Father        stomach  . Emphysema Father   . Hypertension Mother   . Atrial fibrillation Mother   . Diabetes Sister   . Hypertension Sister   . Skin cancer Sister   . Hypertension Brother   . Heart disease Brother   . Heart disease Sister   . Colon polyps Sister   . Hypertension Sister   . Thyroid disease Sister   . Irritable bowel syndrome Sister   . Breast cancer Sister   . Cancer Sister        breast  . Heart disease Brother   . Colon polyps Brother   . Cancer Brother        stomach  . Diabetes Brother   . Hypertension Brother   . Neuropathy Brother   . Post-traumatic stress disorder Brother   . Hypertension Brother   . Pneumonia Brother   . Breast cancer Paternal Aunt   . Breast cancer Paternal Grandmother   . Breast cancer Paternal Aunt   . Breast cancer Paternal Aunt   . Breast cancer Paternal Aunt   . Breast cancer Other    Social History   Socioeconomic History  . Marital status: Divorced    Spouse name: none  . Number of children: 1  . Years of education: 79  . Highest education level: Some college, no degree  Occupational History  . Occupation: retired  Tobacco Use  . Smoking status: Never Smoker  . Smokeless tobacco: Never Used  Vaping Use  . Vaping Use: Never used  Substance and Sexual Activity  . Alcohol use: No  . Drug use: No  . Sexual activity: Never  Other Topics Concern  . Not on file  Social History Narrative  . Not on file   Social Determinants of Health   Financial Resource Strain: Low Risk   . Difficulty of Paying Living Expenses: Not hard at all  Food Insecurity: No Food Insecurity  . Worried About Charity fundraiser in the Last Year: Never true  . Ran  Out of Food in the Last Year: Never true  Transportation Needs: No Transportation Needs  . Lack of Transportation (Medical): No  . Lack of Transportation (Non-Medical): No  Physical Activity: Inactive  . Days of Exercise per Week: 0 days  . Minutes of Exercise per Session: 0 min  Stress: No Stress Concern Present  . Feeling of Stress : Not at all  Social Connections: Socially Isolated  . Frequency of Communication with Friends and Family: More than three times a week  .  Frequency of Social Gatherings with Friends and Family: Once a week  . Attends Religious Services: Never  . Active Member of Clubs or Organizations: No  . Attends Archivist Meetings: Never  . Marital Status: Divorced    Tobacco Counseling Counseling given: Not Answered   Clinical Intake:  Pre-visit preparation completed: Yes  Pain : 0-10 Pain Score: 3  Pain Type: Chronic pain Pain Location: Back Pain Orientation: Lower Pain Descriptors / Indicators: Aching Pain Frequency: Intermittent Pain Relieving Factors: Takes Tylenol as needed.  Pain Relieving Factors: Takes Tylenol as needed.  Nutritional Risks: None Diabetes: No  How often do you need to have someone help you when you read instructions, pamphlets, or other written materials from your doctor or pharmacy?: 1 - Never  Diabetic? No  Interpreter Needed?: No  Information entered by :: [facility], LPN   Activities of Daily Living In your present state of health, do you have any difficulty performing the following activities: 07/13/2020 07/07/2020  Hearing? N Y  Vision? N N  Difficulty concentrating or making decisions? N N  Walking or climbing stairs? Y Y  Comment Due to back pain. -  Dressing or bathing? N N  Doing errands, shopping? N N  Preparing Food and eating ? N -  Using the Toilet? N -  In the past six months, have you accidently leaked urine? Y -  Comment Often, wears protection daily. -  Do you have problems with loss of  bowel control? N -  Managing your Medications? N -  Managing your Finances? N -  Housekeeping or managing your Housekeeping? N -  Some recent data might be hidden    Patient Care Team: Jerrol Banana., MD as PCP - General (Unknown Physician Specialty) Leandrew Koyanagi, MD as Referring Physician (Ophthalmology) Murlean Iba, MD (Nephrology) Corey Skains, MD as Consulting Physician (Cardiology)  Indicate any recent Medical Services you may have received from other than [facility] providers in the past year (date may be approximate).     Assessment:   This is a routine wellness examination for Dawn [name].  Hearing/Vision screen No exam data present  Dietary issues and exercise activities discussed: Current Exercise Habits: The patient does not participate in regular exercise at present, Exercise limited by: orthopedic condition(s)  Goals    . Eat more fruits and vegetables     Recommend increasing amount of fruits and vegetables in daily diet to 2 servings a day of each.     . Prevent falls     Recommend to remove any items from the home that may cause slips or trips.    . Reduce caffeine intake     Recommend to decrease the amount of caffeinated  drinks to 2 a day versus 5 a day and increase water intake.       Depression Screen PHQ 2/9 Scores 07/07/2020 07/08/2019 07/02/2018 06/28/2017 06/15/2016 06/16/2015 02/17/2015  PHQ - 2 Score 0 1 1 2  0 0 0  PHQ- 9 Score - - - 4 - - -    Fall Risk Fall Risk  07/13/2020 07/08/2019 07/08/2019 07/02/2018 06/28/2017  Falls in the past year? 1 0 0 No No  Number falls in past yr: 0 0 0 - -  Injury with Fall? 1 0 0 - -  Comment Broken both hands and had a concussion. - - - -  Risk for fall due to : - - - - -  Follow up Falls prevention discussed - - - -  Any stairs in or around the home? Yes  If so, are there any without handrails? No  Home free of loose throw rugs in walkways, pet beds, electrical cords, etc? Yes  Adequate  lighting in your home to reduce risk of falls? Yes   ASSISTIVE DEVICES UTILIZED TO PREVENT FALLS:  Life alert? No  Use of a cane, walker or w/c? Yes  Grab bars in the bathroom? Yes  Shower chair or bench in shower? No  Elevated toilet seat or a handicapped toilet? Yes    Cognitive Function:      6CIT Screen 07/07/2020 06/28/2017  What Year? 0 points 0 points  What month? 0 points 0 points  What time? 0 points 0 points  Count back from 20 0 points 0 points  Months in reverse 0 points 0 points  Repeat phrase 0 points 2 points  Total Score 0 2    Immunizations Immunization History  Administered Date(s) Administered  . Fluad Quad(high Dose 65+) 06/11/2019, 07/07/2020  . Influenza, High Dose Seasonal PF 06/16/2015, 06/29/2016, 06/28/2017, 07/02/2018  . Influenza,inj,quad, With Preservative 09/11/2016  . Moderna SARS-COVID-2 Vaccination 01/17/2020, 02/15/2020  . Pneumococcal Conjugate-13 02/17/2015  . Pneumococcal Polysaccharide-23 08/14/2012  . Pneumococcal-Unspecified 09/11/2016    TDAP status: Up to date Flu Vaccine status: Up to date Pneumococcal vaccine status: Up to date Covid-19 vaccine status: Completed vaccines  Qualifies for Shingles Vaccine? Yes   Zostavax completed No   Shingrix Completed?: No.    Education has been provided regarding the importance of this vaccine. Patient has been advised to call insurance company to determine out of pocket expense if they have not yet received this vaccine. Advised may also receive vaccine at local pharmacy or Health Dept. Verbalized acceptance and understanding.  Screening Tests Health Maintenance  Topic Date Due  . TETANUS/TDAP  01/10/2023  . INFLUENZA VACCINE  Completed  . DEXA SCAN  Completed  . COVID-19 Vaccine  Completed  . PNA vac Low Risk Adult  Completed    Health Maintenance  There are no preventive care reminders to display for this patient.  Colorectal cancer screening: No longer required.  Mammogram  status: No longer required.  Bone Density status: Completed 03/25/04. Results reflect: Previous DEXA scan was normal. No repeat needed unless advised by a physician.  Lung Cancer Screening: (Low Dose CT Chest recommended if Age 19-80 years, 30 pack-year currently smoking OR have quit w/in 15years.) does not qualify.   Additional Screening:  Vision Screening: Recommended annual ophthalmology exams for early detection of glaucoma and other disorders of the eye. Is the patient up to date with their annual eye exam?  Yes  Who is the provider or what is the name of the office in which the patient attends annual eye exams? Dr Wallace Going @ [place] If pt is not established with a provider, would they like to be referred to a provider to establish care? No .   Dental Screening: Recommended annual dental exams for proper oral hygiene  Community Resource Referral / Chronic Care Management: CRR required this visit?  No   CCM required this visit?  No      Plan:     I have personally reviewed and noted the following in the patient's chart:   . Medical and social history . Use of alcohol, tobacco or illicit drugs  . Current medications and supplements . Functional ability and status . Nutritional status . Physical activity . Advanced directives . List of other physicians . Hospitalizations, surgeries,  and ER visits in previous 12 months . Vitals . Screenings to include cognitive, depression, and falls . Referrals and appointments  In addition, I have reviewed and discussed with patient certain preventive protocols, quality metrics, and best practice recommendations. A written personalized care plan for preventive services as well as general preventive health recommendations were provided to patient.     Zeno Hickel [place], [place]   14/09/299   Nurse Notes: None.

## 2020-07-13 ENCOUNTER — Ambulatory Visit (INDEPENDENT_AMBULATORY_CARE_PROVIDER_SITE_OTHER): Payer: Medicare Other

## 2020-07-13 ENCOUNTER — Other Ambulatory Visit: Payer: Self-pay

## 2020-07-13 DIAGNOSIS — Z0000 Encounter for general adult medical examination without abnormal findings: Secondary | ICD-10-CM

## 2020-07-13 DIAGNOSIS — K219 Gastro-esophageal reflux disease without esophagitis: Secondary | ICD-10-CM

## 2020-07-13 MED ORDER — PANTOPRAZOLE SODIUM 20 MG PO TBEC: 20.0000 mg | DELAYED_RELEASE_TABLET | Freq: Every day | ORAL | 1 refills | Status: DC

## 2020-07-13 NOTE — Telephone Encounter (Signed)
Spoke with pt today to complete her telephonic AWV and pt inquired about refilling her Pantoprazole 20 mg prescription. Pt stated that she spoke with the nurse about it at her apt last week but that the pharmacy said they had not received it and it had expired. Please advise, thank you.

## 2020-07-13 NOTE — Patient Instructions (Signed)
Dawn [name] , Thank you for taking time to come for your Medicare Wellness Visit. I appreciate your ongoing commitment to your health goals. Please review the following plan we discussed and let me know if I can assist you in the future.   Screening recommendations/referrals: Colonoscopy: No longer required.  Mammogram: No longer required.  Bone Density: Previous DEXA scan was normal. No repeat needed unless advised by a physician. Recommended yearly ophthalmology/optometry visit for glaucoma screening and checkup Recommended yearly dental visit for hygiene and checkup  Vaccinations: Influenza vaccine: Done 07/07/20 Pneumococcal vaccine: Completed series Tdap vaccine: Up to date, due 01/2023 Shingles vaccine: Shingrix discussed. Please contact your pharmacy for coverage information.     Advanced directives: Advance directive discussed with you today. Even though you declined this today please call our office should you change your mind and we can give you the proper paperwork for you to fill out.  Conditions/risks identified: Fall risk preventatives discussed today. Recommend to decrease the amount of caffeinated drinks to 2 a day versus 3 a day and increase water intake. Recommend increasing amount of fruits and vegetables in daily diet to 2 servings a day of each.   Next appointment: 12/01/20 @ 3:00 PM with Dr Rosanna Randy    Preventive Care 58 Years and Older, Female Preventive care refers to lifestyle choices and visits with your health care provider that can promote health and wellness. What does preventive care include?  A yearly physical exam. This is also called an annual well check.  Dental exams once or twice a year.  Routine eye exams. Ask your health care provider how often you should have your eyes checked.  Personal lifestyle choices, including:  Daily care of your teeth and gums.  Regular physical activity.  Eating a healthy diet.  Avoiding tobacco and drug  use.  Limiting alcohol use.  Practicing safe sex.  Taking low-dose aspirin every day.  Taking vitamin and mineral supplements as recommended by your health care provider. What happens during an annual well check? The services and screenings done by your health care provider during your annual well check will depend on your age, overall health, lifestyle risk factors, and family history of disease. Counseling  Your health care provider may ask you questions about your:  Alcohol use.  Tobacco use.  Drug use.  Emotional well-being.  Home and relationship well-being.  Sexual activity.  Eating habits.  History of falls.  Memory and ability to understand (cognition).  Work and work Statistician.  Reproductive health. Screening  You may have the following tests or measurements:  Height, weight, and BMI.  Blood pressure.  Lipid and cholesterol levels. These may be checked every 5 years, or more frequently if you are over 44 years old.  Skin check.  Lung cancer screening. You may have this screening every year starting at age 26 if you have a 30-pack-year history of smoking and currently smoke or have quit within the past 15 years.  Fecal occult blood test (FOBT) of the stool. You may have this test every year starting at age 71.  Flexible sigmoidoscopy or colonoscopy. You may have a sigmoidoscopy every 5 years or a colonoscopy every 10 years starting at age 74.  Hepatitis C blood test.  Hepatitis B blood test.  Sexually transmitted disease (STD) testing.  Diabetes screening. This is done by checking your blood sugar (glucose) after you have not eaten for a while (fasting). You may have this done every 1-3 years.  Bone density  scan. This is done to screen for osteoporosis. You may have this done starting at age 32.  Mammogram. This may be done every 1-2 years. Talk to your health care provider about how often you should have regular mammograms. Talk with your  health care provider about your test results, treatment options, and if necessary, the need for more tests. Vaccines  Your health care provider may recommend certain vaccines, such as:  Influenza vaccine. This is recommended every year.  Tetanus, diphtheria, and acellular pertussis (Tdap, Td) vaccine. You may need a Td booster every 10 years.  Zoster vaccine. You may need this after age 76.  Pneumococcal 13-valent conjugate (PCV13) vaccine. One dose is recommended after age 20.  Pneumococcal polysaccharide (PPSV23) vaccine. One dose is recommended after age 76. Talk to your health care provider about which screenings and vaccines you need and how often you need them. This information is not intended to replace advice given to you by your health care provider. Make sure you discuss any questions you have with your health care provider. Document Released: 09/24/2015 Document Revised: 05/17/2016 Document Reviewed: 06/29/2015 Elsevier Interactive Patient Education  2017 [place] Prevention in the Home Falls can cause injuries. They can happen to people of all ages. There are many things you can do to make your home safe and to help prevent falls. What can I do on the outside of my home?  Regularly fix the edges of walkways and driveways and fix any cracks.  Remove anything that might make you trip as you walk through a door, such as a raised step or threshold.  Trim any bushes or trees on the path to your home.  Use bright outdoor lighting.  Clear any walking paths of anything that might make someone trip, such as rocks or tools.  Regularly check to see if handrails are loose or broken. Make sure that both sides of any steps have handrails.  Any raised decks and porches should have guardrails on the edges.  Have any leaves, snow, or ice cleared regularly.  Use sand or salt on walking paths during winter.  Clean up any spills in your garage right away. This includes oil  or grease spills. What can I do in the bathroom?  Use night lights.  Install grab bars by the toilet and in the tub and shower. Do not use towel bars as grab bars.  Use non-skid mats or decals in the tub or shower.  If you need to sit down in the shower, use a plastic, non-slip stool.  Keep the floor dry. Clean up any water that spills on the floor as soon as it happens.  Remove soap buildup in the tub or shower regularly.  Attach bath mats securely with double-sided non-slip rug tape.  Do not have throw rugs and other things on the floor that can make you trip. What can I do in the bedroom?  Use night lights.  Make sure that you have a light by your bed that is easy to reach.  Do not use any sheets or blankets that are too big for your bed. They should not hang down onto the floor.  Have a firm chair that has side arms. You can use this for support while you get dressed.  Do not have throw rugs and other things on the floor that can make you trip. What can I do in the kitchen?  Clean up any spills right away.  Avoid walking on wet  floors.  Keep items that you use a lot in easy-to-reach places.  If you need to reach something above you, use a strong step stool that has a grab bar.  Keep electrical cords out of the way.  Do not use floor polish or wax that makes floors slippery. If you must use wax, use non-skid floor wax.  Do not have throw rugs and other things on the floor that can make you trip. What can I do with my stairs?  Do not leave any items on the stairs.  Make sure that there are handrails on both sides of the stairs and use them. Fix handrails that are broken or loose. Make sure that handrails are as long as the stairways.  Check any carpeting to make sure that it is firmly attached to the stairs. Fix any carpet that is loose or worn.  Avoid having throw rugs at the top or bottom of the stairs. If you do have throw rugs, attach them to the floor with  carpet tape.  Make sure that you have a light switch at the top of the stairs and the bottom of the stairs. If you do not have them, ask someone to add them for you. What else can I do to help prevent falls?  Wear shoes that:  Do not have high heels.  Have rubber bottoms.  Are comfortable and fit you well.  Are closed at the toe. Do not wear sandals.  If you use a stepladder:  Make sure that it is fully opened. Do not climb a closed stepladder.  Make sure that both sides of the stepladder are locked into place.  Ask someone to hold it for you, if possible.  Clearly mark and make sure that you can see:  Any grab bars or handrails.  First and last steps.  Where the edge of each step is.  Use tools that help you move around (mobility aids) if they are needed. These include:  Canes.  Walkers.  Scooters.  Crutches.  Turn on the lights when you go into a dark area. Replace any light bulbs as soon as they burn out.  Set up your furniture so you have a clear path. Avoid moving your furniture around.  If any of your floors are uneven, fix them.  If there are any pets around you, be aware of where they are.  Review your medicines with your doctor. Some medicines can make you feel dizzy. This can increase your chance of falling. Ask your doctor what other things that you can do to help prevent falls. This information is not intended to replace advice given to you by your health care provider. Make sure you discuss any questions you have with your health care provider. Document Released: 06/24/2009 Document Revised: 02/03/2016 Document Reviewed: 10/02/2014 Elsevier Interactive Patient Education  2017 [employer].

## 2020-07-28 DIAGNOSIS — N1832 Chronic kidney disease, stage 3b: Secondary | ICD-10-CM | POA: Diagnosis not present

## 2020-08-01 ENCOUNTER — Other Ambulatory Visit: Payer: Self-pay | Admitting: Family Medicine

## 2020-08-03 ENCOUNTER — Encounter: Payer: Self-pay | Admitting: Family Medicine

## 2020-08-04 DIAGNOSIS — N1832 Chronic kidney disease, stage 3b: Secondary | ICD-10-CM | POA: Diagnosis not present

## 2020-08-04 DIAGNOSIS — R600 Localized edema: Secondary | ICD-10-CM | POA: Diagnosis not present

## 2020-08-04 DIAGNOSIS — N1330 Unspecified hydronephrosis: Secondary | ICD-10-CM | POA: Diagnosis not present

## 2020-08-04 DIAGNOSIS — N261 Atrophy of kidney (terminal): Secondary | ICD-10-CM | POA: Diagnosis not present

## 2020-08-04 DIAGNOSIS — I10 Essential (primary) hypertension: Secondary | ICD-10-CM | POA: Diagnosis not present

## 2020-09-10 ENCOUNTER — Other Ambulatory Visit: Payer: Self-pay | Admitting: Family Medicine

## 2020-10-06 DIAGNOSIS — H903 Sensorineural hearing loss, bilateral: Secondary | ICD-10-CM | POA: Diagnosis not present

## 2020-10-06 DIAGNOSIS — H6123 Impacted cerumen, bilateral: Secondary | ICD-10-CM | POA: Diagnosis not present

## 2020-10-16 ENCOUNTER — Other Ambulatory Visit: Payer: Self-pay | Admitting: Family Medicine

## 2020-11-16 DIAGNOSIS — E7800 Pure hypercholesterolemia, unspecified: Secondary | ICD-10-CM | POA: Diagnosis not present

## 2020-11-16 DIAGNOSIS — R0602 Shortness of breath: Secondary | ICD-10-CM | POA: Diagnosis not present

## 2020-11-16 DIAGNOSIS — R0600 Dyspnea, unspecified: Secondary | ICD-10-CM | POA: Diagnosis not present

## 2020-11-16 DIAGNOSIS — I700 Atherosclerosis of aorta: Secondary | ICD-10-CM | POA: Diagnosis not present

## 2020-11-16 DIAGNOSIS — I340 Nonrheumatic mitral (valve) insufficiency: Secondary | ICD-10-CM | POA: Diagnosis not present

## 2020-11-16 DIAGNOSIS — I6523 Occlusion and stenosis of bilateral carotid arteries: Secondary | ICD-10-CM | POA: Diagnosis not present

## 2020-11-16 DIAGNOSIS — I10 Essential (primary) hypertension: Secondary | ICD-10-CM | POA: Diagnosis not present

## 2020-11-16 DIAGNOSIS — I071 Rheumatic tricuspid insufficiency: Secondary | ICD-10-CM | POA: Diagnosis not present

## 2020-11-17 DIAGNOSIS — I340 Nonrheumatic mitral (valve) insufficiency: Secondary | ICD-10-CM | POA: Insufficient documentation

## 2020-11-17 DIAGNOSIS — I071 Rheumatic tricuspid insufficiency: Secondary | ICD-10-CM | POA: Insufficient documentation

## 2020-12-01 ENCOUNTER — Ambulatory Visit (INDEPENDENT_AMBULATORY_CARE_PROVIDER_SITE_OTHER): Payer: Medicare Other | Admitting: Family Medicine

## 2020-12-01 ENCOUNTER — Encounter: Payer: Self-pay | Admitting: Family Medicine

## 2020-12-01 ENCOUNTER — Other Ambulatory Visit: Payer: Self-pay

## 2020-12-01 VITALS — BP 157/78 | HR 58 | Temp 98.2°F | Resp 16 | Ht 61.0 in | Wt 188.0 lb

## 2020-12-01 DIAGNOSIS — E7800 Pure hypercholesterolemia, unspecified: Secondary | ICD-10-CM | POA: Diagnosis not present

## 2020-12-01 DIAGNOSIS — Z6835 Body mass index (BMI) 35.0-35.9, adult: Secondary | ICD-10-CM

## 2020-12-01 DIAGNOSIS — K219 Gastro-esophageal reflux disease without esophagitis: Secondary | ICD-10-CM

## 2020-12-01 DIAGNOSIS — I10 Essential (primary) hypertension: Secondary | ICD-10-CM

## 2020-12-01 DIAGNOSIS — J301 Allergic rhinitis due to pollen: Secondary | ICD-10-CM | POA: Diagnosis not present

## 2020-12-01 MED ORDER — CETIRIZINE HCL 10 MG PO TABS: 10.0000 mg | ORAL_TABLET | Freq: Every day | ORAL | 11 refills | Status: DC

## 2020-12-01 MED ORDER — PANTOPRAZOLE SODIUM 20 MG PO TBEC: 20.0000 mg | DELAYED_RELEASE_TABLET | Freq: Every day | ORAL | 1 refills | Status: DC

## 2020-12-01 NOTE — Progress Notes (Signed)
Established patient visit   Patient: Dawn [name]   DOB: March 22, 1934   87 y.o. Female  MRN: [id number] Visit Date: 12/01/2020  Today's healthcare provider: Wilhemena Durie, MD   Chief Complaint  Patient presents with  . Hypertension  . Hyperlipidemia  . Gastroesophageal Reflux  . Allergic Rhinitis    Subjective    HPI  Patient comes in today for follow-up.  She has been feeling very well recently.  No complaints. She states she will start checking her blood pressure at home. She does have chronic reflux for which she needs pantoprazole daily. She has been having allergies lately with the spring pollen and Delma Freeze is not helping. She has had 2 Covid shots but no booster. Hypertension, follow-up  BP Readings from Last 3 Encounters:  12/01/20 (!) 157/78  07/07/20 129/72  01/06/20 140/64   Wt Readings from Last 3 Encounters:  12/01/20 188 lb (85.3 kg)  07/07/20 187 lb (84.8 kg)  01/06/20 191 lb 12.8 oz (87 kg)     She was last seen for hypertension 4 months ago.  BP at that visit was 129/72. Management since that visit includes continuing medications.  She reports good compliance with treatment. She is not having side effects.  She is following a Regular diet. She is not exercising. She does not smoke.  Use of agents associated with hypertension: none.   Outside blood pressures are checked occasionally. Symptoms: No chest pain No chest pressure  No palpitations No syncope  No dyspnea No orthopnea  No paroxysmal nocturnal dyspnea No lower extremity edema   Pertinent labs: Lab Results  Component Value Date   CHOL 157 01/06/2020   HDL 52 01/06/2020   LDLCALC 80 01/06/2020   TRIG 143 01/06/2020   CHOLHDL 3.0 01/06/2020   Lab Results  Component Value Date   NA 133 (L) 01/06/2020   K 4.2 01/06/2020   CREATININE 1.32 (H) 01/06/2020   GFRNONAA 37 (L) 01/06/2020   GFRAA 43 (L) 01/06/2020   GLUCOSE 86 01/06/2020     The ASCVD Risk score (Goff DC  Jr., et al., 2013) failed to calculate for the following reasons:   The 2013 ASCVD risk score is only valid for ages 61 to 50   --------------------------------------------------------------------------------------------------- Lipid/Cholesterol, Follow-up  Last lipid panel Other pertinent labs  Lab Results  Component Value Date   CHOL 157 01/06/2020   HDL 52 01/06/2020   LDLCALC 80 01/06/2020   TRIG 143 01/06/2020   CHOLHDL 3.0 01/06/2020   Lab Results  Component Value Date   ALT 10 01/06/2020   AST 16 01/06/2020   PLT 266 01/06/2020   TSH 2.000 01/06/2020     She was last seen for this 4 months ago.  Management since that visit includes no changes.  She reports good compliance with treatment. She is not having side effects.   Symptoms: No chest pain No chest pressure/discomfort  No dyspnea No lower extremity edema  No numbness or tingling of extremity No orthopnea  No palpitations No paroxysmal nocturnal dyspnea  No speech difficulty No syncope   Current diet: well balanced Current exercise: no regular exercise  The ASCVD Risk score ([place]., et al., 2013) failed to calculate for the following reasons:   The 2013 ASCVD risk score is only valid for ages 95 to 29       Medications: Outpatient Medications Prior to Visit  Medication Sig  . acetaminophen (TYLENOL) 325 MG tablet Take 650  mg by mouth every 6 (six) hours as needed for mild pain.  [name] aspirin EC 81 MG tablet Take 81 mg by mouth daily.  . Biotin 5000 MCG CAPS Take 5,000 mcg by mouth daily.  . fexofenadine (ALLEGRA) 180 MG tablet Take 180 mg by mouth daily.  [name] labetalol (NORMODYNE) 200 MG tablet TAKE 1 TABLET BY MOUTH 3  TIMES DAILY  . Lysine HCl 500 MG CAPS Take 500 mg by mouth daily.   . Magnesium 250 MG TABS Take 1 tablet by mouth daily.   . Olmesartan-amLODIPine-HCTZ 40-10-25 MG TABS TAKE 1 TABLET BY MOUTH EVERY DAY  . simvastatin (ZOCOR) 20 MG tablet TAKE 1 TABLET BY MOUTH  DAILY (Patient taking  differently: Take 10 mg by mouth daily at 6 PM.)  . torsemide (DEMADEX) 5 MG tablet Take 5 mg by mouth daily. As needed with leg swelling  . vitamin B-12 (CYANOCOBALAMIN) 1000 MCG tablet Take 1,000 mcg by mouth daily.  . mirabegron ER (MYRBETRIQ) 50 MG TB24 tablet Take 1 tablet (50 mg total) by mouth daily. (Patient not taking: No sig reported)  . oxybutynin (DITROPAN-XL) 10 MG 24 hr tablet Take 1 tablet (10 mg total) by mouth daily. (Patient not taking: No sig reported)  . pantoprazole (PROTONIX) 20 MG tablet Take 1 tablet (20 mg total) by mouth daily.  . sucralfate (CARAFATE) 1 g tablet Take 1 tablet (1 g total) by mouth 4 (four) times daily -  with meals and at bedtime. (Patient not taking: No sig reported)  . tolterodine (DETROL LA) 4 MG 24 hr capsule Take 1 capsule (4 mg total) by mouth daily. (Patient not taking: No sig reported)   No facility-administered medications prior to visit.    Review of Systems  Constitutional: Negative for activity change and fatigue.  Respiratory: Negative for cough and shortness of breath.   Cardiovascular: Negative for chest pain, palpitations and leg swelling.  Endocrine: Negative for cold intolerance, heat intolerance, polydipsia, polyphagia and polyuria.  Musculoskeletal: Negative for arthralgias and myalgias.  Neurological: Negative for dizziness, light-headedness and headaches.        Objective    BP (!) 157/78   Pulse (!) 58   Temp 98.2 F (36.8 C)   Resp 16   Ht 5\' 1"  (1.549 m)   Wt 188 lb (85.3 kg)   BMI 35.52 kg/m  BP Readings from Last 3 Encounters:  12/01/20 (!) 157/78  07/07/20 129/72  01/06/20 140/64   Wt Readings from Last 3 Encounters:  12/01/20 188 lb (85.3 kg)  07/07/20 187 lb (84.8 kg)  01/06/20 191 lb 12.8 oz (87 kg)       Physical Exam Vitals reviewed.  Constitutional:      Appearance: She is well-developed.  HENT:     Head: Normocephalic and atraumatic.     Right Ear: External ear normal.     Left Ear:  External ear normal.  Eyes:     General: No scleral icterus.    Conjunctiva/sclera: Conjunctivae normal.  Neck:     Thyroid: No thyromegaly.  Cardiovascular:     Rate and Rhythm: Normal rate and regular rhythm.     Heart sounds: Normal heart sounds.  Pulmonary:     Effort: Pulmonary effort is normal.     Breath sounds: Normal breath sounds.  Abdominal:     Palpations: Abdomen is soft.  Lymphadenopathy:     Cervical: No cervical adenopathy.  Skin:    General: Skin is warm and dry.  Neurological:  Mental Status: She is alert and oriented to person, place, and time.     Comments: She has very minimal left arm weakness since her CVA.  She  claims no residual issues with her left leg  Psychiatric:        Mood and Affect: Mood normal.        Behavior: Behavior normal.        Thought Content: Thought content normal.        Judgment: Judgment normal.       No results found for any visits on 12/01/20.  Assessment & Plan     1. Essential hypertension Check blood pressure at home and bring in readings.  Follow-up 2 to 3 months  2. Hypercholesteremia On simvastatin, consider changing to atorvastatin or rosuvastatin  3. Gastroesophageal reflux disease, unspecified whether esophagitis present Needs Protonix daily - pantoprazole (PROTONIX) 20 MG tablet; Take 1 tablet (20 mg total) by mouth daily.  Dispense: 90 tablet; Refill: 1  4. Seasonal allergic rhinitis due to pollen Stop Allegra and try Zyrtec 10 mg daily - cetirizine (ZYRTEC) 10 MG tablet; Take 1 tablet (10 mg total) by mouth daily.  Dispense: 30 tablet; Refill: 11  5. Class 2 severe obesity due to excess calories with serious comorbidity and body mass index (BMI) of 35.0 to 35.9 in adult [facility]) Diet and exercise has been discussed   No follow-ups on file.      I, Wilhemena Durie, MD, have reviewed all documentation for this visit. The documentation on 12/06/20 for the exam, diagnosis, procedures, and orders are  all accurate and complete.    Joslyne Marshburn Cranford Mon, MD  [facility] [phone] (phone) [phone] (fax)  [place]

## 2020-12-09 ENCOUNTER — Other Ambulatory Visit: Payer: Self-pay | Admitting: Family Medicine

## 2020-12-09 NOTE — Telephone Encounter (Signed)
Requested Prescriptions  Pending Prescriptions Disp Refills  . Olmesartan-amLODIPine-HCTZ 40-10-25 MG TABS [Pharmacy Med Name: OLMESARTAN/AMLOD/HCTZ 40-10-25MG  T] 90 tablet 1    Sig: TAKE 1 TABLET BY MOUTH EVERY DAY     Cardiovascular: CCB + ARB + Diuretic Combos Failed - 12/09/2020  6:21 AM      Failed - K in normal range and within 180 days    Potassium  Date Value Ref Range Status  01/06/2020 4.2 3.5 - 5.2 mmol/L Final  12/24/2013 3.4 (L) 3.5 - 5.1 mmol/L Final         Failed - Na in normal range and within 180 days    Sodium  Date Value Ref Range Status  01/06/2020 133 (L) 134 - 144 mmol/L Final  12/20/2013 134 (L) 136 - 145 mmol/L Final         Failed - Cr in normal range and within 180 days    Creat  Date Value Ref Range Status  07/02/2017 1.38 (H) 0.60 - 0.88 mg/dL Final    Comment:    For patients >71 years of age, the reference limit for Creatinine is approximately 13% higher for people identified as African-American. .    Creatinine, Ser  Date Value Ref Range Status  01/06/2020 1.32 (H) 0.57 - 1.00 mg/dL Final         Failed - Ca in normal range and within 180 days    Calcium  Date Value Ref Range Status  01/06/2020 9.8 8.7 - 10.3 mg/dL Final   Calcium, Total  Date Value Ref Range Status  12/20/2013 7.9 (L) 8.5 - 10.1 mg/dL Final         Failed - Last BP in normal range    BP Readings from Last 1 Encounters:  12/01/20 (!) 157/78         Passed - Patient is not pregnant      Passed - Last Heart Rate in normal range    Pulse Readings from Last 1 Encounters:  12/01/20 (!) 28         Passed - Valid encounter within last 6 months    Recent Outpatient Visits          1 week ago Essential hypertension   [facility] Jerrol Banana., MD   5 months ago Need for immunization against influenza   [facility] Jerrol Banana., MD   11 months ago Vitamin B 12 deficiency   [facility] Jerrol Banana., MD   1 year ago Essential hypertension   [facility] Jerrol Banana., MD   1 year ago Need for immunization against influenza   [facility] Jerrol Banana., MD      Future Appointments            In 3 months Jerrol Banana., MD [facility], [place]

## 2021-01-01 ENCOUNTER — Other Ambulatory Visit: Payer: Self-pay | Admitting: Family Medicine

## 2021-01-02 NOTE — Telephone Encounter (Signed)
Requested Prescriptions  Pending Prescriptions Disp Refills  . labetalol (NORMODYNE) 200 MG tablet [Pharmacy Med Name: LABETALOL  200MG   TAB] 270 tablet 0    Sig: TAKE 1 TABLET BY MOUTH 3  TIMES DAILY     Cardiovascular:  Beta Blockers Failed - 01/01/2021  9:38 PM      Failed - Last BP in normal range    BP Readings from Last 1 Encounters:  12/01/20 (!) 157/78         Passed - Last Heart Rate in normal range    Pulse Readings from Last 1 Encounters:  12/01/20 (!) 58         Passed - Valid encounter within last 6 months    Recent Outpatient Visits          1 month ago Essential hypertension   [place] Jerrol Banana., MD   5 months ago Need for immunization against influenza   [facility] Jerrol Banana., MD   12 months ago Vitamin B 12 deficiency   [facility] Jerrol Banana., MD   1 year ago Essential hypertension   [facility] Jerrol Banana., MD   1 year ago Need for immunization against influenza   [facility] Jerrol Banana., MD      Future Appointments            In 2 months Jerrol Banana., MD [facility], [place]

## 2021-01-05 ENCOUNTER — Other Ambulatory Visit: Payer: Self-pay | Admitting: Family Medicine

## 2021-01-05 NOTE — Telephone Encounter (Signed)
Requested Prescriptions  Pending Prescriptions Disp Refills  . simvastatin (ZOCOR) 20 MG tablet [Pharmacy Med Name: Simvastatin 20 MG Oral Tablet] 90 tablet 3    Sig: TAKE 1 TABLET BY MOUTH  DAILY     Cardiovascular:  Antilipid - Statins Failed - 01/05/2021 10:04 PM      Failed - Total Cholesterol in normal range and within 360 days    Cholesterol, Total  Date Value Ref Range Status  01/06/2020 157 100 - 199 mg/dL Final   Cholesterol  Date Value Ref Range Status  06/08/2013 167 0 - 200 mg/dL Final         Failed - LDL in normal range and within 360 days    Ldl Cholesterol, Calc  Date Value Ref Range Status  06/08/2013 90 0 - 100 mg/dL Final   LDL Chol Calc (NIH)  Date Value Ref Range Status  01/06/2020 80 0 - 99 mg/dL Final         Failed - HDL in normal range and within 360 days    HDL Cholesterol  Date Value Ref Range Status  06/08/2013 41 40 - 60 mg/dL Final   HDL  Date Value Ref Range Status  01/06/2020 52 >39 mg/dL Final         Failed - Triglycerides in normal range and within 360 days    Triglycerides  Date Value Ref Range Status  01/06/2020 143 0 - 149 mg/dL Final  06/08/2013 178 0 - 200 mg/dL Final         Passed - Patient is not pregnant      Passed - Valid encounter within last 12 months    Recent Outpatient Visits          1 month ago Essential hypertension   [place] Jerrol Banana., MD   6 months ago Need for immunization against influenza   [facility] Jerrol Banana., MD   1 year ago Vitamin B 12 deficiency   [facility] Jerrol Banana., MD   1 year ago Essential hypertension   [facility] Jerrol Banana., MD   1 year ago Need for immunization against influenza   [facility] Jerrol Banana., MD      Future Appointments            In 2 months Jerrol Banana., MD [facility], [place]

## 2021-01-11 DIAGNOSIS — R0600 Dyspnea, unspecified: Secondary | ICD-10-CM | POA: Diagnosis not present

## 2021-01-15 ENCOUNTER — Other Ambulatory Visit: Payer: Self-pay

## 2021-01-15 ENCOUNTER — Emergency Department
Admission: EM | Admit: 2021-01-15 | Discharge: 2021-01-15 | Disposition: A | Discharge status: Home / Self Care | Payer: Medicare Other | Attending: Emergency Medicine | Admitting: Emergency Medicine

## 2021-01-15 ENCOUNTER — Emergency Department: Payer: Medicare Other

## 2021-01-15 DIAGNOSIS — R42 Dizziness and giddiness: Secondary | ICD-10-CM | POA: Diagnosis not present

## 2021-01-15 DIAGNOSIS — Z7982 Long term (current) use of aspirin: Secondary | ICD-10-CM | POA: Insufficient documentation

## 2021-01-15 DIAGNOSIS — R0789 Other chest pain: Secondary | ICD-10-CM | POA: Diagnosis not present

## 2021-01-15 DIAGNOSIS — Z79899 Other long term (current) drug therapy: Secondary | ICD-10-CM | POA: Diagnosis not present

## 2021-01-15 DIAGNOSIS — R200 Anesthesia of skin: Secondary | ICD-10-CM | POA: Diagnosis not present

## 2021-01-15 DIAGNOSIS — R202 Paresthesia of skin: Secondary | ICD-10-CM | POA: Diagnosis not present

## 2021-01-15 DIAGNOSIS — I10 Essential (primary) hypertension: Secondary | ICD-10-CM | POA: Insufficient documentation

## 2021-01-15 DIAGNOSIS — R079 Chest pain, unspecified: Secondary | ICD-10-CM

## 2021-01-15 LAB — TROPONIN I (HIGH SENSITIVITY)
Troponin I (High Sensitivity): 5 ng/L (ref <18)
Troponin I (High Sensitivity): 5 ng/L (ref <18)

## 2021-01-15 LAB — CBC
HCT: 38.1 % (ref 36.0–46.0)
Hemoglobin: 13 g/dL (ref 12.0–15.0)
MCH: 30.5 pg (ref 26.0–34.0)
MCHC: 34.1 g/dL (ref 30.0–36.0)
MCV: 89.4 fL (ref 80.0–100.0)
Platelets: 241 10*3/uL (ref 150–400)
RBC: 4.26 MIL/uL (ref 3.87–5.11)
RDW: 12.6 % (ref 11.5–15.5)
WBC: 6.5 10*3/uL (ref 4.0–10.5)
nRBC: 0 % (ref 0.0–0.2)

## 2021-01-15 LAB — BASIC METABOLIC PANEL
Anion gap: 11 (ref 5–15)
BUN: 25 mg/dL — ABNORMAL HIGH (ref 8–23)
CO2: 23 mmol/L (ref 22–32)
Calcium: 9.5 mg/dL (ref 8.9–10.3)
Chloride: 95 mmol/L — ABNORMAL LOW (ref 98–111)
Creatinine, Ser: 1.14 mg/dL — ABNORMAL HIGH (ref 0.44–1.00)
GFR, Estimated: 47 mL/min — ABNORMAL LOW (ref >60)
Glucose, Bld: 102 mg/dL — ABNORMAL HIGH (ref 70–99)
Potassium: 3.9 mmol/L (ref 3.5–5.1)
Sodium: 129 mmol/L — ABNORMAL LOW (ref 135–145)

## 2021-01-15 NOTE — ED Provider Notes (Signed)
[facility] Emergency Department Provider Note   ____________________________________________   Event Date/Time   First MD Initiated Contact with Patient 01/15/21 1801     (approximate)  I have reviewed the triage vital signs and the nursing notes.   HISTORY  Chief Complaint Chest Pain    HPI Dawn [name] is a 87 y.o. female with past medical history of hypertension, hyperlipidemia, stroke, and GERD who presents to the ED for lightheadedness and chest pain.  Patient reports that she had just eaten lunch earlier this afternoon when she suddenly started feeling dizzy and lightheaded, like she was going to pass out.  This was associated with a feeling of tightness in her chest and numbness and tingling moving into her left arm.  She denies any associated shortness of breath, has not had any recent fevers or cough.  Patient now states that she is feeling better, symptoms resolved after about 20 minutes.  She denies any associated vision changes, speech changes, numbness, or weakness.        Past Medical History:  Diagnosis Date  . Acid reflux   . Arthritis   . Back pain   . Gastric polyp 2014  . Hypertension   . Stomach ulcer   . Stroke [facility])     Patient Active Problem List   Diagnosis Date Noted  . Hypertension 07/02/2017  . Carotid artery narrowing 08/09/2015  . Bilateral carotid artery stenosis 08/09/2015  . Syncope and collapse 07/30/2015  . Allergic rhinitis 01/05/2015  . Cerebral infarct ([place]) 01/05/2015  . Accelerated essential hypertension 01/05/2015  . Benign gastric polyp 01/05/2015  . Hypercholesteremia 01/05/2015  . Neuropathy 01/05/2015  . Obesity 01/05/2015  . Arthritis of knee, degenerative 01/05/2015  . Borderline diabetes 01/05/2015  . Temporary cerebral vascular dysfunction 01/05/2015  . GERD (gastroesophageal reflux disease) 03/31/2013  . Gastric polyp     Past Surgical History:  Procedure Laterality Date  . BACK  SURGERY  2011   vertebroplasty   . CARDIAC CATHETERIZATION  2007   normal, [employer] [facility] [place]   . CARPAL TUNNEL RELEASE  2007  . cataract surgery   2013  . COLON SURGERY  2015   lap sigmoid resection  . COLONOSCOPY  2011, 2015   Dr. Nicolasa Ducking, Dr Melody Comas  . LAMINECTOMY  2011   decompression   . UPPER GI ENDOSCOPY  2011  . VASCULAR SURGERY Right 2010   right rental artery     Prior to Admission medications   Medication Sig Start Date End Date Taking? Authorizing Provider  acetaminophen (TYLENOL) 325 MG tablet Take 650 mg by mouth every 6 (six) hours as needed for mild pain.    [provider]  aspirin EC 81 MG tablet Take 81 mg by mouth daily.    [provider]  Biotin 5000 MCG CAPS Take 5,000 mcg by mouth daily.    [provider]  cetirizine (ZYRTEC) 10 MG tablet Take 1 tablet (10 mg total) by mouth daily. 12/01/20   Jerrol Banana., MD  labetalol (NORMODYNE) 200 MG tablet TAKE 1 TABLET BY MOUTH 3  TIMES DAILY 01/02/21   Jerrol Banana., MD  Lysine HCl 500 MG CAPS Take 500 mg by mouth daily.     [provider]  Magnesium 250 MG TABS Take 1 tablet by mouth daily.     [provider]  mirabegron ER (MYRBETRIQ) 50 MG TB24 tablet Take 1 tablet (50 mg total) by mouth daily.  Patient not taking: No sig reported 03/25/18   Bjorn Loser, MD  Olmesartan-amLODIPine-HCTZ 40-10-25 MG TABS TAKE 1 TABLET BY MOUTH EVERY DAY 12/09/20   Jerrol Banana., MD  oxybutynin (DITROPAN-XL) 10 MG 24 hr tablet Take 1 tablet (10 mg total) by mouth daily. Patient not taking: No sig reported 06/03/18   Bjorn Loser, MD  pantoprazole (PROTONIX) 20 MG tablet Take 1 tablet (20 mg total) by mouth daily. 12/01/20 03/02/21  Jerrol Banana., MD  simvastatin (ZOCOR) 20 MG tablet TAKE 1 TABLET BY MOUTH  DAILY 01/05/21   Jerrol Banana., MD  sucralfate (CARAFATE) 1 g tablet Take 1 tablet (1 g total) by mouth 4 (four) times daily -   with meals and at bedtime. Patient not taking: No sig reported 11/01/17   Jerrol Banana., MD  tolterodine (DETROL LA) 4 MG 24 hr capsule Take 1 capsule (4 mg total) by mouth daily. Patient not taking: No sig reported 06/03/18   Bjorn Loser, MD  torsemide (DEMADEX) 5 MG tablet Take 5 mg by mouth daily. As needed with leg swelling    [provider]  vitamin B-12 (CYANOCOBALAMIN) 1000 MCG tablet Take 1,000 mcg by mouth daily.    [provider]    Allergies Quinine, Levofloxacin, Statins, Celecoxib, and Codeine  Family History  Problem Relation Age of Onset  . Cancer Father        stomach  . Emphysema Father   . Hypertension Mother   . Atrial fibrillation Mother   . Diabetes Sister   . Hypertension Sister   . Skin cancer Sister   . Hypertension Brother   . Heart disease Brother   . Heart disease Sister   . Colon polyps Sister   . Hypertension Sister   . Thyroid disease Sister   . Irritable bowel syndrome Sister   . Breast cancer Sister   . Cancer Sister        breast  . Heart disease Brother   . Colon polyps Brother   . Cancer Brother        stomach  . Diabetes Brother   . Hypertension Brother   . Neuropathy Brother   . Post-traumatic stress disorder Brother   . Hypertension Brother   . Pneumonia Brother   . Breast cancer Paternal Aunt   . Breast cancer Paternal Grandmother   . Breast cancer Paternal Aunt   . Breast cancer Paternal Aunt   . Breast cancer Paternal Aunt   . Breast cancer Other     Social History Social History   Tobacco Use  . Smoking status: Never Smoker  . Smokeless tobacco: Never Used  Vaping Use  . Vaping Use: Never used  Substance Use Topics  . Alcohol use: No  . Drug use: No    Review of Systems  Constitutional: No fever/chills Eyes: No visual changes. ENT: No sore throat. Cardiovascular: Positive for chest pain.  Positive for lightheadedness and dizziness. Respiratory: Denies shortness of  breath. Gastrointestinal: No abdominal pain.  No nausea, no vomiting.  No diarrhea.  No constipation. Genitourinary: Negative for dysuria. Musculoskeletal: Negative for back pain. Skin: Negative for rash. Neurological: Negative for headaches, focal weakness or numbness.  ____________________________________________   PHYSICAL EXAM:  VITAL SIGNS: ED Triage Vitals  Enc Vitals Group     BP 01/15/21 1421 (!) 159/47     Pulse Rate 01/15/21 1421 (!) 58     Resp 01/15/21 1421 18  Temp 01/15/21 1421 97.6 F (36.4 C)     Temp Source 01/15/21 1421 Oral     SpO2 01/15/21 1421 98 %     Weight 01/15/21 1422 182 lb (82.6 kg)     Height 01/15/21 1422 5\' 1"  (1.549 m)     Head Circumference --      Peak Flow --      Pain Score 01/15/21 1422 5     Pain Loc --      Pain Edu? --      Excl. in [place]? --     Constitutional: Alert and oriented. Eyes: Conjunctivae are normal. Head: Atraumatic. Nose: No congestion/rhinnorhea. Mouth/Throat: Mucous membranes are moist. Neck: Normal ROM Cardiovascular: Normal rate, regular rhythm. Grossly normal heart sounds.  2+ radial pulses bilaterally. Respiratory: Normal respiratory effort.  No retractions. Lungs CTAB.  No chest wall tenderness to palpation. Gastrointestinal: Soft and nontender. No distention. Genitourinary: deferred Musculoskeletal: No lower extremity tenderness nor edema. Neurologic:  Normal speech and language. No gross focal neurologic deficits are appreciated. Skin:  Skin is warm, dry and intact. No rash noted. Psychiatric: Mood and affect are normal. Speech and behavior are normal.  ____________________________________________   LABS (all labs ordered are listed, but only abnormal results are displayed)  Labs Reviewed  BASIC METABOLIC PANEL - Abnormal; Notable for the following components:      Result Value   Sodium 129 (*)    Chloride 95 (*)    Glucose, Bld 102 (*)    BUN 25 (*)    Creatinine, Ser 1.14 (*)    GFR,  Estimated 47 (*)    All other components within normal limits  CBC  TROPONIN I (HIGH SENSITIVITY)  TROPONIN I (HIGH SENSITIVITY)   ____________________________________________  EKG  ED ECG REPORT I, Blake Divine, the attending physician, personally viewed and interpreted this ECG.   Date: 01/15/2021  EKG Time: 14:18  Rate: 56  Rhythm: sinus bradycardia  Axis: Normal  Intervals:none  ST&T Change: None   PROCEDURES  Procedure(s) performed (including Critical Care):  Procedures   ____________________________________________   INITIAL IMPRESSION / ASSESSMENT AND PLAN / ED COURSE       87 year old female with past medical history of hypertension, stroke, GERD, and hyperlipidemia who presents to the ED with episode of lightheadedness, chest tightness, numbness, and tingling extending down her left arm earlier today.  She states that she all symptoms have resolved and she is feeling back to normal.  Symptoms sound atypical for ACS, EKG shows no evidence of arrhythmia or ischemia and 2 sets troponin are negative.  Patient has no focal neurologic deficits on exam and symptoms do not sound consistent with stroke or TIA.  She does have mild hyponatremia on labs, which does appear to be chronic for her.  She has follow-up scheduled with nephrology and cardiology for next week.  She is appropriate for further management as an outpatient, was counseled to return to the ED for new worsening symptoms.  Patient and daughter agree with plan.      ____________________________________________   FINAL CLINICAL IMPRESSION(S) / ED DIAGNOSES  Final diagnoses:  Nonspecific chest pain  Lightheadedness     ED Discharge Orders    None       Note:  This document was prepared using Dragon voice recognition software and may include unintentional dictation errors.   Blake Divine, MD 01/15/21 1840

## 2021-01-15 NOTE — ED Triage Notes (Signed)
Pt comes pov with chest pain and tightness with numbness going down right arm since abuot 1230 today.

## 2021-01-15 NOTE — ED Notes (Signed)
Pt verbalized understanding of d/c instructions at this time. Pt given opportunity to ask questions as needed. Follow up care discussed at this time.   Pt assisted to ED lobby via wheelchair at this time, NAD noted, RR even and unlabored

## 2021-01-17 DIAGNOSIS — I700 Atherosclerosis of aorta: Secondary | ICD-10-CM | POA: Diagnosis not present

## 2021-01-17 DIAGNOSIS — I6523 Occlusion and stenosis of bilateral carotid arteries: Secondary | ICD-10-CM | POA: Diagnosis not present

## 2021-01-17 DIAGNOSIS — E7800 Pure hypercholesterolemia, unspecified: Secondary | ICD-10-CM | POA: Diagnosis not present

## 2021-01-17 DIAGNOSIS — I071 Rheumatic tricuspid insufficiency: Secondary | ICD-10-CM | POA: Diagnosis not present

## 2021-01-17 DIAGNOSIS — I10 Essential (primary) hypertension: Secondary | ICD-10-CM | POA: Diagnosis not present

## 2021-01-25 DIAGNOSIS — N1832 Chronic kidney disease, stage 3b: Secondary | ICD-10-CM | POA: Diagnosis not present

## 2021-01-31 DIAGNOSIS — N1832 Chronic kidney disease, stage 3b: Secondary | ICD-10-CM | POA: Diagnosis not present

## 2021-01-31 DIAGNOSIS — R600 Localized edema: Secondary | ICD-10-CM | POA: Diagnosis not present

## 2021-01-31 DIAGNOSIS — N261 Atrophy of kidney (terminal): Secondary | ICD-10-CM | POA: Diagnosis not present

## 2021-01-31 DIAGNOSIS — N1330 Unspecified hydronephrosis: Secondary | ICD-10-CM | POA: Diagnosis not present

## 2021-01-31 DIAGNOSIS — I10 Essential (primary) hypertension: Secondary | ICD-10-CM | POA: Diagnosis not present

## 2021-03-03 DIAGNOSIS — N1832 Chronic kidney disease, stage 3b: Secondary | ICD-10-CM | POA: Diagnosis not present

## 2021-03-09 ENCOUNTER — Ambulatory Visit: Payer: Self-pay | Admitting: Family Medicine

## 2021-03-19 ENCOUNTER — Other Ambulatory Visit: Payer: Self-pay | Admitting: Family Medicine

## 2021-03-20 NOTE — Telephone Encounter (Signed)
Requested Prescriptions  Pending Prescriptions Disp Refills  . labetalol (NORMODYNE) 200 MG tablet [Pharmacy Med Name: LABETALOL  200MG   TAB] 270 tablet 0    Sig: TAKE 1 TABLET BY MOUTH 3  TIMES DAILY     Cardiovascular:  Beta Blockers Failed - 03/19/2021 10:04 PM      Failed - Last BP in normal range    BP Readings from Last 1 Encounters:  01/15/21 (!) 159/47         Passed - Last Heart Rate in normal range    Pulse Readings from Last 1 Encounters:  01/15/21 (!) 58         Passed - Valid encounter within last 6 months    Recent Outpatient Visits          3 months ago Essential hypertension   [facility] Jerrol Banana., MD   8 months ago Need for immunization against influenza   [facility] Jerrol Banana., MD   1 year ago Vitamin B 12 deficiency   [facility] Jerrol Banana., MD   1 year ago Essential hypertension   [facility] Jerrol Banana., MD   1 year ago Need for immunization against influenza   [facility] Jerrol Banana., MD      Future Appointments            In 1 week Jerrol Banana., MD [facility] [place]

## 2021-03-30 NOTE — Progress Notes (Signed)
Established patient visit   Patient: Dawn [name]   DOB: 10-Apr-1934   87 y.o. Female  MRN: [id number] Visit Date: 03/31/2021  Today's healthcare provider: Wilhemena Durie, MD   Chief Complaint  Patient presents with   Hypertension   Hyperlipidemia   Subjective    HPI  Hypertension, follow-up  BP Readings from Last 3 Encounters:  03/31/21 (!) 148/52  01/15/21 (!) 159/47  12/01/20 (!) 157/78   Wt Readings from Last 3 Encounters:  03/31/21 187 lb (84.8 kg)  01/15/21 182 lb (82.6 kg)  12/01/20 188 lb (85.3 kg)     She was last seen for hypertension 4 months ago.  BP at that visit was 157/78. Management since that visit includes; Check blood pressure at home and bring in readings.  Follow-up 2 to 3 months She reports excellent compliance with treatment. She is not having side effects.  She is not exercising. She is adherent to low salt diet.   Outside blood pressures are occasionally 140-150's/60-80's.  She does not smoke.  Use of agents associated with hypertension: none.   --------------------------------------------------------------------------------------------------- Lipid/Cholesterol, follow-up  Last Lipid Panel: Lab Results  Component Value Date   CHOL 157 01/06/2020   LDLCALC 80 01/06/2020   HDL 52 01/06/2020   TRIG 143 01/06/2020    She was last seen for this 01/06/2020.  Management since that visit includes, On simvastatin, consider changing to atorvastatin or rosuvastatin.  She reports excellent compliance with treatment. She is not having side effects.   She is following a Low Sodium diet. Current exercise: none  Last metabolic panel Lab Results  Component Value Date   GLUCOSE 102 (H) 01/15/2021   NA 129 (L) 01/15/2021   K 3.9 01/15/2021   BUN 25 (H) 01/15/2021   CREATININE 1.14 (H) 01/15/2021   GFRNONAA 47 (L) 01/15/2021   GFRAA 43 (L) 01/06/2020   CALCIUM 9.5 01/15/2021   AST 16 01/06/2020   ALT 10 01/06/2020   The  ASCVD Risk score (Goff DC Jr., et al., 2013) failed to calculate for the following reasons:   The 2013 ASCVD risk score is only valid for ages 60 to 61  ---------------------------------------------------------------------------------------------------      Medications: Outpatient Medications Prior to Visit  Medication Sig   acetaminophen (TYLENOL) 325 MG tablet Take 650 mg by mouth every 6 (six) hours as needed for mild pain.   aspirin EC 81 MG tablet Take 81 mg by mouth daily.   Biotin 5000 MCG CAPS Take 5,000 mcg by mouth daily.   cetirizine (ZYRTEC) 10 MG tablet Take 1 tablet (10 mg total) by mouth daily.   labetalol (NORMODYNE) 200 MG tablet TAKE 1 TABLET BY MOUTH 3  TIMES DAILY   Lysine HCl 500 MG CAPS Take 500 mg by mouth daily.    Magnesium 250 MG TABS Take 1 tablet by mouth daily.    Olmesartan-amLODIPine-HCTZ 40-10-25 MG TABS TAKE 1 TABLET BY MOUTH EVERY DAY   simvastatin (ZOCOR) 20 MG tablet TAKE 1 TABLET BY MOUTH  DAILY   torsemide (DEMADEX) 5 MG tablet Take 5 mg by mouth daily. As needed with leg swelling   vitamin B-12 (CYANOCOBALAMIN) 1000 MCG tablet Take 1,000 mcg by mouth daily.   mirabegron ER (MYRBETRIQ) 50 MG TB24 tablet Take 1 tablet (50 mg total) by mouth daily.   oxybutynin (DITROPAN-XL) 10 MG 24 hr tablet Take 1 tablet (10 mg total) by mouth daily.   pantoprazole (PROTONIX) 20 MG tablet Take 1  tablet (20 mg total) by mouth daily.   sucralfate (CARAFATE) 1 g tablet Take 1 tablet (1 g total) by mouth 4 (four) times daily -  with meals and at bedtime.   tolterodine (DETROL LA) 4 MG 24 hr capsule Take 1 capsule (4 mg total) by mouth daily.   No facility-administered medications prior to visit.    Review of Systems  Constitutional: Negative.  Negative for appetite change, chills, fatigue and fever.  Respiratory: Negative.  Negative for chest tightness and shortness of breath.   Cardiovascular: Negative.  Negative for chest pain and palpitations.   Gastrointestinal: Negative.  Negative for abdominal pain, nausea and vomiting.  Musculoskeletal:  Positive for back pain (Chronic issue).  Neurological:  Negative for dizziness, syncope, speech difficulty, weakness, light-headedness, numbness and headaches.      Objective    BP (!) 148/52 (BP Location: Right Arm, Patient Position: Sitting, Cuff Size: Large)   Pulse 63   Temp 98.3 F (36.8 C) (Oral)   Wt 187 lb (84.8 kg)   SpO2 99%   BMI 35.33 kg/m     Physical Exam Vitals reviewed.  Constitutional:      Appearance: She is well-developed.  HENT:     Head: Normocephalic and atraumatic.     Right Ear: External ear normal.     Left Ear: External ear normal.  Eyes:     General: No scleral icterus.    Conjunctiva/sclera: Conjunctivae normal.  Neck:     Thyroid: No thyromegaly.  Cardiovascular:     Rate and Rhythm: Normal rate and regular rhythm.     Heart sounds: Normal heart sounds.  Pulmonary:     Effort: Pulmonary effort is normal.     Breath sounds: Normal breath sounds.  Abdominal:     Palpations: Abdomen is soft.  Lymphadenopathy:     Cervical: No cervical adenopathy.  Skin:    General: Skin is warm and dry.     Comments: AK's on forearm with 1 area that is dime size it looks like ringworm  Neurological:     General: No focal deficit present.     Mental Status: She is alert and oriented to person, place, and time.  Psychiatric:        Mood and Affect: Mood normal.        Behavior: Behavior normal.        Thought Content: Thought content normal.        Judgment: Judgment normal.      No results found for any visits on 03/31/21.  Assessment & Plan     1. Primary hypertension Good control on labetalol olmesartan amlodipine HCT - CBC - Lipid panel - TSH - Comprehensive metabolic panel  2. Hypercholesteremia On simvastatin 20 - CBC - Lipid panel - TSH - Comprehensive metabolic panel  3. Ringworm  - clotrimazole-betamethasone (LOTRISONE) cream;  Apply 1 application topically daily.  Dispense: 15 g; Refill: 0  4. Stage 3b chronic kidney disease ([place])   5. Actinic keratoses  - Ambulatory referral to Dermatology   No follow-ups on file.      I, Wilhemena Durie, MD, have reviewed all documentation for this visit. The documentation on 04/04/21 for the exam, diagnosis, procedures, and orders are all accurate and complete.    Quatavious Rossa Cranford Mon, MD  [facility] [phone] (phone) [phone] (fax)  [place]

## 2021-03-31 ENCOUNTER — Other Ambulatory Visit: Payer: Self-pay

## 2021-03-31 ENCOUNTER — Ambulatory Visit (INDEPENDENT_AMBULATORY_CARE_PROVIDER_SITE_OTHER): Payer: Medicare Other | Admitting: Family Medicine

## 2021-03-31 ENCOUNTER — Encounter: Payer: Self-pay | Admitting: Family Medicine

## 2021-03-31 VITALS — BP 148/52 | HR 63 | Temp 98.3°F | Wt 187.0 lb

## 2021-03-31 DIAGNOSIS — L570 Actinic keratosis: Secondary | ICD-10-CM

## 2021-03-31 DIAGNOSIS — E7800 Pure hypercholesterolemia, unspecified: Secondary | ICD-10-CM

## 2021-03-31 DIAGNOSIS — I10 Essential (primary) hypertension: Secondary | ICD-10-CM | POA: Diagnosis not present

## 2021-03-31 DIAGNOSIS — B359 Dermatophytosis, unspecified: Secondary | ICD-10-CM

## 2021-03-31 DIAGNOSIS — N1832 Chronic kidney disease, stage 3b: Secondary | ICD-10-CM

## 2021-03-31 MED ORDER — CLOTRIMAZOLE-BETAMETHASONE 1-0.05 % EX CREA: 1.0000 "application " | TOPICAL_CREAM | Freq: Every day | CUTANEOUS | 0 refills | Status: DC

## 2021-03-31 NOTE — Patient Instructions (Signed)
Recommend Covid Booster

## 2021-04-01 LAB — LIPID PANEL
Chol/HDL Ratio: 2.8 ratio (ref 0.0–4.4)
Cholesterol, Total: 158 mg/dL (ref 100–199)
HDL: 57 mg/dL (ref >39)
LDL Chol Calc (NIH): 77 mg/dL (ref 0–99)
Triglycerides: 141 mg/dL (ref 0–149)
VLDL Cholesterol Cal: 24 mg/dL (ref 5–40)

## 2021-04-01 LAB — COMPREHENSIVE METABOLIC PANEL
ALT: 12 IU/L (ref 0–32)
AST: 12 IU/L (ref 0–40)
Albumin/Globulin Ratio: 2 (ref 1.2–2.2)
Albumin: 4.6 g/dL (ref 3.6–4.6)
Alkaline Phosphatase: 85 IU/L (ref 44–121)
BUN/Creatinine Ratio: 15 (ref 12–28)
BUN: 23 mg/dL (ref 8–27)
Bilirubin Total: 0.4 mg/dL (ref 0.0–1.2)
CO2: 21 mmol/L (ref 20–29)
Calcium: 10 mg/dL (ref 8.7–10.3)
Chloride: 94 mmol/L — ABNORMAL LOW (ref 96–106)
Creatinine, Ser: 1.5 mg/dL — ABNORMAL HIGH (ref 0.57–1.00)
Globulin, Total: 2.3 g/dL (ref 1.5–4.5)
Glucose: 97 mg/dL (ref 65–99)
Potassium: 4.2 mmol/L (ref 3.5–5.2)
Sodium: 132 mmol/L — ABNORMAL LOW (ref 134–144)
Total Protein: 6.9 g/dL (ref 6.0–8.5)
eGFR: 34 mL/min/{1.73_m2} — ABNORMAL LOW (ref >59)

## 2021-04-01 LAB — CBC
Hematocrit: 37.7 % (ref 34.0–46.6)
Hemoglobin: 12.8 g/dL (ref 11.1–15.9)
MCH: 30.6 pg (ref 26.6–33.0)
MCHC: 34 g/dL (ref 31.5–35.7)
MCV: 90 fL (ref 79–97)
Platelets: 280 10*3/uL (ref 150–450)
RBC: 4.18 x10E6/uL (ref 3.77–5.28)
RDW: 12.7 % (ref 11.7–15.4)
WBC: 7.4 10*3/uL (ref 3.4–10.8)

## 2021-04-01 LAB — TSH: TSH: 1.96 u[IU]/mL (ref 0.450–4.500)

## 2021-06-06 DIAGNOSIS — N1330 Unspecified hydronephrosis: Secondary | ICD-10-CM | POA: Diagnosis not present

## 2021-06-06 DIAGNOSIS — N1832 Chronic kidney disease, stage 3b: Secondary | ICD-10-CM | POA: Diagnosis not present

## 2021-06-06 DIAGNOSIS — N261 Atrophy of kidney (terminal): Secondary | ICD-10-CM | POA: Diagnosis not present

## 2021-06-06 DIAGNOSIS — R600 Localized edema: Secondary | ICD-10-CM | POA: Diagnosis not present

## 2021-06-06 DIAGNOSIS — I10 Essential (primary) hypertension: Secondary | ICD-10-CM | POA: Diagnosis not present

## 2021-06-07 ENCOUNTER — Other Ambulatory Visit: Payer: Self-pay | Admitting: Family Medicine

## 2021-06-08 ENCOUNTER — Other Ambulatory Visit: Payer: Self-pay | Admitting: Family Medicine

## 2021-06-17 DIAGNOSIS — Z961 Presence of intraocular lens: Secondary | ICD-10-CM | POA: Diagnosis not present

## 2021-08-15 ENCOUNTER — Other Ambulatory Visit: Payer: Self-pay

## 2021-08-15 ENCOUNTER — Ambulatory Visit: Payer: Medicare Other | Admitting: Family Medicine

## 2021-08-15 ENCOUNTER — Ambulatory Visit (INDEPENDENT_AMBULATORY_CARE_PROVIDER_SITE_OTHER): Payer: Medicare Other | Admitting: Family Medicine

## 2021-08-15 ENCOUNTER — Encounter: Payer: Self-pay | Admitting: Family Medicine

## 2021-08-15 VITALS — BP 155/70 | HR 58 | Temp 98.5°F | Resp 18 | Ht 61.0 in | Wt 187.0 lb

## 2021-08-15 DIAGNOSIS — J309 Allergic rhinitis, unspecified: Secondary | ICD-10-CM

## 2021-08-15 DIAGNOSIS — E538 Deficiency of other specified B group vitamins: Secondary | ICD-10-CM | POA: Diagnosis not present

## 2021-08-15 DIAGNOSIS — K219 Gastro-esophageal reflux disease without esophagitis: Secondary | ICD-10-CM

## 2021-08-15 DIAGNOSIS — R7303 Prediabetes: Secondary | ICD-10-CM | POA: Diagnosis not present

## 2021-08-15 DIAGNOSIS — E7800 Pure hypercholesterolemia, unspecified: Secondary | ICD-10-CM

## 2021-08-15 DIAGNOSIS — I10 Essential (primary) hypertension: Secondary | ICD-10-CM | POA: Diagnosis not present

## 2021-08-15 DIAGNOSIS — Z23 Encounter for immunization: Secondary | ICD-10-CM | POA: Diagnosis not present

## 2021-08-15 MED ORDER — OLMESARTAN-AMLODIPINE-HCTZ 40-10-25 MG PO TABS: 1.0000 | ORAL_TABLET | Freq: Every day | ORAL | 1 refills | Status: DC

## 2021-08-15 NOTE — Assessment & Plan Note (Signed)
Recheck a1c. °

## 2021-08-15 NOTE — Patient Instructions (Signed)
It was great to see you!  Our plans for today:  - No changes to your medications.  - Get your shingles shot and COVID vaccines at your pharmacy.  We are checking some labs today, we will release these results to your MyChart.  Take care and seek immediate care sooner if you develop any concerns.   Dr. Ky Barban

## 2021-08-15 NOTE — Assessment & Plan Note (Signed)
Doing well on current regimen, no changes made today. 

## 2021-08-15 NOTE — Progress Notes (Signed)
   SUBJECTIVE:   CHIEF COMPLAINT / HPI:   Hypertension: - Medications: labetalol TID, olmesartan-amlodipine-HCTZ - Compliance: good - Checking BP at home: sometimes, 140-170s. 118 lowest. - Denies any SOB, CP, vision changes, LE edema, medication SEs, or symptoms of hypotension - sometimes will get lightheaded with changing positions.  HLD - medications: simvastatin - compliance: good, doing well with 1/2 pill.  - medication SEs: had bone aches with whole pill  GERD - Meds: protonix prn - Symptoms:  heartburn.   - denies dysphagia has not lost weight denies melena, hematochezia, hematemesis, and coffee ground emesis.  - Previous treatment: H2 antagonists and proton pump inhibitors.  CKD - follows with Nephro. UTD with PNA vaccine. On ARB. Hasn't had to take torsemide.   Mixed incontinence - follows with Urology, last appointment 07/2018. Previously on oxybutynin, detrol, myrbetriq but did not notice much effect. Declines surgery.   Seasonal allergies - recently switched from allegra to mucinex. Not noticing much effect.   OBJECTIVE:   BP (!) 155/70 (BP Location: Right Arm, Patient Position: Sitting, Cuff Size: Large)   Pulse (!) 58   Temp 98.5 F (36.9 C) (Temporal)   Resp 18   Ht 5\' 1"  (1.549 m)   Wt 187 lb (84.8 kg)   SpO2 96%   BMI 35.33 kg/m   Gen: elderly, well appearing, in NAD Card: RRR Lungs: CTAB Ext: WWP, no edema   ASSESSMENT/PLAN:   Accelerated essential hypertension Age-appropriate on current regimen. No changes today. Obtaining labs.   GERD (gastroesophageal reflux disease) Doing well on current regimen, no changes made today.  Borderline diabetes Recheck a1c.  Hypercholesteremia Doing well on current regimen, no changes made today.  Allergic rhinitis Trial of other 2nd generation antihistamine, zyrtec vs xyzal vs claritin.   F/u in 6 months.  Myles Gip, DO

## 2021-08-15 NOTE — Assessment & Plan Note (Signed)
Age-appropriate on current regimen. No changes today. Obtaining labs.

## 2021-08-15 NOTE — Assessment & Plan Note (Signed)
Trial of other 2nd generation antihistamine, zyrtec vs xyzal vs claritin.

## 2021-08-16 LAB — BASIC METABOLIC PANEL
BUN/Creatinine Ratio: 19 (ref 12–28)
BUN: 30 mg/dL — ABNORMAL HIGH (ref 8–27)
CO2: 21 mmol/L (ref 20–29)
Calcium: 9.6 mg/dL (ref 8.7–10.3)
Chloride: 97 mmol/L (ref 96–106)
Creatinine, Ser: 1.55 mg/dL — ABNORMAL HIGH (ref 0.57–1.00)
Glucose: 90 mg/dL (ref 70–99)
Potassium: 4.3 mmol/L (ref 3.5–5.2)
Sodium: 134 mmol/L (ref 134–144)
eGFR: 33 mL/min/{1.73_m2} — ABNORMAL LOW (ref >59)

## 2021-08-16 LAB — HEMOGLOBIN A1C
Est. average glucose Bld gHb Est-mCnc: 117 mg/dL
Hgb A1c MFr Bld: 5.7 % — ABNORMAL HIGH (ref 4.8–5.6)

## 2021-08-16 LAB — VITAMIN B12: Vitamin B-12: >2000 pg/mL — ABNORMAL HIGH (ref 232–1245)

## 2021-10-10 ENCOUNTER — Ambulatory Visit (INDEPENDENT_AMBULATORY_CARE_PROVIDER_SITE_OTHER): Payer: Medicare Other | Admitting: Dermatology

## 2021-10-10 ENCOUNTER — Other Ambulatory Visit: Payer: Self-pay

## 2021-10-10 DIAGNOSIS — D692 Other nonthrombocytopenic purpura: Secondary | ICD-10-CM | POA: Diagnosis not present

## 2021-10-10 DIAGNOSIS — L820 Inflamed seborrheic keratosis: Secondary | ICD-10-CM | POA: Diagnosis not present

## 2021-10-10 DIAGNOSIS — L570 Actinic keratosis: Secondary | ICD-10-CM | POA: Diagnosis not present

## 2021-10-10 DIAGNOSIS — L578 Other skin changes due to chronic exposure to nonionizing radiation: Secondary | ICD-10-CM

## 2021-10-10 DIAGNOSIS — L821 Other seborrheic keratosis: Secondary | ICD-10-CM

## 2021-10-10 DIAGNOSIS — Z808 Family history of malignant neoplasm of other organs or systems: Secondary | ICD-10-CM | POA: Diagnosis not present

## 2021-10-10 NOTE — Patient Instructions (Addendum)
Actinic keratoses are precancerous spots that appear secondary to cumulative UV radiation exposure/sun exposure over time. They are chronic with expected duration over 1 year. A portion of actinic keratoses will progress to squamous cell carcinoma of the skin. It is not possible to reliably predict which spots will progress to skin cancer and so treatment is recommended to prevent development of skin cancer.  Recommend daily broad spectrum sunscreen SPF 30+ to sun-exposed areas, reapply every 2 hours as needed.  Recommend staying in the shade or wearing long sleeves, sun glasses (UVA+UVB protection) and wide brim hats (4-inch brim around the entire circumference of the hat). Call for new or changing lesions.   Cryotherapy Aftercare  Wash gently with soap and water everyday.   Apply Vaseline and Band-Aid daily until healed.   Seborrheic Keratosis  What causes seborrheic keratoses? Seborrheic keratoses are harmless, common skin growths that first appear during adult life.  As time goes by, more growths appear.  Some people may develop a large number of them.  Seborrheic keratoses appear on both covered and uncovered body parts.  They are not caused by sunlight.  The tendency to develop seborrheic keratoses can be inherited.  They vary in color from skin-colored to gray, brown, or even black.  They can be either smooth or have a rough, warty surface.   Seborrheic keratoses are superficial and look as if they were stuck on the skin.  Under the microscope this type of keratosis looks like layers upon layers of skin.  That is why at times the top layer may seem to fall off, but the rest of the growth remains and re-grows.    Treatment Seborrheic keratoses do not need to be treated, but can easily be removed in the office.  Seborrheic keratoses often cause symptoms when they rub on clothing or jewelry.  Lesions can be in the way of shaving.  If they become inflamed, they can cause itching, soreness, or  burning.  Removal of a seborrheic keratosis can be accomplished by freezing, burning, or surgery. If any spot bleeds, scabs, or grows rapidly, please return to have it checked, as these can be an indication of a skin cancer.   If You Need Anything After Your Visit  If you have any questions or concerns for your doctor, please call our main line at [phone] and press option 4 to reach your doctor's medical assistant. If no one answers, please leave a voicemail as directed and we will return your call as soon as possible. Messages left after 4 pm will be answered the following business day.   You may also send [place] a message via [place]. We typically respond to MyChart messages within 1-2 business days.  For prescription refills, please ask your pharmacy to contact our office. Our fax number is [phone].  If you have an urgent issue when the clinic is closed that cannot wait until the next business day, you can page your doctor at the number below.    Please note that while we do our best to be available for urgent issues outside of office hours, we are not available 24/7.   If you have an urgent issue and are unable to reach [place], you may choose to seek medical care at your doctor's office, retail clinic, urgent care center, or emergency room.  If you have a medical emergency, please immediately call 911 or go to the emergency department.  Pager Numbers  - Dr. Nehemiah Massed: [phone]  - Dr. Laurence Ferrari: [phone]  -  Dr. Nicole Kindred: [phone]  In the event of inclement weather, please call our main line at [phone] for an update on the status of any delays or closures.  Dermatology Medication Tips: Please keep the boxes that topical medications come in in order to help keep track of the instructions about where and how to use these. Pharmacies typically print the medication instructions only on the boxes and not directly on the medication tubes.   If your medication is too  expensive, please contact our office at [phone] option 4 or send [place] a message through [place].   We are unable to tell what your co-pay for medications will be in advance as this is different depending on your insurance coverage. However, we may be able to find a substitute medication at lower cost or fill out paperwork to get insurance to cover a needed medication.   If a prior authorization is required to get your medication covered by your insurance company, please allow [place] 1-2 business days to complete this process.  Drug prices often vary depending on where the prescription is filled and some pharmacies may offer cheaper prices.  The website www.goodrx.com contains coupons for medications through different pharmacies. The prices here do not account for what the cost may be with help from insurance (it may be cheaper with your insurance), but the website can give you the price if you did not use any insurance.  - You can print the associated coupon and take it with your prescription to the pharmacy.  - You may also stop by our office during regular business hours and pick up a GoodRx coupon card.  - If you need your prescription sent electronically to a different pharmacy, notify our office through [facility] or by phone at [phone] option 4.     Si Usted Necesita Algo Despus de Su Visita  Tambin puede enviarnos un mensaje a travs de Pharmacist, community. Por lo general respondemos a los mensajes de MyChart en el transcurso de 1 a 2 das hbiles.  Para renovar recetas, por favor pida a su farmacia que se ponga en contacto con nuestra oficina. Harland Dingwall de fax es [place] [phone].  Si tiene un asunto urgente cuando la clnica est cerrada y que no puede esperar hasta el siguiente da hbil, puede llamar/localizar a su doctor(a) al nmero que aparece a continuacin.   Por favor, tenga en cuenta que aunque hacemos todo lo posible para estar disponibles para asuntos urgentes fuera  del horario de [place], no estamos disponibles las 24 horas del da, los 7 das de la [place].   Si tiene un problema urgente y no puede comunicarse con nosotros, puede optar por buscar atencin mdica  en el consultorio de su doctor(a), en una clnica privada, en un centro de atencin urgente o en una sala de emergencias.  Si tiene Engineering geologist, por favor llame inmediatamente al 911 o vaya a la sala de emergencias.  Nmeros de bper  - Dr. Nehemiah Massed: [phone]  - Dra. Moye: [phone]  - Dra. Nicole Kindred: [phone]  En caso de inclemencias del [place], por favor llame a Johnsie Kindred principal al [phone] para una actualizacin sobre el [place] de cualquier retraso o cierre.  Consejos para la medicacin en dermatologa: Por favor, guarde las cajas en las que vienen los medicamentos de uso tpico para ayudarle a seguir las instrucciones sobre dnde y cmo usarlos. Las farmacias generalmente imprimen las instrucciones del medicamento slo en las cajas y no directamente en los tubos  del medicamento.   Si su medicamento es muy caro, por favor, pngase en contacto con Zigmund Daniel llamando al [phone] y presione la opcin 4 o envenos un mensaje a travs de Pharmacist, community.   No podemos decirle cul ser su copago por los medicamentos por adelantado ya que esto es diferente dependiendo de la cobertura de su seguro. Sin embargo, es posible que podamos encontrar un medicamento sustituto a Electrical engineer un formulario para que el seguro cubra el medicamento que se considera necesario.   Si se requiere una autorizacin previa para que su compaa de seguros [place] su medicamento, por favor permtanos de 1 a 2 das hbiles para completar este proceso.  Los precios de los medicamentos varan con frecuencia dependiendo del Environmental consultant de dnde se surte la receta y alguna farmacias pueden ofrecer precios ms baratos.  El sitio web www.goodrx.com tiene cupones para medicamentos de Office manager. Los precios aqu no tienen en cuenta lo que podra costar con la ayuda del seguro (puede ser ms barato con su seguro), pero el sitio web puede darle el precio si no utiliz Research scientist (physical sciences).  - Puede imprimir el cupn correspondiente y llevarlo con su receta a la farmacia.  - Tambin puede pasar por nuestra oficina durante el horario de atencin regular y Charity fundraiser una tarjeta de cupones de GoodRx.  - Si necesita que su receta se enve electrnicamente a una farmacia diferente, informe a nuestra oficina a travs de MyChart de [place] o por telfono llamando al [phone] y presione la opcin 4.

## 2021-10-10 NOTE — Progress Notes (Signed)
New Patient Visit  Subjective  Dawn [name] is a 88 y.o. female who presents for the following: New Patient (Initial Visit) (Patient here today for regarding spots at arms and some areas behind right arm she would like checked. Patient's pcp recommended follow up to view areas at arms and legs. Patient has family history of skin cancer. ). The patient has spots, moles and lesions to be evaluated, some may be new or changing and the patient has concerns that these could be cancer.  The following portions of the chart were reviewed this encounter and updated as appropriate:   Tobacco   Allergies   Meds   Problems   Med Hx   Surg Hx   Fam Hx      Review of Systems:  No other skin or systemic complaints except as noted in HPI or Assessment and Plan.  Objective  Well appearing patient in no apparent distress; mood and affect are within normal limits.  A focused examination was performed including arms, right knee, right lower leg, face. Relevant physical exam findings are noted in the Assessment and Plan.  right arm x 3, right temple x 1, left forehead x 1, left brow x 1 (6) Erythematous stuck-on, waxy papule or plaque  right lateral knee x 1, right distal pretibial x 1 (2) Erythematous thin papules/macules with gritty scale.    Assessment & Plan   Seborrheic Keratoses - Stuck-on, waxy, tan-brown papules and/or plaques  - Benign-appearing - Discussed benign etiology and prognosis. - Observe - Call for any changes  Inflamed seborrheic keratosis (6) right arm x 3, right temple x 1, left forehead x 1, left brow x 1  Irritated   Destruction of lesion - right arm x 3, right temple x 1, left forehead x 1, left brow x 1 Complexity: simple   Destruction method: cryotherapy   Informed consent: discussed and consent obtained   Timeout:  patient name, date of birth, surgical site, and procedure verified Lesion destroyed using liquid nitrogen: Yes   Region frozen until ice ball  extended beyond lesion: Yes   Outcome: patient tolerated procedure well with no complications   Post-procedure details: wound care instructions given   Additional details:  Prior to procedure, discussed risks of blister formation, small wound, skin dyspigmentation, or rare scar following cryotherapy. Recommend Vaseline ointment to treated areas while healing.   Actinic keratosis (2) right lateral knee x 1, right distal pretibial x 1  Will recheck both areas at next follow up in 3 months, if still present will plan to biopsy   Actinic keratoses are precancerous spots that appear secondary to cumulative UV radiation exposure/sun exposure over time. They are chronic with expected duration over 1 year. A portion of actinic keratoses will progress to squamous cell carcinoma of the skin. It is not possible to reliably predict which spots will progress to skin cancer and so treatment is recommended to prevent development of skin cancer.  Recommend daily broad spectrum sunscreen SPF 30+ to sun-exposed areas, reapply every 2 hours as needed.  Recommend staying in the shade or wearing long sleeves, sun glasses (UVA+UVB protection) and wide brim hats (4-inch brim around the entire circumference of the hat). Call for new or changing lesions.  Purpura - Chronic; persistent and recurrent.  Treatable, but not curable. - Violaceous macules and patches - Benign - Related to trauma, age, sun damage and/or use of blood thinners, chronic use of topical and/or oral steroids - Observe - Can  use OTC arnica containing moisturizer such as Dermend Bruise Formula if desired - Call for worsening or other concerns  Actinic Damage - chronic, secondary to cumulative UV radiation exposure/sun exposure over time - diffuse scaly erythematous macules with underlying dyspigmentation - Recommend daily broad spectrum sunscreen SPF 30+ to sun-exposed areas, reapply every 2 hours as needed.  - Recommend staying in the shade  or wearing long sleeves, sun glasses (UVA+UVB protection) and wide brim hats (4-inch brim around the entire circumference of the hat). - Call for new or changing lesions.  Return for 3 month ak follow up .  IRuthell Rummage, CMA, am acting as scribe for Sarina Ser, MD. Documentation: I have reviewed the above documentation for accuracy and completeness, and I agree with the above.  Sarina Ser, MD

## 2021-10-11 ENCOUNTER — Encounter: Payer: Self-pay | Admitting: Dermatology

## 2021-10-23 ENCOUNTER — Other Ambulatory Visit: Payer: Self-pay | Admitting: Family Medicine

## 2021-10-23 DIAGNOSIS — K219 Gastro-esophageal reflux disease without esophagitis: Secondary | ICD-10-CM

## 2021-10-25 DIAGNOSIS — Z20822 Contact with and (suspected) exposure to covid-19: Secondary | ICD-10-CM | POA: Diagnosis not present

## 2021-11-22 ENCOUNTER — Other Ambulatory Visit: Payer: Self-pay | Admitting: Family Medicine

## 2021-11-22 DIAGNOSIS — N1832 Chronic kidney disease, stage 3b: Secondary | ICD-10-CM | POA: Diagnosis not present

## 2021-11-22 DIAGNOSIS — R600 Localized edema: Secondary | ICD-10-CM | POA: Diagnosis not present

## 2021-11-22 DIAGNOSIS — I10 Essential (primary) hypertension: Secondary | ICD-10-CM | POA: Diagnosis not present

## 2021-11-23 DIAGNOSIS — Z20822 Contact with and (suspected) exposure to covid-19: Secondary | ICD-10-CM | POA: Diagnosis not present

## 2021-11-23 NOTE — Telephone Encounter (Signed)
Requested Prescriptions  ?Pending Prescriptions Disp Refills  ?? labetalol (NORMODYNE) 200 MG tablet [Pharmacy Med Name: Labetalol HCl 200 MG Oral Tablet] 270 tablet 1  ?  Sig: TAKE 1 TABLET BY MOUTH 3  TIMES DAILY  ?  ? Cardiovascular:  Beta Blockers Failed - 11/22/2021 11:37 PM  ?  ?  Failed - Last BP in normal range  ?  BP Readings from Last 1 Encounters:  ?08/15/21 (!) 155/70  ?   ?  ?  Passed - Last Heart Rate in normal range  ?  Pulse Readings from Last 1 Encounters:  ?08/15/21 (!) 58  ?   ?  ?  Passed - Valid encounter within last 6 months  ?  Recent Outpatient Visits   ?      ? 3 months ago Accelerated essential hypertension  ? [place], DO  ? 7 months ago Primary hypertension  ? [facility] Jerrol Banana., MD  ? 11 months ago Essential hypertension  ? [facility] Jerrol Banana., MD  ? 1 year ago Need for immunization against influenza  ? [facility] Jerrol Banana., MD  ? 1 year ago Vitamin B 12 deficiency  ? [facility] Jerrol Banana., MD  ?  ?  ?Future Appointments   ?        ? In 1 month Ralene Bathe, MD [place]  ?  ? ?  ?  ?  ? ?

## 2021-11-24 DIAGNOSIS — R600 Localized edema: Secondary | ICD-10-CM | POA: Diagnosis not present

## 2021-11-24 DIAGNOSIS — I10 Essential (primary) hypertension: Secondary | ICD-10-CM | POA: Diagnosis not present

## 2021-11-24 DIAGNOSIS — N1832 Chronic kidney disease, stage 3b: Secondary | ICD-10-CM | POA: Diagnosis not present

## 2021-11-29 DIAGNOSIS — N1832 Chronic kidney disease, stage 3b: Secondary | ICD-10-CM | POA: Diagnosis not present

## 2021-11-29 DIAGNOSIS — I10 Essential (primary) hypertension: Secondary | ICD-10-CM | POA: Diagnosis not present

## 2021-11-29 DIAGNOSIS — N261 Atrophy of kidney (terminal): Secondary | ICD-10-CM | POA: Diagnosis not present

## 2021-11-29 DIAGNOSIS — R600 Localized edema: Secondary | ICD-10-CM | POA: Diagnosis not present

## 2021-11-29 DIAGNOSIS — N1330 Unspecified hydronephrosis: Secondary | ICD-10-CM | POA: Diagnosis not present

## 2021-12-02 DIAGNOSIS — Z01812 Encounter for preprocedural laboratory examination: Secondary | ICD-10-CM | POA: Diagnosis not present

## 2021-12-04 ENCOUNTER — Other Ambulatory Visit: Payer: Self-pay | Admitting: Family Medicine

## 2021-12-13 ENCOUNTER — Ambulatory Visit (INDEPENDENT_AMBULATORY_CARE_PROVIDER_SITE_OTHER): Payer: Medicare Other

## 2021-12-13 VITALS — Wt 187.0 lb

## 2021-12-13 DIAGNOSIS — Z0000 Encounter for general adult medical examination without abnormal findings: Secondary | ICD-10-CM

## 2021-12-13 DIAGNOSIS — I700 Atherosclerosis of aorta: Secondary | ICD-10-CM | POA: Insufficient documentation

## 2021-12-13 NOTE — Patient Instructions (Signed)
Dawn [name] , ?Thank you for taking time to come for your Medicare Wellness Visit. I appreciate your ongoing commitment to your health goals. Please review the following plan we discussed and let me know if I can assist you in the future.  ? ?Screening recommendations/referrals: ?Colonoscopy: aged out ?Mammogram: aged out ?Bone Density: aged out ?Recommended yearly ophthalmology/optometry visit for glaucoma screening and checkup ?Recommended yearly dental visit for hygiene and checkup ? ?Vaccinations: ?Influenza vaccine: 08/15/21 ?Pneumococcal vaccine: 09/11/16 ?Tdap vaccine: 01/09/13 ?Shingles vaccine: n/c   ?Covid-19:01/17/20, 02/15/20 ? ?Advanced directives: no ? ?Conditions/risks identified: none ? ?Next appointment: Follow up in one year for your annual wellness visit 12/19/22 @ 2:45pm by phone ? ? ?Preventive Care 34 Years and Older, Female ?Preventive care refers to lifestyle choices and visits with your health care provider that can promote health and wellness. ?What does preventive care include? ?A yearly physical exam. This is also called an annual well check. ?Dental exams once or twice a year. ?Routine eye exams. Ask your health care provider how often you should have your eyes checked. ?Personal lifestyle choices, including: ?Daily care of your teeth and gums. ?Regular physical activity. ?Eating a healthy diet. ?Avoiding tobacco and drug use. ?Limiting alcohol use. ?Practicing safe sex. ?Taking low-dose aspirin every day. ?Taking vitamin and mineral supplements as recommended by your health care provider. ?What happens during an annual well check? ?The services and screenings done by your health care provider during your annual well check will depend on your age, overall health, lifestyle risk factors, and family history of disease. ?Counseling  ?Your health care provider may ask you questions about your: ?Alcohol use. ?Tobacco use. ?Drug use. ?Emotional well-being. ?Home and relationship well-being. ?Sexual  activity. ?Eating habits. ?History of falls. ?Memory and ability to understand (cognition). ?Work and work Statistician. ?Reproductive health. ?Screening  ?You may have the following tests or measurements: ?Height, weight, and BMI. ?Blood pressure. ?Lipid and cholesterol levels. These may be checked every 5 years, or more frequently if you are over 48 years old. ?Skin check. ?Lung cancer screening. You may have this screening every year starting at age 68 if you have a 30-pack-year history of smoking and currently smoke or have quit within the past 15 years. ?Fecal occult blood test (FOBT) of the stool. You may have this test every year starting at age 38. ?Flexible sigmoidoscopy or colonoscopy. You may have a sigmoidoscopy every 5 years or a colonoscopy every 10 years starting at age 25. ?Hepatitis C blood test. ?Hepatitis B blood test. ?Sexually transmitted disease (STD) testing. ?Diabetes screening. This is done by checking your blood sugar (glucose) after you have not eaten for a while (fasting). You may have this done every 1-3 years. ?Bone density scan. This is done to screen for osteoporosis. You may have this done starting at age 49. ?Mammogram. This may be done every 1-2 years. Talk to your health care provider about how often you should have regular mammograms. ?Talk with your health care provider about your test results, treatment options, and if necessary, the need for more tests. ?Vaccines  ?Your health care provider may recommend certain vaccines, such as: ?Influenza vaccine. This is recommended every year. ?Tetanus, diphtheria, and acellular pertussis (Tdap, Td) vaccine. You may need a Td booster every 10 years. ?Zoster vaccine. You may need this after age 64. ?Pneumococcal 13-valent conjugate (PCV13) vaccine. One dose is recommended after age 55. ?Pneumococcal polysaccharide (PPSV23) vaccine. One dose is recommended after age 74. ?Talk to your health  care provider about which screenings and vaccines  you need and how often you need them. ?This information is not intended to replace advice given to you by your health care provider. Make sure you discuss any questions you have with your health care provider. ?Document Released: 09/24/2015 Document Revised: 05/17/2016 Document Reviewed: 06/29/2015 ?Elsevier Interactive Patient Education ? 2017 [place]. ? ?Fall Prevention in the Home ?Falls can cause injuries. They can happen to people of all ages. There are many things you can do to make your home safe and to help prevent falls. ?What can I do on the outside of my home? ?Regularly fix the edges of walkways and driveways and fix any cracks. ?Remove anything that might make you trip as you walk through a door, such as a raised step or threshold. ?Trim any bushes or trees on the path to your home. ?Use bright outdoor lighting. ?Clear any walking paths of anything that might make someone trip, such as rocks or tools. ?Regularly check to see if handrails are loose or broken. Make sure that both sides of any steps have handrails. ?Any raised decks and porches should have guardrails on the edges. ?Have any leaves, snow, or ice cleared regularly. ?Use sand or salt on walking paths during winter. ?Clean up any spills in your garage right away. This includes oil or grease spills. ?What can I do in the bathroom? ?Use night lights. ?Install grab bars by the toilet and in the tub and shower. Do not use towel bars as grab bars. ?Use non-skid mats or decals in the tub or shower. ?If you need to sit down in the shower, use a plastic, non-slip stool. ?Keep the floor dry. Clean up any water that spills on the floor as soon as it happens. ?Remove soap buildup in the tub or shower regularly. ?Attach bath mats securely with double-sided non-slip rug tape. ?Do not have throw rugs and other things on the floor that can make you trip. ?What can I do in the bedroom? ?Use night lights. ?Make sure that you have a light by your bed that  is easy to reach. ?Do not use any sheets or blankets that are too big for your bed. They should not hang down onto the floor. ?Have a firm chair that has side arms. You can use this for support while you get dressed. ?Do not have throw rugs and other things on the floor that can make you trip. ?What can I do in the kitchen? ?Clean up any spills right away. ?Avoid walking on wet floors. ?Keep items that you use a lot in easy-to-reach places. ?If you need to reach something above you, use a strong step stool that has a grab bar. ?Keep electrical cords out of the way. ?Do not use floor polish or wax that makes floors slippery. If you must use wax, use non-skid floor wax. ?Do not have throw rugs and other things on the floor that can make you trip. ?What can I do with my stairs? ?Do not leave any items on the stairs. ?Make sure that there are handrails on both sides of the stairs and use them. Fix handrails that are broken or loose. Make sure that handrails are as long as the stairways. ?Check any carpeting to make sure that it is firmly attached to the stairs. Fix any carpet that is loose or worn. ?Avoid having throw rugs at the top or bottom of the stairs. If you do have throw rugs, attach them to  the floor with carpet tape. ?Make sure that you have a light switch at the top of the stairs and the bottom of the stairs. If you do not have them, ask someone to add them for you. ?What else can I do to help prevent falls? ?Wear shoes that: ?Do not have high heels. ?Have rubber bottoms. ?Are comfortable and fit you well. ?Are closed at the toe. Do not wear sandals. ?If you use a stepladder: ?Make sure that it is fully opened. Do not climb a closed stepladder. ?Make sure that both sides of the stepladder are locked into place. ?Ask someone to hold it for you, if possible. ?Clearly mark and make sure that you can see: ?Any grab bars or handrails. ?First and last steps. ?Where the edge of each step is. ?Use tools that help you  move around (mobility aids) if they are needed. These include: ?Canes. ?Walkers. ?Scooters. ?Crutches. ?Turn on the lights when you go into a dark area. Replace any light bulbs as soon as they burn out. ?Se

## 2021-12-13 NOTE — Progress Notes (Signed)
Virtual Visit via Telephone Note  I connected with  Dawn [name] on 12/13/21 at  1:45 PM EDT by telephone and verified that I am speaking with the correct person using two identifiers.  Location: Patient: home Provider: [facility] Persons participating in the virtual visit: Emma   I discussed the limitations, risks, security and privacy concerns of performing an evaluation and management service by telephone and the availability of in person appointments. The patient expressed understanding and agreed to proceed.  Interactive audio and video telecommunications were attempted between this nurse and patient, however failed, due to patient having technical difficulties OR patient did not have access to video capability.  We continued and completed visit with audio only.  Some vital signs may be absent or patient reported.   Dionisio David, LPN  Subjective:   Dawn [name] is a 88 y.o. female who presents for Medicare Annual (Subsequent) preventive examination.  Review of Systems           Objective:    There were no vitals filed for this visit. There is no height or weight on file to calculate BMI.     01/15/2021    2:23 PM 07/13/2020    2:45 PM 07/08/2019    2:34 PM 07/02/2018    3:12 PM 06/28/2017    2:52 PM 02/13/2017   11:47 AM 09/05/2016    3:58 PM  Advanced Directives  Does Patient Have a Medical Advance Directive? No No No No No No No  Would patient like information on creating a medical advance directive?  No - Patient declined Yes (ED - Information included in AVS) No - Patient declined No - Patient declined No - Patient declined     Current Medications (verified) Outpatient Encounter Medications as of 12/13/2021  Medication Sig   labetalol (NORMODYNE) 200 MG tablet Take by mouth.   simvastatin (ZOCOR) 20 MG tablet 1 (one) time each day   acetaminophen (TYLENOL) 325 MG tablet Take 650 mg by mouth every 6 (six) hours as needed for mild pain.    aspirin EC 81 MG tablet Take 81 mg by mouth daily.   Biotin 5000 MCG CAPS Take 5,000 mcg by mouth daily.   BIOTIN SL Place under the tongue.   cetirizine (ZYRTEC) 10 MG tablet Take 1 tablet (10 mg total) by mouth daily.   clotrimazole-betamethasone (LOTRISONE) cream Apply 1 application topically daily.   labetalol (NORMODYNE) 200 MG tablet TAKE 1 TABLET BY MOUTH 3  TIMES DAILY   Lysine 500 MG CAPS Take 1 capsule by mouth daily.   Lysine HCl 500 MG CAPS Take 500 mg by mouth daily.    Magnesium 250 MG TABS Take 1 tablet by mouth daily.    Magnesium 500 MG TABS Take by mouth.   Olmesartan-amLODIPine-HCTZ 40-10-25 MG TABS Take 1 tablet by mouth daily.   pantoprazole (PROTONIX) 20 MG tablet TAKE 1 TABLET(20 MG) BY MOUTH DAILY   simvastatin (ZOCOR) 20 MG tablet TAKE 1 TABLET BY MOUTH  DAILY   torsemide (DEMADEX) 5 MG tablet Take 5 mg by mouth daily. As needed with leg swelling   vitamin B-12 (CYANOCOBALAMIN) 1000 MCG tablet Take 1,000 mcg by mouth daily.   No facility-administered encounter medications on file as of 12/13/2021.    Allergies (verified) Quinine, Levofloxacin, Statins, Celecoxib, and Codeine   History: Past Medical History:  Diagnosis Date   Acid reflux    Arthritis    Back pain    Gastric polyp 2014  Hypertension    Stomach ulcer    Stroke [facility])    Past Surgical History:  Procedure Laterality Date   BACK SURGERY  2011   vertebroplasty    CARDIAC CATHETERIZATION  2007   normal, [employer] in [place]  2007   cataract surgery   2013   COLON SURGERY  2015   lap sigmoid resection   COLONOSCOPY  2011, 2015   Dr. Nicolasa Ducking, Dr Melody Comas   LAMINECTOMY  2011   decompression    UPPER GI ENDOSCOPY  2011   VASCULAR SURGERY Right 2010   right rental artery    Family History  Problem Relation Age of Onset   Cancer Father        stomach   Emphysema Father    Hypertension Mother    Atrial fibrillation Mother    Diabetes Sister     Hypertension Sister    Skin cancer Sister    Hypertension Brother    Heart disease Brother    Heart disease Sister    Colon polyps Sister    Hypertension Sister    Thyroid disease Sister    Irritable bowel syndrome Sister    Breast cancer Sister    Cancer Sister        breast   Heart disease Brother    Colon polyps Brother    Cancer Brother        stomach   Diabetes Brother    Hypertension Brother    Neuropathy Brother    Post-traumatic stress disorder Brother    Hypertension Brother    Pneumonia Brother    Breast cancer Paternal Aunt    Breast cancer Paternal Grandmother    Breast cancer Paternal Aunt    Breast cancer Paternal Aunt    Breast cancer Paternal Aunt    Breast cancer Other    Social History   Socioeconomic History   Marital status: Divorced    Spouse name: none   Number of children: 1   Years of education: 13   Highest education level: Some college, no degree  Occupational History   Occupation: retired  Tobacco Use   Smoking status: Never   Smokeless tobacco: Never  Vaping Use   Vaping Use: Never used  Substance and Sexual Activity   Alcohol use: No   Drug use: No   Sexual activity: Never  Other Topics Concern   Not on file  Social History Narrative   Not on file   Social Determinants of Health   Financial Resource Strain: Not on file  Food Insecurity: Not on file  Transportation Needs: Not on file  Physical Activity: Not on file  Stress: Not on file  Social Connections: Not on file    Tobacco Counseling Counseling given: Not Answered   Clinical Intake:  Pre-visit preparation completed: Yes  Pain : No/denies pain     Nutritional Risks: None Diabetes: No  How often do you need to have someone help you when you read instructions, pamphlets, or other written materials from your doctor or pharmacy?: 1 - Never  Diabetic?no  Interpreter Needed?: No  Information entered by :: Kirke Shaggy, LPN   Activities of Daily  Living    08/15/2021    2:25 PM 03/31/2021   10:09 AM  In your present state of health, do you have any difficulty performing the following activities:  Hearing? 1 0  Vision? 0 0  Difficulty concentrating or making decisions? 0 0  Walking  or climbing stairs? 1 1  Dressing or bathing? 1 0  Doing errands, shopping? 1 1    Patient Care Team: Jerrol Banana., MD as PCP - General (Unknown Physician Specialty) Leandrew Koyanagi, MD as Referring Physician (Ophthalmology) Murlean Iba, MD (Nephrology) Corey Skains, MD as Consulting Physician (Cardiology)  Indicate any recent Medical Services you may have received from other than [facility] providers in the past year (date may be approximate).     Assessment:   This is a routine wellness examination for Dawn [name].  Hearing/Vision screen No results found.  Dietary issues and exercise activities discussed:     Goals Addressed   None    Depression Screen    08/15/2021    2:25 PM 03/31/2021   10:09 AM 07/07/2020    2:25 PM 07/08/2019    2:35 PM 07/02/2018    3:13 PM 06/28/2017    2:52 PM 06/15/2016    2:13 PM  PHQ 2/9 Scores  PHQ - 2 Score 0 0 0 '1 1 2 '$ 0  PHQ- 9 Score '2 3    4     '$ Fall Risk    08/15/2021    2:25 PM 03/31/2021   10:08 AM 07/13/2020    2:45 PM 07/08/2019    2:41 PM 07/08/2019    2:35 PM  Fall Risk   Falls in the past year? 0 0 1 0 0  Number falls in past yr: 0 0 0 0 0  Injury with Fall? 0 0 1 0 0  Comment   Broken both hands and had a concussion.    Risk for fall due to : No Fall Risks No Fall Risks     Follow up Falls evaluation completed Falls evaluation completed Falls prevention discussed      FALL RISK PREVENTION PERTAINING TO THE HOME:  Any stairs in or around the home? Yes  If so, are there any without handrails? No  Home free of loose throw rugs in walkways, pet beds, electrical cords, etc? Yes  Adequate lighting in your home to reduce risk of falls? Yes   ASSISTIVE DEVICES UTILIZED  TO PREVENT FALLS:  Life alert? No  Use of a cane, walker or w/c? Yes  Grab bars in the bathroom? Yes  Shower chair or bench in shower? Yes  Elevated toilet seat or a handicapped toilet? Yes   Cognitive Function:        07/07/2020    2:25 PM 06/28/2017    2:57 PM  6CIT Screen  What Year? 0 points 0 points  What month? 0 points 0 points  What time? 0 points 0 points  Count back from 20 0 points 0 points  Months in reverse 0 points 0 points  Repeat phrase 0 points 2 points  Total Score 0 points 2 points    Immunizations Immunization History  Administered Date(s) Administered   Fluad Quad(high Dose 65+) 06/11/2019, 07/07/2020, 08/15/2021   Influenza, High Dose Seasonal PF 06/16/2015, 06/29/2016, 06/28/2017, 07/02/2018   Influenza,inj,quad, With Preservative 09/11/2016   Moderna Sars-Covid-2 Vaccination 01/17/2020, 02/15/2020   Pneumococcal Conjugate-13 02/17/2015   Pneumococcal Polysaccharide-23 08/14/2012   Pneumococcal-Unspecified 09/11/2016    TDAP status: Up to date  Flu Vaccine status: Up to date  Pneumococcal vaccine status: Up to date  Covid-19 vaccine status: Completed vaccines  Qualifies for Shingles Vaccine? Yes   Zostavax completed No   Shingrix Completed?: No.    Education has been provided regarding the importance of this vaccine. Patient has  been advised to call insurance company to determine out of pocket expense if they have not yet received this vaccine. Advised may also receive vaccine at local pharmacy or Health Dept. Verbalized acceptance and understanding.  Screening Tests Health Maintenance  Topic Date Due   Zoster Vaccines- Shingrix (1 of 2) Never done   COVID-19 Vaccine (3 - Moderna risk series) 03/14/2020   INFLUENZA VACCINE  04/11/2022   TETANUS/TDAP  01/10/2023   Pneumonia Vaccine 66+ Years old  Completed   DEXA SCAN  Completed   HPV VACCINES  Aged Out    Health Maintenance  Health Maintenance Due  Topic Date Due   Zoster  Vaccines- Shingrix (1 of 2) Never done   COVID-19 Vaccine (3 - Moderna risk series) 03/14/2020    Colorectal cancer screening: No longer required.   Mammogram status: No longer required due to age.    Lung Cancer Screening: (Low Dose CT Chest recommended if Age 77-80 years, 30 pack-year currently smoking OR have quit w/in 15years.) does not qualify.    Additional Screening:  Hepatitis C Screening: does not qualify; Completed no  Vision Screening: Recommended annual ophthalmology exams for early detection of glaucoma and other disorders of the eye. Is the patient up to date with their annual eye exam?  Yes  Who is the provider or what is the name of the office in which the patient attends annual eye exams? [facility] If pt is not established with a provider, would they like to be referred to a provider to establish care? No .   Dental Screening: Recommended annual dental exams for proper oral hygiene  Community Resource Referral / Chronic Care Management: CRR required this visit?  No   CCM required this visit?  No      Plan:     I have personally reviewed and noted the following in the patient's chart:   Medical and social history Use of alcohol, tobacco or illicit drugs  Current medications and supplements including opioid prescriptions.  Functional ability and status Nutritional status Physical activity Advanced directives List of other physicians Hospitalizations, surgeries, and ER visits in previous 12 months Vitals Screenings to include cognitive, depression, and falls Referrals and appointments  In addition, I have reviewed and discussed with patient certain preventive protocols, quality metrics, and best practice recommendations. A written personalized care plan for preventive services as well as general preventive health recommendations were provided to patient.     Dionisio David, LPN   05/14/9029   Nurse Notes: none

## 2021-12-15 DIAGNOSIS — Z20822 Contact with and (suspected) exposure to covid-19: Secondary | ICD-10-CM | POA: Diagnosis not present

## 2021-12-20 DIAGNOSIS — S52502A Unspecified fracture of the lower end of left radius, initial encounter for closed fracture: Secondary | ICD-10-CM | POA: Diagnosis not present

## 2021-12-20 DIAGNOSIS — M25522 Pain in left elbow: Secondary | ICD-10-CM | POA: Diagnosis not present

## 2021-12-20 DIAGNOSIS — M25532 Pain in left wrist: Secondary | ICD-10-CM | POA: Insufficient documentation

## 2021-12-21 DIAGNOSIS — M25522 Pain in left elbow: Secondary | ICD-10-CM | POA: Insufficient documentation

## 2021-12-27 DIAGNOSIS — M25532 Pain in left wrist: Secondary | ICD-10-CM | POA: Diagnosis not present

## 2021-12-27 DIAGNOSIS — Z20822 Contact with and (suspected) exposure to covid-19: Secondary | ICD-10-CM | POA: Diagnosis not present

## 2022-01-02 DIAGNOSIS — Z20828 Contact with and (suspected) exposure to other viral communicable diseases: Secondary | ICD-10-CM | POA: Diagnosis not present

## 2022-01-04 DIAGNOSIS — S6292XA Unspecified fracture of left wrist and hand, initial encounter for closed fracture: Secondary | ICD-10-CM | POA: Diagnosis not present

## 2022-01-10 ENCOUNTER — Ambulatory Visit: Payer: Medicare Other | Admitting: Dermatology

## 2022-01-10 DIAGNOSIS — S52502A Unspecified fracture of the lower end of left radius, initial encounter for closed fracture: Secondary | ICD-10-CM | POA: Diagnosis not present

## 2022-01-12 ENCOUNTER — Telehealth: Payer: Self-pay | Admitting: Family Medicine

## 2022-01-12 ENCOUNTER — Ambulatory Visit (INDEPENDENT_AMBULATORY_CARE_PROVIDER_SITE_OTHER): Payer: Medicare Other | Admitting: Physician Assistant

## 2022-01-12 ENCOUNTER — Encounter: Payer: Self-pay | Admitting: Physician Assistant

## 2022-01-12 VITALS — BP 138/50 | Temp 97.7°F | Resp 16 | Wt 182.0 lb

## 2022-01-12 DIAGNOSIS — Z01818 Encounter for other preprocedural examination: Secondary | ICD-10-CM | POA: Insufficient documentation

## 2022-01-12 DIAGNOSIS — R7303 Prediabetes: Secondary | ICD-10-CM

## 2022-01-12 LAB — POCT URINALYSIS DIPSTICK
Bilirubin, UA: NEGATIVE
Glucose, UA: NEGATIVE
Ketones, UA: NEGATIVE
Leukocytes, UA: NEGATIVE
Nitrite, UA: NEGATIVE
Protein, UA: NEGATIVE
Spec Grav, UA: 1.02 (ref 1.010–1.025)
Urobilinogen, UA: 0.2 E.U./dL
pH, UA: 6 (ref 5.0–8.0)

## 2022-01-12 NOTE — Assessment & Plan Note (Signed)
Previous A1c 5.7 % in 08/2021  ?Will recheck today for monitoring and for surgical clearance ?Results to dictate further management  ?Follow up in 3 months for monitoring ? ?

## 2022-01-12 NOTE — Progress Notes (Deleted)
. ?   Assessment:  ? ?Current medications which may produce withdrawal symptoms if withheld perioperatively: . ? ? Plan:  ?1. Preoperative workup as follows ECG, hemoglobin, electrolytes, glucose, liver function studies. ?2. Change in medication regimen before surgery: none, continue medication regimen including morning of surgery, with sip of water. ?3. Prophylaxis for cardiac events with perioperative beta-blockers: {not indicated/consider:16698}. ?4. Invasive hemodynamic monitoring perioperatively: {not indicated/strongly advise:16699}. ?5. Deep vein thrombosis prophylaxis postoperatively:{not indicated/consider:16689}. ?6. Surveillance for postoperative MI with ECG immediately postoperatively and on postoperative days 1 and 2 AND troponin levels 24 hours postoperatively and on day 4 or hospital discharge (whichever comes first): {not indicated/strongly advise:16699}. ?7. Other measures: {preoperative recommendations:16691} ?

## 2022-01-12 NOTE — Assessment & Plan Note (Addendum)
Patient appears stable, alert and oriented today in office ?Awaiting repeat labs - drawn today, will fax results once available ?A1c, CBC, CMP collected in office today ?Patient is  Prediabetic, with CKD- last GFR was 33 and patient reports she has only one kidney ?EKG results were reviewed against prior EKGs -see above ?UA dipstick was overall normal  ?Medication list attached ?Most recent cardiology apt was 01/17/21- patient was found to have normal EF, mild mitral valve regurg and mild tricuspid valve regurg ?Atherosclerosis and bilateral carotid stenosis   ?Overall PE demonstrates she is stable and likely to have minimal surgical issues.  ? ? ?

## 2022-01-12 NOTE — Progress Notes (Signed)
?  ?I,Dawn [name],acting as a scribe for [employer], PA-C.,have documented all relevant documentation on the behalf of [name], PA-C,as directed by  [employer], PA-C while in the presence of [name] E [name], PA-C.  ? ? ?Established patient visit ? ? ?Patient: Dawn [name]   DOB: [birth year]   88 y.o. Female  MRN: [id number] ?Visit Date: 01/12/2022 ? ?Today's healthcare provider: Dani Gobble Emera Bussie, PA-C  ?Introduced myself to the patient as a Journalist, newspaper and provided education on APPs in clinical practice.  ? ? ?Chief Complaint  ?Patient presents with  ? Pre-op Exam  ? ?Subjective  ?  ?HPI  ? Subjective:  ? ? Dawn [name] is a 88 y.o. female who presents to the office today for a preoperative consultation at the request of surgeon [name] who plans on performing Left Distal Radius Fracture ORIF on May 5. This consultation is requested for the specific conditions prompting preoperative evaluation (i.e. because of potential affect on operative risk): [name] Planned anesthesia: general. The patient has the following known anesthesia issues: family history of problems with anesthesia and patient has never had issues with previous surgeries . Patients bleeding risk: remote history of abnormal bleeding (teeth were pulled 30 years ago and had difficulty with clotting) and use of Ca-channel blockers (see med list). Patient does have objections to receiving blood products if needed. ? ?She states her brother had some issues with anesthesia but she is not sure what this is comprised of or cause.  ?Most recent cardiology apt was 01/17/21- patient was found to have normal EF, mild mitral valve regurg and mild tricuspid valve regurg ?Atherosclerosis and bilateral carotid stenosis  ? ?Prediabetic range - most recent A1c of 5.7 -08/2021 - will recheck today.  ? ? ?Patient Active Problem List  ? Diagnosis Date Noted  ? Preoperative clearance 01/12/2022  ? Aortic atherosclerosis ([place]) 12/13/2021  ? Mild mitral regurgitation 11/17/2020   ? Mild tricuspid regurgitation 11/17/2020  ? Dizziness 04/13/2020  ? Hydronephrosis 01/26/2020  ? Chronic kidney disease, stage 3 unspecified ([place]) 01/26/2020  ? Edema of lower extremity 01/26/2020  ? Abnormal ECG 06/27/2018  ? Hypertension 07/02/2017  ? Carotid artery narrowing 08/09/2015  ? Bilateral carotid artery stenosis 08/09/2015  ? Syncope and collapse 07/30/2015  ? Allergic rhinitis 01/05/2015  ? Cerebral infarct ([place]) 01/05/2015  ? Accelerated essential hypertension 01/05/2015  ? Benign gastric polyp 01/05/2015  ? Hypercholesteremia 01/05/2015  ? Neuropathy 01/05/2015  ? Obesity 01/05/2015  ? Arthritis of knee, degenerative 01/05/2015  ? Borderline diabetes 01/05/2015  ? Temporary cerebral vascular dysfunction 01/05/2015  ? GERD (gastroesophageal reflux disease) 03/31/2013  ? Gastric polyp   ?  ? ? ?Medications: ?Outpatient Medications Prior to Visit  ?Medication Sig  ? acetaminophen (TYLENOL) 325 MG tablet Take 650 mg by mouth every 6 (six) hours as needed for mild pain.  ? aspirin EC 81 MG tablet Take 81 mg by mouth daily.  ? Biotin 5000 MCG CAPS Take 5,000 mcg by mouth daily.  ? BIOTIN SL Place under the tongue.  ? cetirizine (ZYRTEC) 10 MG tablet Take 1 tablet (10 mg total) by mouth daily.  ? cholecalciferol (VITAMIN D3) 25 MCG (1000 UNIT) tablet Take 1,000 Units by mouth daily.  ? clotrimazole-betamethasone (LOTRISONE) cream Apply 1 application topically daily.  ? labetalol (NORMODYNE) 200 MG tablet TAKE 1 TABLET BY MOUTH 3  TIMES DAILY  ? labetalol (NORMODYNE) 200 MG tablet Take by mouth.  ? Lysine 500 MG  CAPS Take 1 capsule by mouth daily.  ? Magnesium 250 MG TABS Take 1 tablet by mouth daily.   ? Magnesium 500 MG TABS Take by mouth.  ? Olmesartan-amLODIPine-HCTZ 40-10-25 MG TABS Take 1 tablet by mouth daily.  ? pantoprazole (PROTONIX) 20 MG tablet TAKE 1 TABLET(20 MG) BY MOUTH DAILY  ? simvastatin (ZOCOR) 20 MG tablet TAKE 1 TABLET BY MOUTH  DAILY  ? vitamin B-12 (CYANOCOBALAMIN) 1000 MCG tablet  Take 1,000 mcg by mouth daily.  ? [DISCONTINUED] torsemide (DEMADEX) 5 MG tablet Take 5 mg by mouth daily. As needed with leg swelling (Patient not taking: Reported on 01/12/2022)  ? ?No facility-administered medications prior to visit.  ? ? ?Review of Systems  ?Constitutional:  Negative for chills, diaphoresis and fever.  ?Respiratory:  Negative for cough, choking, shortness of breath and wheezing.   ?Cardiovascular:  Negative for chest pain, palpitations and leg swelling.  ?Genitourinary:  Negative for difficulty urinating, dysuria and enuresis.  ?Neurological:  Negative for dizziness, facial asymmetry and headaches.  ? ? ?  Objective  ?  ?BP (!) 138/50 (BP Location: Right Arm, Patient Position: Sitting, Cuff Size: Large)   Temp 97.7 ?F (36.5 ?C) (Oral)   Resp 16   Wt 182 lb (82.6 kg)   BMI 34.39 kg/m?  ? ? ?Physical Exam ?Vitals reviewed.  ?Constitutional:   ?   Appearance: Normal appearance. She is obese.  ?HENT:  ?   Head: Normocephalic and atraumatic.  ?   Mouth/Throat:  ?   Lips: Pink.  ?   Mouth: Mucous membranes are moist.  ?   Dentition: Has dentures.  ?   Pharynx: Oropharynx is clear. Uvula midline. No pharyngeal swelling, oropharyngeal exudate, posterior oropharyngeal erythema or uvula swelling.  ?   Tonsils: No tonsillar exudate or tonsillar abscesses.  ?Eyes:  ?   Conjunctiva/sclera: Conjunctivae normal.  ?   Pupils: Pupils are equal, round, and reactive to light.  ?Cardiovascular:  ?   Rate and Rhythm: Regular rhythm. Bradycardia present.  ?   Pulses: Normal pulses.     ?     Radial pulses are 2+ on the right side.  ?   Heart sounds: Normal heart sounds.  ?Pulmonary:  ?   Effort: Pulmonary effort is normal. No tachypnea or bradypnea.  ?   Breath sounds: Normal breath sounds and air entry. No decreased breath sounds, wheezing, rhonchi or rales.  ?Musculoskeletal:  ?   Cervical back: Normal range of motion and neck supple.  ?   Right lower leg: 1+ Edema present.  ?   Left lower leg: No edema.   ?Skin: ?   General: Skin is warm and dry.  ?Neurological:  ?   General: No focal deficit present.  ?   Mental Status: She is alert and oriented to person, place, and time.  ?   GCS: GCS eye subscore is 4. GCS verbal subscore is 5. GCS motor subscore is 6.  ?   Motor: No weakness.  ?Psychiatric:     ?   Attention and Perception: Attention and perception normal.     ?   Mood and Affect: Mood and affect normal.     ?   Speech: Speech normal.     ?   Behavior: Behavior normal. Behavior is cooperative.     ?   Thought Content: Thought content normal.     ?   Judgment: Judgment normal.  ?  ? ?Predictors of intubation difficulty: ? Morbid obesity?  yes -  ? Anatomically abnormal facies? no ? Short, thick neck? no ? Neck range of motion: normal ? Dentition: Edentulous- wears dentures  ? ?Cardiographics ?ECG: sinus bradycardia rate:58 , regular rhythm, no acute ST elevations or T wave inversions, no significant changes when compared to EKG from 01/15/2021 ? ?Lab Review  ?Lab Results  ?Component Value Date  ? NA 134 08/15/2021  ? NA 132 (L) 03/31/2021  ? NA 129 (L) 01/15/2021  ? NA 133 (L) 01/06/2020  ? NA 134 (L) 12/20/2013  ? NA 131 (L) 10/14/2013  ? NA 125 (L) 10/13/2013  ? K 4.3 08/15/2021  ? K 4.2 03/31/2021  ? K 3.9 01/15/2021  ? K 3.4 (L) 12/24/2013  ? K 3.2 (L) 12/23/2013  ? K 3.1 (L) 12/22/2013  ? CO2 21 08/15/2021  ? CO2 21 03/31/2021  ? CO2 23 01/15/2021  ? CO2 26 12/20/2013  ? CO2 27 10/14/2013  ? CO2 26 10/13/2013  ? BUN 30 (H) 08/15/2021  ? BUN 23 03/31/2021  ? BUN 25 (H) 01/15/2021  ? BUN 21 01/06/2020  ? BUN 12 12/20/2013  ? BUN 15 10/14/2013  ? BUN 19 (H) 10/13/2013  ? CREATININE 1.55 (H) 08/15/2021  ? CREATININE 1.50 (H) 03/31/2021  ? CREATININE 1.14 (H) 01/15/2021  ? CREATININE 1.38 (H) 07/02/2017  ? CREATININE 0.91 12/24/2013  ? CREATININE 1.32 (H) 12/20/2013  ? CREATININE 1.27 12/19/2013  ? GLUCOSE 90 08/15/2021  ? GLUCOSE 97 03/31/2021  ? GLUCOSE 102 (H) 01/15/2021  ? GLUCOSE 86 01/06/2020  ? GLUCOSE 102  (H) 07/02/2017  ? GLUCOSE 100 (H) 02/13/2017  ? GLUCOSE 119 (H) 12/20/2013  ? GLUCOSE 95 10/14/2013  ? GLUCOSE 170 (H) 10/13/2013  ? CALCIUM 9.6 08/15/2021  ? CALCIUM 10.0 03/31/2021  ? CALCIUM 9.5 01/15/2021  ? C

## 2022-01-12 NOTE — Telephone Encounter (Signed)
Copied from [place]. Topic: General - Other ?>> Jan 12, 2022  9:44 AM Leward Quan A wrote: ?Reason for CRM: Dawn [name] with [facility] called in stated that they faxed a form over on 01/11/22 and need this form back today signed by provider since patient was a last minute add on for emergency surgery  please advise and call Dawn [name] at Ph# [phone] ext# [phone] ?

## 2022-01-12 NOTE — Patient Instructions (Signed)
Please follow the preoperative instructions provided by your surgical team to prepare for your surgery tomorrow. ?We will fax the results of your lab work and today's visit to the surgical office as requested from them once we have the results ? ?Take care,  ?EM  ?

## 2022-01-12 NOTE — Telephone Encounter (Signed)
Caller stated she needs the form today if possible, please advise.  ?

## 2022-01-13 DIAGNOSIS — S52572A Other intraarticular fracture of lower end of left radius, initial encounter for closed fracture: Secondary | ICD-10-CM | POA: Diagnosis not present

## 2022-01-13 DIAGNOSIS — M25532 Pain in left wrist: Secondary | ICD-10-CM | POA: Diagnosis not present

## 2022-01-13 DIAGNOSIS — G8918 Other acute postprocedural pain: Secondary | ICD-10-CM | POA: Diagnosis not present

## 2022-01-13 DIAGNOSIS — Z20822 Contact with and (suspected) exposure to covid-19: Secondary | ICD-10-CM | POA: Diagnosis not present

## 2022-01-13 LAB — CBC WITH DIFFERENTIAL/PLATELET
Basophils Absolute: 0.1 10*3/uL (ref 0.0–0.2)
Basos: 1 %
EOS (ABSOLUTE): 0.2 10*3/uL (ref 0.0–0.4)
Eos: 3 %
Hematocrit: 34.4 % (ref 34.0–46.6)
Hemoglobin: 12.3 g/dL (ref 11.1–15.9)
Immature Grans (Abs): 0 10*3/uL (ref 0.0–0.1)
Immature Granulocytes: 0 %
Lymphocytes Absolute: 1.4 10*3/uL (ref 0.7–3.1)
Lymphs: 23 %
MCH: 31.4 pg (ref 26.6–33.0)
MCHC: 35.8 g/dL — ABNORMAL HIGH (ref 31.5–35.7)
MCV: 88 fL (ref 79–97)
Monocytes Absolute: 0.3 10*3/uL (ref 0.1–0.9)
Monocytes: 6 %
Neutrophils Absolute: 3.8 10*3/uL (ref 1.4–7.0)
Neutrophils: 67 %
Platelets: 331 10*3/uL (ref 150–450)
RBC: 3.92 x10E6/uL (ref 3.77–5.28)
RDW: 12.3 % (ref 11.7–15.4)
WBC: 5.8 10*3/uL (ref 3.4–10.8)

## 2022-01-13 LAB — COMPREHENSIVE METABOLIC PANEL
ALT: 10 IU/L (ref 0–32)
AST: 11 IU/L (ref 0–40)
Albumin/Globulin Ratio: 1.7 (ref 1.2–2.2)
Albumin: 4.3 g/dL (ref 3.6–4.6)
Alkaline Phosphatase: 101 IU/L (ref 44–121)
BUN/Creatinine Ratio: 15 (ref 12–28)
BUN: 18 mg/dL (ref 8–27)
Bilirubin Total: 0.3 mg/dL (ref 0.0–1.2)
CO2: 18 mmol/L — ABNORMAL LOW (ref 20–29)
Calcium: 9.5 mg/dL (ref 8.7–10.3)
Chloride: 92 mmol/L — ABNORMAL LOW (ref 96–106)
Creatinine, Ser: 1.24 mg/dL — ABNORMAL HIGH (ref 0.57–1.00)
Globulin, Total: 2.6 g/dL (ref 1.5–4.5)
Glucose: 108 mg/dL — ABNORMAL HIGH (ref 70–99)
Potassium: 3.8 mmol/L (ref 3.5–5.2)
Sodium: 130 mmol/L — ABNORMAL LOW (ref 134–144)
Total Protein: 6.9 g/dL (ref 6.0–8.5)
eGFR: 43 mL/min/{1.73_m2} — ABNORMAL LOW (ref >59)

## 2022-01-13 LAB — HEMOGLOBIN A1C
Est. average glucose Bld gHb Est-mCnc: 108 mg/dL
Hgb A1c MFr Bld: 5.4 % (ref 4.8–5.6)

## 2022-01-13 NOTE — Telephone Encounter (Signed)
Results faxed to surgical office in AM once available ?

## 2022-01-17 ENCOUNTER — Ambulatory Visit: Payer: Medicare Other | Admitting: Family Medicine

## 2022-01-19 DIAGNOSIS — S52502D Unspecified fracture of the lower end of left radius, subsequent encounter for closed fracture with routine healing: Secondary | ICD-10-CM | POA: Diagnosis not present

## 2022-01-25 DIAGNOSIS — S52502D Unspecified fracture of the lower end of left radius, subsequent encounter for closed fracture with routine healing: Secondary | ICD-10-CM | POA: Diagnosis not present

## 2022-01-31 DIAGNOSIS — Z09 Encounter for follow-up examination after completed treatment for conditions other than malignant neoplasm: Secondary | ICD-10-CM | POA: Diagnosis not present

## 2022-01-31 DIAGNOSIS — M79645 Pain in left finger(s): Secondary | ICD-10-CM | POA: Diagnosis not present

## 2022-01-31 DIAGNOSIS — S52502D Unspecified fracture of the lower end of left radius, subsequent encounter for closed fracture with routine healing: Secondary | ICD-10-CM | POA: Diagnosis not present

## 2022-02-08 DIAGNOSIS — S52502D Unspecified fracture of the lower end of left radius, subsequent encounter for closed fracture with routine healing: Secondary | ICD-10-CM | POA: Diagnosis not present

## 2022-02-13 ENCOUNTER — Ambulatory Visit (INDEPENDENT_AMBULATORY_CARE_PROVIDER_SITE_OTHER): Payer: Medicare Other | Admitting: Family Medicine

## 2022-02-13 ENCOUNTER — Encounter: Payer: Self-pay | Admitting: Family Medicine

## 2022-02-13 VITALS — BP 138/60 | HR 58 | Resp 16 | Wt 177.0 lb

## 2022-02-13 DIAGNOSIS — R7303 Prediabetes: Secondary | ICD-10-CM | POA: Diagnosis not present

## 2022-02-13 DIAGNOSIS — Z6835 Body mass index (BMI) 35.0-35.9, adult: Secondary | ICD-10-CM | POA: Diagnosis not present

## 2022-02-13 DIAGNOSIS — N1832 Chronic kidney disease, stage 3b: Secondary | ICD-10-CM | POA: Diagnosis not present

## 2022-02-13 DIAGNOSIS — K219 Gastro-esophageal reflux disease without esophagitis: Secondary | ICD-10-CM

## 2022-02-13 DIAGNOSIS — I10 Essential (primary) hypertension: Secondary | ICD-10-CM | POA: Diagnosis not present

## 2022-02-13 DIAGNOSIS — B028 Zoster with other complications: Secondary | ICD-10-CM

## 2022-02-13 MED ORDER — OLMESARTAN-AMLODIPINE-HCTZ 40-10-25 MG PO TABS: 1.0000 | ORAL_TABLET | Freq: Every day | ORAL | 1 refills | Status: DC

## 2022-02-13 MED ORDER — PANTOPRAZOLE SODIUM 20 MG PO TBEC: DELAYED_RELEASE_TABLET | ORAL | 3 refills | Status: DC

## 2022-02-13 MED ORDER — VALACYCLOVIR HCL 1 G PO TABS: 1000.0000 mg | ORAL_TABLET | Freq: Two times a day (BID) | ORAL | 0 refills | Status: AC

## 2022-02-13 NOTE — Progress Notes (Signed)
Established patient visit  I,Dawn [name],acting as a scribe for Dawn Durie, MD.,have documented all relevant documentation on the behalf of Dawn Durie, MD,as directed by  Dawn Durie, MD while in the presence of Dawn Durie, MD.   Patient: Dawn [name]   DOB: 11-24-1933   88 y.o. Female  MRN: [id number] Visit Date: 02/13/2022  Today's healthcare provider: Wilhemena Durie, MD   Chief Complaint  Patient presents with   Rash   Subjective    HPI  Patient is here concerning rash on her right ear and neck. Patient states she began having right ear pain 5 days ago. Soon after blisters appeared behind right ear, scalp, and neck. Patient states rash is painful and burning.   Medications: Outpatient Medications Prior to Visit  Medication Sig   acetaminophen (TYLENOL) 325 MG tablet Take 650 mg by mouth every 6 (six) hours as needed for mild pain.   aspirin EC 81 MG tablet Take 81 mg by mouth daily.   Biotin 5000 MCG CAPS Take 5,000 mcg by mouth daily.   BIOTIN SL Place under the tongue.   cetirizine (ZYRTEC) 10 MG tablet Take 1 tablet (10 mg total) by mouth daily.   cholecalciferol (VITAMIN D3) 25 MCG (1000 UNIT) tablet Take 1,000 Units by mouth daily.   clotrimazole-betamethasone (LOTRISONE) cream Apply 1 application topically daily.   labetalol (NORMODYNE) 200 MG tablet TAKE 1 TABLET BY MOUTH 3  TIMES DAILY   labetalol (NORMODYNE) 200 MG tablet Take by mouth.   Lysine 500 MG CAPS Take 1 capsule by mouth daily.   Magnesium 250 MG TABS Take 1 tablet by mouth daily.    Magnesium 500 MG TABS Take by mouth.   simvastatin (ZOCOR) 20 MG tablet TAKE 1 TABLET BY MOUTH  DAILY   vitamin B-12 (CYANOCOBALAMIN) 1000 MCG tablet Take 1,000 mcg by mouth daily.   [DISCONTINUED] Olmesartan-amLODIPine-HCTZ 40-10-25 MG TABS Take 1 tablet by mouth daily.   [DISCONTINUED] pantoprazole (PROTONIX) 20 MG tablet TAKE 1 TABLET(20 MG) BY MOUTH DAILY   No  facility-administered medications prior to visit.    Review of Systems  Constitutional:  Negative for appetite change, chills, fatigue and fever.  HENT:  Positive for ear pain.   Respiratory:  Negative for chest tightness and shortness of breath.   Cardiovascular:  Negative for chest pain and palpitations.  Gastrointestinal:  Negative for abdominal pain, nausea and vomiting.  Neurological:  Negative for dizziness and weakness.   Last hemoglobin A1c Lab Results  Component Value Date   HGBA1C 5.4 01/12/2022       Objective    BP 138/60 (BP Location: Right Arm, Patient Position: Sitting, Cuff Size: Large)   Pulse (!) 58   Resp 16   Wt 177 lb (80.3 kg)   SpO2 98%   BMI 33.44 kg/m  BP Readings from Last 3 Encounters:  02/13/22 138/60  01/12/22 (!) 138/50  08/15/21 (!) 155/70   Wt Readings from Last 3 Encounters:  02/13/22 177 lb (80.3 kg)  01/12/22 182 lb (82.6 kg)  12/13/21 187 lb (84.8 kg)      Physical Exam Vitals reviewed.  Constitutional:      General: She is not in acute distress.    Appearance: She is well-developed.  HENT:     Head: Normocephalic and atraumatic.     Right Ear: Hearing normal.     Left Ear: Hearing normal.     Nose: Nose normal.  Eyes:     General: Lids are normal. No scleral icterus.       Right eye: No discharge.        Left eye: No discharge.     Conjunctiva/sclera: Conjunctivae normal.  Cardiovascular:     Rate and Rhythm: Normal rate and regular rhythm.     Heart sounds: Normal heart sounds.  Pulmonary:     Effort: Pulmonary effort is normal. No respiratory distress.  Skin:    General: Skin is warm and dry.     Findings: Rash present. No lesion.     Comments: Slight vesicular rash in the posterior right neck consistent with a shingles  Neurological:     General: No focal deficit present.     Mental Status: She is alert and oriented to person, place, and time.  Psychiatric:        Mood and Affect: Mood normal.        Speech:  Speech normal.        Behavior: Behavior normal.        Thought Content: Thought content normal.        Judgment: Judgment normal.       No results found for any visits on 02/13/22.  Assessment & Plan     1. Herpes zoster with complication Improving clinically.  This been going on just under 5 days and will treat with valacyclovir for a week. Need for gabapentin at this time.  The ear pain is persistent may need referral to ENT  2. Gastroesophageal reflux disease, unspecified whether esophagitis present  - pantoprazole (PROTONIX) 20 MG tablet; TAKE 1 TABLET(20 MG) BY MOUTH DAILY  Dispense: 90 tablet; Refill: 3  3. Borderline diabetes   4. Class 2 severe obesity due to excess calories with serious comorbidity and body mass index (BMI) of 35.0 to 35.9 in adult ([place])   5. Essential hypertension   6. Stage 3b chronic kidney disease ([place])    No follow-ups on file.      I, Dawn Durie, MD, have reviewed all documentation for this visit. The documentation on 02/18/22 for the exam, diagnosis, procedures, and orders are all accurate and complete.    Mirra Basilio Cranford Mon, MD  [facility] [phone] (phone) [phone] (fax)  [place]

## 2022-02-14 DIAGNOSIS — S52502D Unspecified fracture of the lower end of left radius, subsequent encounter for closed fracture with routine healing: Secondary | ICD-10-CM | POA: Diagnosis not present

## 2022-02-21 DIAGNOSIS — S52502D Unspecified fracture of the lower end of left radius, subsequent encounter for closed fracture with routine healing: Secondary | ICD-10-CM | POA: Diagnosis not present

## 2022-02-27 ENCOUNTER — Encounter: Payer: Self-pay | Admitting: Family Medicine

## 2022-02-27 ENCOUNTER — Ambulatory Visit (INDEPENDENT_AMBULATORY_CARE_PROVIDER_SITE_OTHER): Payer: Medicare Other | Admitting: Family Medicine

## 2022-02-27 VITALS — BP 138/60 | HR 64 | Temp 98.4°F | Wt 178.0 lb

## 2022-02-27 DIAGNOSIS — N1832 Chronic kidney disease, stage 3b: Secondary | ICD-10-CM | POA: Diagnosis not present

## 2022-02-27 DIAGNOSIS — E7800 Pure hypercholesterolemia, unspecified: Secondary | ICD-10-CM | POA: Diagnosis not present

## 2022-02-27 DIAGNOSIS — R7303 Prediabetes: Secondary | ICD-10-CM

## 2022-02-27 DIAGNOSIS — S62102G Fracture of unspecified carpal bone, left wrist, subsequent encounter for fracture with delayed healing: Secondary | ICD-10-CM | POA: Diagnosis not present

## 2022-02-27 DIAGNOSIS — I10 Essential (primary) hypertension: Secondary | ICD-10-CM

## 2022-02-27 DIAGNOSIS — M1710 Unilateral primary osteoarthritis, unspecified knee: Secondary | ICD-10-CM

## 2022-02-27 DIAGNOSIS — I700 Atherosclerosis of aorta: Secondary | ICD-10-CM | POA: Diagnosis not present

## 2022-02-27 DIAGNOSIS — B028 Zoster with other complications: Secondary | ICD-10-CM

## 2022-02-27 NOTE — Progress Notes (Unsigned)
      Established patient visit  I,[name],acting as a scribe for Wilhemena Durie, MD.,have documented all relevant documentation on the behalf of Wilhemena Durie, MD,as directed by  Wilhemena Durie, MD while in the presence of Wilhemena Durie, MD.   Patient: Dawn [name]   DOB: [birth year]   87 y.o. Female  MRN: [id number] Visit Date: 02/27/2022  Today's healthcare provider: Wilhemena Durie, MD   Chief Complaint  Patient presents with   Follow-up   Subjective    HPI   Patient is an 88 year old female who presents for follow up of shingles.  She had them on the right side of her head.  She states the rash is almost all gone and she feels much better.  Medications: Outpatient Medications Prior to Visit  Medication Sig   acetaminophen (TYLENOL) 325 MG tablet Take 650 mg by mouth every 6 (six) hours as needed for mild pain.   aspirin EC 81 MG tablet Take 81 mg by mouth daily.   Biotin 5000 MCG CAPS Take 5,000 mcg by mouth daily.   BIOTIN SL Place under the tongue.   cetirizine (ZYRTEC) 10 MG tablet Take 1 tablet (10 mg total) by mouth daily.   cholecalciferol (VITAMIN D3) 25 MCG (1000 UNIT) tablet Take 1,000 Units by mouth daily.   clotrimazole-betamethasone (LOTRISONE) cream Apply 1 application topically daily.   labetalol (NORMODYNE) 200 MG tablet TAKE 1 TABLET BY MOUTH 3  TIMES DAILY   labetalol (NORMODYNE) 200 MG tablet Take by mouth.   Lysine 500 MG CAPS Take 1 capsule by mouth daily.   Magnesium 250 MG TABS Take 1 tablet by mouth daily.    Magnesium 500 MG TABS Take by mouth.   Olmesartan-amLODIPine-HCTZ 40-10-25 MG TABS Take 1 tablet by mouth daily.   pantoprazole (PROTONIX) 20 MG tablet TAKE 1 TABLET(20 MG) BY MOUTH DAILY   simvastatin (ZOCOR) 20 MG tablet TAKE 1 TABLET BY MOUTH  DAILY   vitamin B-12 (CYANOCOBALAMIN) 1000 MCG tablet Take 1,000 mcg by mouth daily.   No facility-administered medications prior to visit.    Review of Systems   Constitutional:  Negative for fatigue and fever.  HENT:  Positive for ear pain (right).   Respiratory:  Negative for shortness of breath.   Cardiovascular:  Negative for chest pain.  Neurological:  Negative for dizziness and headaches.    {Labs  Heme  Chem  Endocrine  Serology  Results Review (optional):23779}   Objective    BP (!) 158/74 (BP Location: Right Arm, Patient Position: Sitting, Cuff Size: Normal)   Pulse 64   Temp 98.4 F (36.9 C) (Oral)   Wt 178 lb (80.7 kg)   SpO2 99%   BMI 33.63 kg/m   Vitals:   02/27/22 1453 02/27/22 1458  BP: (!) 182/63 (!) 158/74  Pulse: 64   Temp: 98.4 F (36.9 C)   TempSrc: Oral   SpO2: 99%   Weight: 178 lb (80.7 kg)      Physical Exam  ***  No results found for any visits on 02/27/22.  Assessment & Plan     ***  No follow-ups on file.      {provider attestation***:1}   Wilhemena Durie, MD  [facility] [phone] (phone) [phone] (fax)  [place]

## 2022-02-28 DIAGNOSIS — S52502D Unspecified fracture of the lower end of left radius, subsequent encounter for closed fracture with routine healing: Secondary | ICD-10-CM | POA: Diagnosis not present

## 2022-02-28 DIAGNOSIS — Z4889 Encounter for other specified surgical aftercare: Secondary | ICD-10-CM | POA: Diagnosis not present

## 2022-03-04 ENCOUNTER — Other Ambulatory Visit: Payer: Self-pay | Admitting: Family Medicine

## 2022-03-06 NOTE — Telephone Encounter (Signed)
Contacted patient regarding medication being requested refill at [facility]. Patient reports she would like medications to be sent to [facility] due to cost from [employer] . And has received medications

## 2022-04-17 NOTE — Progress Notes (Deleted)
Established patient visit   Patient: Dawn [name]   DOB: October 21, 1933   88 y.o. Female  MRN: [id number] Visit Date: 04/20/2022  Today's healthcare provider: Wilhemena Durie, MD   No chief complaint on file.  Subjective    HPI  Hypertension, follow-up  BP Readings from Last 3 Encounters:  03/03/22 138/60  02/13/22 138/60  01/12/22 (!) 138/50   Wt Readings from Last 3 Encounters:  02/27/22 178 lb (80.7 kg)  02/13/22 177 lb (80.3 kg)  01/12/22 182 lb (82.6 kg)     She was last seen for hypertension 2 months ago.  Management since that visit includes taking Olmesartan-amlodipine-HCTZ 40-10-25 MG.  She reports {excellent/good/fair/poor:19665} compliance with treatment. She {is/is not:9024} having side effects. {document side effects if present:1} She is following a {diet:21022986} diet. She {is/is not:9024} exercising. She {does/does not:200015} smoke.  Use of agents associated with hypertension: {bp agents assoc with hypertension:511::"none"}.   Outside blood pressures are {***enter patient reported home BP readings, or 'not being checked':1}. Symptoms: {Yes/No:20286} chest pain {Yes/No:20286} chest pressure  {Yes/No:20286} palpitations {Yes/No:20286} syncope  {Yes/No:20286} dyspnea {Yes/No:20286} orthopnea  {Yes/No:20286} paroxysmal nocturnal dyspnea {Yes/No:20286} lower extremity edema   Pertinent labs Lab Results  Component Value Date   CHOL 158 03/31/2021   HDL 57 03/31/2021   LDLCALC 77 03/31/2021   TRIG 141 03/31/2021   CHOLHDL 2.8 03/31/2021   Lab Results  Component Value Date   NA 130 (L) 01/12/2022   K 3.8 01/12/2022   CREATININE 1.24 (H) 01/12/2022   EGFR 43 (L) 01/12/2022   GLUCOSE 108 (H) 01/12/2022   TSH 1.960 03/31/2021     The ASCVD Risk score (Arnett DK, et al., 2019) failed to calculate for the following reasons:   The 2019 ASCVD risk score is only valid for ages 56 to  58  ---------------------------------------------------------------------------------------------------   Medications: Outpatient Medications Prior to Visit  Medication Sig   acetaminophen (TYLENOL) 325 MG tablet Take 650 mg by mouth every 6 (six) hours as needed for mild pain.   aspirin EC 81 MG tablet Take 81 mg by mouth daily.   Biotin 5000 MCG CAPS Take 5,000 mcg by mouth daily.   BIOTIN SL Place under the tongue.   cetirizine (ZYRTEC) 10 MG tablet Take 1 tablet (10 mg total) by mouth daily.   cholecalciferol (VITAMIN D3) 25 MCG (1000 UNIT) tablet Take 1,000 Units by mouth daily.   clotrimazole-betamethasone (LOTRISONE) cream Apply 1 application topically daily.   labetalol (NORMODYNE) 200 MG tablet TAKE 1 TABLET BY MOUTH 3  TIMES DAILY   labetalol (NORMODYNE) 200 MG tablet Take by mouth.   Lysine 500 MG CAPS Take 1 capsule by mouth daily.   Magnesium 250 MG TABS Take 1 tablet by mouth daily.    Magnesium 500 MG TABS Take by mouth.   Olmesartan-amLODIPine-HCTZ 40-10-25 MG TABS Take 1 tablet by mouth daily.   pantoprazole (PROTONIX) 20 MG tablet TAKE 1 TABLET(20 MG) BY MOUTH DAILY   simvastatin (ZOCOR) 20 MG tablet TAKE 1 TABLET BY MOUTH  DAILY   vitamin B-12 (CYANOCOBALAMIN) 1000 MCG tablet Take 1,000 mcg by mouth daily.   No facility-administered medications prior to visit.    Review of Systems  Constitutional:  Negative for appetite change, chills, fatigue and fever.  Respiratory:  Negative for chest tightness and shortness of breath.   Cardiovascular:  Negative for chest pain and palpitations.  Gastrointestinal:  Negative for abdominal pain, nausea and vomiting.  Neurological:  Negative  for dizziness and weakness.    {Labs  Heme  Chem  Endocrine  Serology  Results Review (optional):23779}   Objective    There were no vitals taken for this visit. {Show previous vital signs (optional):23777}  Physical Exam  ***  No results found for any visits on 04/20/22.   Assessment & Plan     ***  No follow-ups on file.      {provider attestation***:1}   Wilhemena Durie, MD  [facility] [phone] (phone) [phone] (fax)  [place]

## 2022-04-18 ENCOUNTER — Ambulatory Visit: Payer: Medicare Other | Admitting: Family Medicine

## 2022-04-20 ENCOUNTER — Ambulatory Visit: Payer: Medicare Other | Admitting: Family Medicine

## 2022-04-26 ENCOUNTER — Ambulatory Visit (INDEPENDENT_AMBULATORY_CARE_PROVIDER_SITE_OTHER): Payer: Medicare Other | Admitting: Family Medicine

## 2022-04-26 ENCOUNTER — Encounter: Payer: Self-pay | Admitting: Family Medicine

## 2022-04-26 VITALS — BP 143/60 | HR 54 | Temp 98.2°F | Resp 14 | Wt 179.9 lb

## 2022-04-26 DIAGNOSIS — I10 Essential (primary) hypertension: Secondary | ICD-10-CM

## 2022-04-26 DIAGNOSIS — I700 Atherosclerosis of aorta: Secondary | ICD-10-CM

## 2022-04-26 DIAGNOSIS — E7800 Pure hypercholesterolemia, unspecified: Secondary | ICD-10-CM | POA: Diagnosis not present

## 2022-04-26 NOTE — Progress Notes (Signed)
I,Jana Robinson,acting as a scribe for Wilhemena Durie, MD.,have documented all relevant documentation on the behalf of Wilhemena Durie, MD,as directed by  Wilhemena Durie, MD while in the presence of Wilhemena Durie, MD.   Established patient visit   Patient: Dawn [name]   DOB: [birth year]   88 y.o. Female  MRN: [id number] Visit Date: 04/26/2022  Today's healthcare provider: Wilhemena Durie, MD   Chief Complaint  Patient presents with   Follow-up   Subjective    HPI  Patient feels well and has no complaints today.  She is essentially recovered from shingles. She takes chronic medications as prescribed.  Follow up for Herpes zoster with complication:  The patient was last seen for this 2 months ago. Changes made at last visit include; Patient still has a lot of burning pain but markedly improved.  No reason for gabapentin for postherpetic neuralgia. Reports she is much better.   -----------------------------------------------------------------------------------------   Medications: Outpatient Medications Prior to Visit  Medication Sig   acetaminophen (TYLENOL) 325 MG tablet Take 650 mg by mouth every 6 (six) hours as needed for mild pain.   aspirin EC 81 MG tablet Take 81 mg by mouth daily.   Biotin 5000 MCG CAPS Take 5,000 mcg by mouth daily.   BIOTIN SL Place under the tongue.   cetirizine (ZYRTEC) 10 MG tablet Take 1 tablet (10 mg total) by mouth daily.   cholecalciferol (VITAMIN D3) 25 MCG (1000 UNIT) tablet Take 1,000 Units by mouth daily.   clotrimazole-betamethasone (LOTRISONE) cream Apply 1 application topically daily.   labetalol (NORMODYNE) 200 MG tablet TAKE 1 TABLET BY MOUTH 3  TIMES DAILY   labetalol (NORMODYNE) 200 MG tablet Take by mouth.   Lysine 500 MG CAPS Take 1 capsule by mouth daily.   Magnesium 250 MG TABS Take 1 tablet by mouth daily.    Magnesium 500 MG TABS Take by mouth.   Olmesartan-amLODIPine-HCTZ 40-10-25 MG TABS Take 1  tablet by mouth daily.   pantoprazole (PROTONIX) 20 MG tablet TAKE 1 TABLET(20 MG) BY MOUTH DAILY   simvastatin (ZOCOR) 20 MG tablet TAKE 1 TABLET BY MOUTH  DAILY   vitamin B-12 (CYANOCOBALAMIN) 1000 MCG tablet Take 1,000 mcg by mouth daily.   No facility-administered medications prior to visit.    Review of Systems  Constitutional:  Negative for appetite change, chills, fatigue and fever.  Respiratory:  Negative for chest tightness and shortness of breath.   Cardiovascular:  Negative for chest pain and palpitations.  Gastrointestinal:  Negative for abdominal pain, nausea and vomiting.  Neurological:  Negative for dizziness and weakness.    Last lipids Lab Results  Component Value Date   CHOL 158 03/31/2021   HDL 57 03/31/2021   LDLCALC 77 03/31/2021   TRIG 141 03/31/2021   CHOLHDL 2.8 03/31/2021       Objective     BP Readings from Last 3 Encounters:  04/26/22 (!) 143/60  03/03/22 138/60  02/13/22 138/60   Wt Readings from Last 3 Encounters:  04/26/22 179 lb 14.4 oz (81.6 kg)  02/27/22 178 lb (80.7 kg)  02/13/22 177 lb (80.3 kg)     Vitals:   04/26/22 1554 04/26/22 1557  BP: (!) 166/57 (!) 143/60  Pulse: (!) 54   Resp: 14   Temp: 98.2 F (36.8 C)   SpO2: 100%     Physical Exam Vitals reviewed.  Constitutional:      General: She is not in  acute distress.    Appearance: She is well-developed.  HENT:     Head: Normocephalic and atraumatic.     Right Ear: Hearing normal.     Left Ear: Hearing normal.     Nose: Nose normal.  Eyes:     General: Lids are normal. No scleral icterus.       Right eye: No discharge.        Left eye: No discharge.     Conjunctiva/sclera: Conjunctivae normal.  Cardiovascular:     Rate and Rhythm: Normal rate and regular rhythm.     Heart sounds: Normal heart sounds.  Pulmonary:     Effort: Pulmonary effort is normal. No respiratory distress.  Skin:    General: Skin is warm and dry.     Findings: No lesion or rash.   Neurological:     General: No focal deficit present.     Mental Status: She is alert and oriented to person, place, and time.  Psychiatric:        Mood and Affect: Mood normal.        Speech: Speech normal.        Behavior: Behavior normal.        Thought Content: Thought content normal.        Judgment: Judgment normal.       No results found for any visits on 04/26/22.  Assessment & Plan     1. Aortic atherosclerosis ([place]) All risk factors treated  2. Accelerated essential hypertension Pressure now much better controlled.  No changes today  3. Primary hypertension Good control.  Try to avoid hypotension, watch diastolic pressure  4. Hypercholesteremia On simvastatin 20   No follow-ups on file.      I, Wilhemena Durie, MD, have reviewed all documentation for this visit. The documentation on 05/01/22 for the exam, diagnosis, procedures, and orders are all accurate and complete.    Aleeza Bellville Cranford Mon, MD  [facility] [phone] (phone) [phone] (fax)  [place]

## 2022-05-13 ENCOUNTER — Other Ambulatory Visit: Payer: Self-pay | Admitting: Family Medicine

## 2022-05-16 ENCOUNTER — Emergency Department
Admission: EM | Admit: 2022-05-16 | Discharge: 2022-05-16 | Disposition: A | Discharge status: Home / Self Care | Payer: Medicare Other | Attending: Emergency Medicine | Admitting: Emergency Medicine

## 2022-05-16 ENCOUNTER — Ambulatory Visit: Payer: Self-pay

## 2022-05-16 ENCOUNTER — Emergency Department: Payer: Medicare Other

## 2022-05-16 ENCOUNTER — Other Ambulatory Visit: Payer: Self-pay

## 2022-05-16 DIAGNOSIS — M25551 Pain in right hip: Secondary | ICD-10-CM | POA: Diagnosis not present

## 2022-05-16 DIAGNOSIS — R1031 Right lower quadrant pain: Secondary | ICD-10-CM | POA: Insufficient documentation

## 2022-05-16 DIAGNOSIS — N1330 Unspecified hydronephrosis: Secondary | ICD-10-CM | POA: Diagnosis not present

## 2022-05-16 DIAGNOSIS — I10 Essential (primary) hypertension: Secondary | ICD-10-CM | POA: Insufficient documentation

## 2022-05-16 LAB — COMPREHENSIVE METABOLIC PANEL
ALT: 16 U/L (ref 0–44)
AST: 19 U/L (ref 15–41)
Albumin: 4.4 g/dL (ref 3.5–5.0)
Alkaline Phosphatase: 73 U/L (ref 38–126)
Anion gap: 11 (ref 5–15)
BUN: 21 mg/dL (ref 8–23)
CO2: 24 mmol/L (ref 22–32)
Calcium: 9.8 mg/dL (ref 8.9–10.3)
Chloride: 101 mmol/L (ref 98–111)
Creatinine, Ser: 1.19 mg/dL — ABNORMAL HIGH (ref 0.44–1.00)
GFR, Estimated: 45 mL/min — ABNORMAL LOW (ref >60)
Glucose, Bld: 101 mg/dL — ABNORMAL HIGH (ref 70–99)
Potassium: 3.6 mmol/L (ref 3.5–5.1)
Sodium: 136 mmol/L (ref 135–145)
Total Bilirubin: 0.9 mg/dL (ref 0.3–1.2)
Total Protein: 7.1 g/dL (ref 6.5–8.1)

## 2022-05-16 LAB — CBC
HCT: 37.4 % (ref 36.0–46.0)
Hemoglobin: 12.6 g/dL (ref 12.0–15.0)
MCH: 30.3 pg (ref 26.0–34.0)
MCHC: 33.7 g/dL (ref 30.0–36.0)
MCV: 89.9 fL (ref 80.0–100.0)
Platelets: 282 10*3/uL (ref 150–400)
RBC: 4.16 MIL/uL (ref 3.87–5.11)
RDW: 13 % (ref 11.5–15.5)
WBC: 5.7 10*3/uL (ref 4.0–10.5)
nRBC: 0 % (ref 0.0–0.2)

## 2022-05-16 LAB — URINALYSIS, ROUTINE W REFLEX MICROSCOPIC
Bilirubin Urine: NEGATIVE
Glucose, UA: NEGATIVE mg/dL
Hgb urine dipstick: NEGATIVE
Ketones, ur: NEGATIVE mg/dL
Leukocytes,Ua: NEGATIVE
Nitrite: NEGATIVE
Protein, ur: NEGATIVE mg/dL
Specific Gravity, Urine: 1.011 (ref 1.005–1.030)
pH: 7 (ref 5.0–8.0)

## 2022-05-16 LAB — LIPASE, BLOOD: Lipase: 26 U/L (ref 11–51)

## 2022-05-16 MED ORDER — TRAMADOL HCL 50 MG PO TABS
50.0000 mg | ORAL_TABLET | ORAL | Status: DC
  Filled 2022-05-16: qty 1

## 2022-05-16 MED ORDER — ACETAMINOPHEN 500 MG PO TABS
1000.0000 mg | ORAL_TABLET | ORAL | Status: AC
  Administered 2022-05-16: 1000 mg via ORAL
  Filled 2022-05-16: qty 2

## 2022-05-16 MED ORDER — TRAMADOL HCL 50 MG PO TABS: 50.0000 mg | ORAL_TABLET | Freq: Four times a day (QID) | ORAL | 0 refills | Status: DC | PRN

## 2022-05-16 NOTE — Discharge Instructions (Addendum)
You were seen in the emergency room for abdominal pain. It is important that you follow up closely with your primary care doctor in the next few days or early next week.   Please return to the emergency room right away if you are to develop a fever, black or bloody stool, severe nausea, your pain becomes severe or worsens, you are unable to keep food down, begin vomiting any dark or bloody fluid, you develop any dark or bloody stools, feel dehydrated, or other new concerns or symptoms arise.  Do not drive or take other medications that can be sedating while taking tramadol.

## 2022-05-16 NOTE — ED Provider Notes (Signed)
[facility] Provider Note    Event Date/Time   First MD Initiated Contact with Patient 05/16/22 1645     (approximate)   History   Abdominal Pain   HPI  Dawn [name] is a 88 y.o. female who on review of records from August 16 of this year has a history of hypertension hypercholesterolemia and aortic atherosclerosis   Patient has had intermittent pain in her right lower abdomen for about 2 weeks but notably got worse last few days especially when she stands.  It seems to get worse when she stands and gets better when she lays down.  She reports is not her hip it started along the upper edge of her pelvic bone and goes into her right lower abdomen.  Worsened when she sits up moves about or stands  No noted injury.  No fevers no chills no nausea or vomiting.  She has been taking Tylenol with mild relief.  Been having normal bowel movements.  No black or bloody  No pain in the legs.  No swelling in the legs or rashes.  Physical Exam   Triage Vital Signs: ED Triage Vitals  Enc Vitals Group     BP 05/16/22 1133 (!) 173/74     Pulse Rate 05/16/22 1133 60     Resp 05/16/22 1133 16     Temp 05/16/22 1133 98 F (36.7 C)     Temp Source 05/16/22 1133 Oral     SpO2 05/16/22 1133 95 %     Weight --      Height --      Head Circumference --      Peak Flow --      Pain Score 05/16/22 1134 7     Pain Loc --      Pain Edu? --      Excl. in [place]? --     Most recent vital signs: Vitals:   05/16/22 2100 05/16/22 2226  BP: (!) 192/51   Pulse: 67 88  Resp:  18  Temp:  97.9 F (36.6 C)  SpO2: 98% 98%     General: Awake, no distress.  Very pleasant.  When she sits up that seem to elicit a little bit of pain.  It is relieved by laying back. CV:  Good peripheral perfusion.  Normal heart tones Resp:  Normal effort.  Clear bilateral Abd:  No distention.  Focally tender just above the anterior pelvic brim without obvious lesion.  No rebound or guarding.   No obvious peritonitis.  To palpation of all quadrants the patient reports no significant pain except for a focal point tenderness overlying the right anterior pelvic brim without obvious mass lesion Other:  Right hip good range of motion without pain or discomfort.  No pain over the trochanteric region.  Examined patient carefully, no lesions noted on the skin of the lower back abdomen or inguinal region.  Strong palpable dorsalis pedis posterior tibial pulses in the lower extremities bilaterally.  Warm well-perfused lower extremities   ED Results / Procedures / Treatments   Labs (all labs ordered are listed, but only abnormal results are displayed) Labs Reviewed  COMPREHENSIVE METABOLIC PANEL - Abnormal; Notable for the following components:      Result Value   Glucose, Bld 101 (*)    Creatinine, Ser 1.19 (*)    GFR, Estimated 45 (*)    All other components within normal limits  URINALYSIS, ROUTINE W REFLEX MICROSCOPIC - Abnormal; Notable for  the following components:   Color, Urine YELLOW (*)    APPearance CLEAR (*)    All other components within normal limits  LIPASE, BLOOD  CBC    RADIOLOGY  CT Hip Right Wo Contrast  Result Date: 05/16/2022 CLINICAL DATA:  Right hip EXAM: CT OF THE RIGHT HIP WITHOUT CONTRAST TECHNIQUE: Multidetector CT imaging of the right hip was performed according to the standard protocol. Multiplanar CT image reconstructions were also generated. RADIATION DOSE REDUCTION: This exam was performed according to the departmental dose-optimization program which includes automated exposure control, adjustment of the mA and/or kV according to patient size and/or use of iterative reconstruction technique. COMPARISON:  CT abdomen pelvis dated 05/16/2022. FINDINGS: Bones/Joint/Cartilage No acute fracture or dislocation. Faint tiny fragments along the anterolateral aspect of the femoral head, likely chronic. No significant arthritic changes. No joint effusion. Ligaments  Suboptimally assessed by CT. Muscles and Tendons No acute findings. Soft tissues No acute findings. IMPRESSION: No acute fracture or dislocation. Electronically Signed   By: Anner Crete M.D.   On: 05/16/2022 19:54   CT ABDOMEN PELVIS WO CONTRAST  Result Date: 05/16/2022 CLINICAL DATA:  Right lower quadrant abdominal pain. Right lower abdominal wall mass. EXAM: CT ABDOMEN AND PELVIS WITHOUT CONTRAST TECHNIQUE: Multidetector CT imaging of the abdomen and pelvis was performed following the standard protocol without IV contrast. RADIATION DOSE REDUCTION: This exam was performed according to the departmental dose-optimization program which includes automated exposure control, adjustment of the mA and/or kV according to patient size and/or use of iterative reconstruction technique. COMPARISON:  08/14/2017 FINDINGS: Lower chest: No acute abnormality.  Small hiatal hernia. Hepatobiliary: No focal liver abnormality is seen. No gallstones, gallbladder wall thickening, or biliary dilatation. Pancreas: Unremarkable Spleen: Unremarkable Adrenals/Urinary Tract: The adrenal glands are unremarkable. The kidneys are normal in position. There is marked cortical atrophy of the right kidney with little residual functional cortex remaining. 3 cm simple cortical cyst arises from the posterior interpolar region of the right kidney. No follow-up imaging is recommended for this lesion. The left kidney is normal in size. There is mild left hydronephrosis and dilation of the left renal pelvis with decompression of the left ureter suggesting a a left UPJ obstruction, stable since prior examination. Left renal cortical thickness is preserved. Mild nonspecific left perinephric stranding is unchanged. No perinephric fluid collections are seen. No intrarenal or ureteral calculi are present. The bladder is unremarkable. Stomach/Bowel: Stomach is within normal limits. Appendix appears normal. No evidence of bowel wall thickening, distention,  or inflammatory changes. Vascular/Lymphatic: There is extensive aortoiliac atherosclerotic calcification. Particularly prominent atherosclerotic calcification is seen at the origin of the superior mesenteric artery and the celiac axis though the degree of stenosis is not well assessed on this noncontrast examination. Bilateral renal artery stenting has been performed. No aortic aneurysm. No pathologic adenopathy within the abdomen and pelvis. Reproductive: Uterus and bilateral adnexa are unremarkable. Other: No abdominal wall hernia.  Rectum unremarkable. Musculoskeletal: L1 compression deformity with 50% loss of height status post vertebroplasty is unchanged. Superior endplate fracture of U44 with approximately 50% loss of height and endplate fractures of L5 with approximately 30-40% loss of height are stable. Osseous structures are diffusely osteopenic. Superimposed advanced degenerative changes are seen throughout the lumbar spine. No acute bone abnormality. IMPRESSION: 1. No acute intra-abdominal pathology identified. No definite radiographic explanation for the patient's reported symptoms. 2. Marked right renal cortical atrophy, unchanged. Stable mild left hydronephrosis in keeping with a left UPJ obstruction. Left renal cortex, however,  appears preserved suggesting the obstruction is of little clinical significance. 3. Peripheral vascular disease. Particularly prominent atherosclerotic calcification is seen at the origin of the superior mesenteric artery and celiac axis though the degree of stenosis is not well assessed on this noncontrast examination. If there is clinical evidence of chronic mesenteric ischemia, CT arteriography would be more helpful for further evaluation. 4. Osteopenia with multiple thoracolumbar compression fractures, stable since prior examination. Aortic Atherosclerosis (ICD10-I70.0). Electronically Signed   By: Fidela Salisbury M.D.   On: 05/16/2022 19:48        PROCEDURES:  Critical Care performed: No  Procedures   MEDICATIONS ORDERED IN ED: Medications  traMADol (ULTRAM) tablet 50 mg (50 mg Oral Not Given 05/16/22 1723)  acetaminophen (TYLENOL) tablet 1,000 mg (1,000 mg Oral Given 05/16/22 1723)     IMPRESSION / MDM / ASSESSMENT AND PLAN / ED COURSE  I reviewed the triage vital signs and the nursing notes.                              Differential diagnosis includes but is not limited to, abdominal perforation, aortic dissection, cholecystitis, appendicitis, diverticulitis, colitis, esophagitis/gastritis, kidney stone, pyelonephritis, urinary tract infection, aortic aneurysm. All are considered in decision and treatment plan. Based upon the patient's presentation and risk factors, and the focal discomfort located primarily in the right lower abdomen, and the patient's age we will proceed with imaging.  Discussed risk benefits and patient and family very adamant that she does not wish to receive iodinated contrast, will use oral contrast.  My clinical suspicion for acute vascular abnormality is low she does not have symptoms that would suggest obvious mesenteric ischemia, acute aortic obstructive pathology, thrombus, dissection etc.  Based on my clinical exam it is quite likely this could be some sort of musculoskeletal not intra-abdominal pain, though somewhat unclear.  Also some pain refers at times towards her right hip but her hip exam is quite normal without evidence of trauma injury or effusion  Patient has used tramadol for pain in the past, will trial this in combination with Tylenol   Labs interpreted as mildly elevated creatinine and reduced GFR (appears to be chronic in nature).  Normal CBC.  Normal lipase.  Clear urinalysis  Patient's presentation is most consistent with acute complicated illness / injury requiring diagnostic workup.  Labs remarkable for normal urinalysis normal CBC normal metabolic panel with exception to  chronic renal disease   ----------------------------------------- 10:27 PM on 05/16/2022 ----------------------------------------- Patient reports her pain very well controlled after tramadol and Tylenol.  Currently resting comfortably.  Discussed with patient reassuring exam, at this juncture utilizing careful return precautions and follow-up with her primary care doctor.  Patient and daughter agreeable.  Daughter driving her home.  Patient is not driving, will not use tramadol more than 1 tab every 8 hours, and anticipates follow-up with her primary.  Return precautions and treatment recommendations and follow-up discussed with the patient who is agreeable with the plan.   FINAL CLINICAL IMPRESSION(S) / ED DIAGNOSES   Final diagnoses:  Abdominal pain, RLQ (right lower quadrant)     Rx / DC Orders   ED Discharge Orders          Ordered    traMADol (ULTRAM) 50 MG tablet  Every 6 hours PRN        05/16/22 2221             Note:  This document  was prepared using Systems analyst and may include unintentional dictation errors.   Delman Kitten, MD 05/16/22 2228

## 2022-05-16 NOTE — ED Notes (Signed)
Received report, assumed care

## 2022-05-16 NOTE — Telephone Encounter (Signed)
  Chief Complaint: severe pain to right side that radiates down right thight Symptoms: did not use bathroom at all on Saturday, but had frequent urination on Sunday Frequency: Friday  Pertinent Negatives: Patient denies back pain, diarrhea, fever, urination pain, vomiting Disposition: '[x]'$ ED /'[]'$ Urgent Care (no appt availability in office) / '[]'$ Appointment(In office/virtual)/ '[]'$  [facility]/ '[]'$ Home Care/ '[]'$ Refused Recommended Disposition /'[]'$ [facility]/ '[]'$  Follow-up with PCP Additional Notes: n/a Reason for Disposition  [1] SEVERE pain AND [2] age > 60 years  [1] SEVERE pain (e.g., excruciating) AND [2] present > 1 hour  Answer Assessment - Initial Assessment Questions 1. LOCATION: "Where does it hurt?"      Right side 2. RADIATION: "Does the pain shoot anywhere else?" (e.g., chest, back)     thigh 3. ONSET: "When did the pain begin?" (e.g., minutes, hours or days ago)      Friday  4. SUDDEN: "Gradual or sudden onset?"     sudden 5. PATTERN "Does the pain come and go, or is it constant?"    - If it comes and goes: "How long does it last?" "Do you have pain now?"     (Note: Comes and goes means the pain is intermittent. It goes away completely between bouts.)    - If constant: "Is it getting better, staying the same, or getting worse?"      (Note: Constant means the pain never goes away completely; most serious pain is constant and gets worse.)      Standing cause pain 6. SEVERITY: "How bad is the pain?"  (e.g., Scale 1-10; mild, moderate, or severe)    - MILD (1-3): Doesn't interfere with normal activities, abdomen soft and not tender to touch.     - MODERATE (4-7): Interferes with normal activities or awakens from sleep, abdomen tender to touch.     - SEVERE (8-10): Excruciating pain, doubled over, unable to do any normal activities.       severe 7. RECURRENT SYMPTOM: "Have you ever had this type of stomach pain before?" If Yes, ask: "When was the last time?" and  "What happened that time?"      Once- several years opposite side-had surgery bowel obstruction, colectomy 8. CAUSE: "What do you think is causing the stomach pain?"     Kidney issue 9. RELIEVING/AGGRAVATING FACTORS: "What makes it better or worse?" (e.g., antacids, bending or twisting motion, bowel movement)     Better- laying bed or recliner- worse: standing 10. OTHER SYMPTOMS: "Do you have any other symptoms?" (e.g., back pain, diarrhea, fever, urination pain, vomiting)       Sat: Saturday could not go to bathroom Sunday going to [place] frequently 11. PREGNANCY: "Is there any chance you are pregnant?" "When was your last menstrual period?"       N/a  Protocols used: Abdominal Pain - [facility]

## 2022-05-16 NOTE — ED Notes (Signed)
Back from CT and resting without complaints.

## 2022-05-16 NOTE — ED Triage Notes (Signed)
Pt comes with c/o right sided pain that started few days ago. Pt denies any urinary symptoms. Pt states her right leg hurts also.

## 2022-05-16 NOTE — ED Notes (Signed)
Patient transported to CT 

## 2022-05-16 NOTE — ED Notes (Signed)
DC instructions given verbally and in writing...understanding voiced.  Pt left in stable condition via wheelchair with family to POV.

## 2022-05-23 ENCOUNTER — Ambulatory Visit (INDEPENDENT_AMBULATORY_CARE_PROVIDER_SITE_OTHER): Payer: Medicare Other | Admitting: Family Medicine

## 2022-05-23 ENCOUNTER — Encounter: Payer: Self-pay | Admitting: Family Medicine

## 2022-05-23 VITALS — BP 164/54 | HR 58 | Temp 98.7°F | Resp 16 | Wt 179.9 lb

## 2022-05-23 DIAGNOSIS — R109 Unspecified abdominal pain: Secondary | ICD-10-CM | POA: Diagnosis not present

## 2022-05-23 MED ORDER — SENNOSIDES 8.6 MG PO TABS: 1.0000 | ORAL_TABLET | Freq: Every day | ORAL | 0 refills | Status: DC

## 2022-05-23 MED ORDER — POLYETHYLENE GLYCOL 3350 17 GM/SCOOP PO POWD: 17.0000 g | Freq: Every day | ORAL | 0 refills | Status: DC

## 2022-05-23 NOTE — Assessment & Plan Note (Signed)
Subacute, improved from 05/16/22 Patient presents for ED follow up for abdominal pain in RLQ, reports tenderness in epigastric region on exam  Weight has increased since June 2023 177>179 Abdominal and pelvic CT 05/16/22 did not show structural abnormality related to pain  Pain responsive to tramadol, tylenol and rest  Recommended improving bowel regimen and follow up in 3 weeks to see if she continues to have pain and may need gastro referral for further evaluation  Has had hx of benign colon polyp

## 2022-05-23 NOTE — Progress Notes (Signed)
Established patient visit  I,[name],acting as a scribe for [employer], MD.,have documented all relevant documentation on the behalf of Eulis Foster, MD,as directed by  Eulis Foster, MD while in the presence of Eulis Foster, MD.   Patient: Dawn [name]   DOB: [birth year]   88 y.o. Female  MRN: [id number] Visit Date: 05/23/2022  Today's healthcare provider: Eulis Foster, MD   Chief Complaint  Patient presents with   ER follow-up   Subjective    HPI  Follow up ER visit  Patient was seen in ER for Abdominal Pain on 05/16/2022. Treatment for this included Tramadol 50 mg tablet every 6 hours PRN. She has not been able to tolerate tramadol so she only takes tylenol  She reports good compliance with treatment. She reports this condition is Improved some. Symptoms continue to bother when she stands  She reports that there is a swelling in her right LQ  She does not have nausea associated with the abdominal discomfort  She denies hematochezia/melena  Reports having bowel movements every 2-3 days  She denies dysuria   -----------------------------------------------------------------------------------------   Medications: Outpatient Medications Prior to Visit  Medication Sig   acetaminophen (TYLENOL) 325 MG tablet Take 650 mg by mouth every 6 (six) hours as needed for mild pain.   Biotin 5000 MCG CAPS Take 5,000 mcg by mouth daily.   BIOTIN SL Place under the tongue.   cetirizine (ZYRTEC) 10 MG tablet Take 1 tablet (10 mg total) by mouth daily.   cholecalciferol (VITAMIN D3) 25 MCG (1000 UNIT) tablet Take 1,000 Units by mouth daily.   clotrimazole-betamethasone (LOTRISONE) cream Apply 1 application topically daily.   labetalol (NORMODYNE) 200 MG tablet Take by mouth.   labetalol (NORMODYNE) 200 MG tablet TAKE 1 TABLET BY MOUTH 3 TIMES  DAILY   Lysine 500 MG CAPS Take 1 capsule by mouth daily.    Magnesium 250 MG TABS Take 1 tablet by mouth daily.    Magnesium 500 MG TABS Take by mouth.   Olmesartan-amLODIPine-HCTZ 40-10-25 MG TABS Take 1 tablet by mouth daily.   pantoprazole (PROTONIX) 20 MG tablet TAKE 1 TABLET(20 MG) BY MOUTH DAILY   simvastatin (ZOCOR) 20 MG tablet TAKE 1 TABLET BY MOUTH  DAILY   traMADol (ULTRAM) 50 MG tablet Take 1 tablet (50 mg total) by mouth every 6 (six) hours as needed for severe pain.   vitamin B-12 (CYANOCOBALAMIN) 1000 MCG tablet Take 1,000 mcg by mouth daily.   [DISCONTINUED] aspirin EC 81 MG tablet Take 81 mg by mouth daily.   No facility-administered medications prior to visit.    Review of Systems     Objective    BP (!) 164/54 (BP Location: Right Arm, Patient Position: Sitting, Cuff Size: Large)   Pulse (!) 58   Temp 98.7 F (37.1 C) (Oral)   Resp 16   Wt 179 lb 14.4 oz (81.6 kg)   BMI 33.99 kg/m    Physical Exam Pulmonary:     Effort: Pulmonary effort is normal.  Abdominal:     General: Bowel sounds are normal. There is no distension or abdominal bruit. There are no signs of injury.     Palpations: Abdomen is soft. There is no shifting dullness, hepatomegaly, mass or pulsatile mass.     Tenderness: There is abdominal tenderness in the epigastric area.      No results found for any visits on 05/23/22.  Assessment & Plan     Problem List  Items Addressed This Visit       Other   Abdominal pain of unknown cause - Primary    Subacute, improved from 05/16/22 Patient presents for ED follow up for abdominal pain in RLQ, reports tenderness in epigastric region on exam  Weight has increased since June 2023 177>179 Abdominal and pelvic CT 05/16/22 did not show structural abnormality related to pain  Pain responsive to tramadol, tylenol and rest  Recommended improving bowel regimen and follow up in 3 weeks to see if she continues to have pain and may need gastro referral for further evaluation  Has had hx of benign colon polyp           Return in about 3 weeks (around 06/13/2022) for abdominal pain .     I, Eulis Foster, MD, have reviewed all documentation for this visit. The documentation on 05/23/22 for the exam, diagnosis, procedures, and orders are all accurate and complete.    Eulis Foster, MD  [facility] [phone] (phone) [phone] (fax)  [place]

## 2022-05-30 DIAGNOSIS — I10 Essential (primary) hypertension: Secondary | ICD-10-CM | POA: Diagnosis not present

## 2022-05-30 DIAGNOSIS — N261 Atrophy of kidney (terminal): Secondary | ICD-10-CM | POA: Diagnosis not present

## 2022-05-30 DIAGNOSIS — R600 Localized edema: Secondary | ICD-10-CM | POA: Diagnosis not present

## 2022-05-30 DIAGNOSIS — N1832 Chronic kidney disease, stage 3b: Secondary | ICD-10-CM | POA: Diagnosis not present

## 2022-05-30 DIAGNOSIS — N1330 Unspecified hydronephrosis: Secondary | ICD-10-CM | POA: Diagnosis not present

## 2022-06-05 DIAGNOSIS — I129 Hypertensive chronic kidney disease with stage 1 through stage 4 chronic kidney disease, or unspecified chronic kidney disease: Secondary | ICD-10-CM | POA: Diagnosis not present

## 2022-06-05 DIAGNOSIS — N1330 Unspecified hydronephrosis: Secondary | ICD-10-CM | POA: Diagnosis not present

## 2022-06-05 DIAGNOSIS — N261 Atrophy of kidney (terminal): Secondary | ICD-10-CM | POA: Diagnosis not present

## 2022-06-05 DIAGNOSIS — R600 Localized edema: Secondary | ICD-10-CM | POA: Diagnosis not present

## 2022-06-05 DIAGNOSIS — N1832 Chronic kidney disease, stage 3b: Secondary | ICD-10-CM | POA: Diagnosis not present

## 2022-06-08 ENCOUNTER — Other Ambulatory Visit: Payer: Self-pay | Admitting: Family Medicine

## 2022-06-08 NOTE — Telephone Encounter (Signed)
Refilled 02/13/2022 #90 1 rf. Requested Prescriptions  Pending Prescriptions Disp Refills  . Olmesartan-amLODIPine-HCTZ 40-10-25 MG TABS [Pharmacy Med Name: OLMESARTAN/AMLOD/HCTZ 40-10-25MG T] 90 tablet 1    Sig: TAKE 1 TABLET BY MOUTH DAILY     Cardiovascular: CCB + ARB + Diuretic Combos Failed - 06/08/2022  1:15 PM      Failed - Cr in normal range and within 180 days    Creat  Date Value Ref Range Status  07/02/2017 1.38 (H) 0.60 - 0.88 mg/dL Final    Comment:    For patients >16 years of age, the reference limit for Creatinine is approximately 13% higher for people identified as African-American. .    Creatinine, Ser  Date Value Ref Range Status  05/16/2022 1.19 (H) 0.44 - 1.00 mg/dL Final         Failed - Last BP in normal range    BP Readings from Last 1 Encounters:  05/23/22 (!) 164/54         Passed - K in normal range and within 180 days    Potassium  Date Value Ref Range Status  05/16/2022 3.6 3.5 - 5.1 mmol/L Final  12/24/2013 3.4 (L) 3.5 - 5.1 mmol/L Final         Passed - Na in normal range and within 180 days    Sodium  Date Value Ref Range Status  05/16/2022 136 135 - 145 mmol/L Final  01/12/2022 130 (L) 134 - 144 mmol/L Final  12/20/2013 134 (L) 136 - 145 mmol/L Final         Passed - eGFR is 10 or above and within 180 days    EGFR (African American)  Date Value Ref Range Status  12/24/2013 >60  Final   GFR calc Af Amer  Date Value Ref Range Status  01/06/2020 43 (L) >59 mL/min/1.73 Final    Comment:    **[facility] currently reports eGFR in compliance with the current**   recommendations of the [employer]. [facility] will   update reporting as new guidelines are published from the NKF-ASN   Task force.    EGFR (Non-African Amer.)  Date Value Ref Range Status  12/24/2013 >60  Final    Comment:    eGFR values <98m/min/1.73 m2 may be an indication of chronic kidney disease (CKD). Calculated eGFR is useful in patients with stable  renal function. The eGFR calculation will not be reliable in acutely ill patients when serum creatinine is changing rapidly. It is not useful in  patients on dialysis. The eGFR calculation may not be applicable to patients at the low and high extremes of body sizes, pregnant women, and vegetarians.    GFR, Estimated  Date Value Ref Range Status  05/16/2022 45 (L) >60 mL/min Final    Comment:    (NOTE) Calculated using the CKD-EPI Creatinine Equation (2021)    eGFR  Date Value Ref Range Status  01/12/2022 43 (L) >59 mL/min/1.73 Final         Passed - Patient is not pregnant      Passed - Last Heart Rate in normal range    Pulse Readings from Last 1 Encounters:  05/23/22 (!) 58         Passed - Valid encounter within last 6 months    Recent Outpatient Visits          2 weeks ago Abdominal pain of unknown cause   [facility] [place]   1 month ago Aortic atherosclerosis (  [facility])   [facility] Banana., MD   3 months ago Primary hypertension   [facility] Jer[facility] Banana., MD   3 months ago Herpes zoster with complication   [facility] Jerrol Banana., MD   4 months ago Preoperative clearance   [place], PA-C      Future Appointments            In 5 days Simmons-Robinson, Riki Sheer, MD [facility], [place]

## 2022-06-13 ENCOUNTER — Encounter: Payer: Self-pay | Admitting: Family Medicine

## 2022-06-13 ENCOUNTER — Ambulatory Visit (INDEPENDENT_AMBULATORY_CARE_PROVIDER_SITE_OTHER): Payer: Medicare Other | Admitting: Family Medicine

## 2022-06-13 VITALS — BP 150/72 | HR 56 | Temp 98.1°F | Resp 16 | Wt 180.7 lb

## 2022-06-13 DIAGNOSIS — Z23 Encounter for immunization: Secondary | ICD-10-CM | POA: Insufficient documentation

## 2022-06-13 DIAGNOSIS — M79604 Pain in right leg: Secondary | ICD-10-CM | POA: Diagnosis not present

## 2022-06-13 NOTE — Progress Notes (Signed)
Established patient visit  I,[name],acting as a scribe for [employer], MD.,have documented all relevant documentation on the behalf of Eulis Foster, MD,as directed by  Eulis Foster, MD while in the presence of Eulis Foster, MD.   Patient: Dawn [name]   DOB: 07/02/1934   88 y.o. Female  MRN: [id number] Visit Date: 06/13/2022  Today's healthcare provider: Eulis Foster, MD   Chief Complaint  Patient presents with   Follow-up   Subjective    HPI  Follow up for abdominal pain  The patient was last seen for this 3 weeks ago. Changes made at last visit include Recommended improving bowel regimen and if pain continues may need gastro referral for further evaluation.  Today, patient clarifies that her pain was not in her abdomen but is more in her right leg/heel she states that she has significant medical history and sometimes unable to clearly state where she is having pain She reports that the pain occurs when she is walking and is better with rest She states that overall pain has improved since her last visit  Influenza vaccination Patient presents for influenza vaccine. ----------------------------------------------------------------------------------------   Medications: Outpatient Medications Prior to Visit  Medication Sig   acetaminophen (TYLENOL) 325 MG tablet Take 650 mg by mouth every 6 (six) hours as needed for mild pain.   Biotin 5000 MCG CAPS Take 5,000 mcg by mouth daily.   cetirizine (ZYRTEC) 10 MG tablet Take 1 tablet (10 mg total) by mouth daily.   labetalol (NORMODYNE) 200 MG tablet TAKE 1 TABLET BY MOUTH 3 TIMES  DAILY   Lysine 500 MG CAPS Take 1 capsule by mouth daily.   Magnesium 250 MG TABS Take 1 tablet by mouth daily.    Magnesium 500 MG TABS Take by mouth.   Olmesartan-amLODIPine-HCTZ 40-10-25 MG TABS Take 1 tablet by mouth daily.   pantoprazole (PROTONIX) 20 MG tablet TAKE 1  TABLET(20 MG) BY MOUTH DAILY   simvastatin (ZOCOR) 20 MG tablet TAKE 1 TABLET BY MOUTH  DAILY   traMADol (ULTRAM) 50 MG tablet Take 1 tablet (50 mg total) by mouth every 6 (six) hours as needed for severe pain.   labetalol (NORMODYNE) 200 MG tablet Take by mouth. (Patient not taking: Reported on 06/13/2022)   polyethylene glycol powder (GLYCOLAX/MIRALAX) 17 GM/SCOOP powder Take 17 g by mouth daily.   senna (SENOKOT) 8.6 MG tablet Take 1 tablet (8.6 mg total) by mouth daily.   vitamin B-12 (CYANOCOBALAMIN) 1000 MCG tablet Take 1,000 mcg by mouth daily.   [DISCONTINUED] BIOTIN SL Place under the tongue.   [DISCONTINUED] cholecalciferol (VITAMIN D3) 25 MCG (1000 UNIT) tablet Take 1,000 Units by mouth daily.   [DISCONTINUED] clotrimazole-betamethasone (LOTRISONE) cream Apply 1 application topically daily.   No facility-administered medications prior to visit.    Review of Systems     Objective    BP (!) 150/72 (BP Location: Left Arm, Patient Position: Sitting, Cuff Size: Large)   Pulse (!) 56   Temp 98.1 F (36.7 C) (Oral)   Resp 16   Wt 180 lb 11.2 oz (82 kg)   BMI 34.14 kg/m    Physical Exam Pulmonary:     Effort: Pulmonary effort is normal.  Musculoskeletal:     Right hip: No tenderness. Normal range of motion. Normal strength.     Left hip: No tenderness. Normal range of motion. Normal strength.     Right upper leg: Edema present. No tenderness.     Left  upper leg: Edema present. No tenderness.     Comments: Patient without tenderness on ROM testing of right hip  She has 4/5 strength in bilateral LE         No results found for any visits on 06/13/22.  Assessment & Plan     Problem List Items Addressed This Visit       Other   Need for immunization against influenza - Primary    Influenza vaccine given today      Relevant Orders   Flu Vaccine QUAD High Dose(Fluad) (Completed)   Right leg pain    Subacute Patient states that this is overall improved Offered  physical therapy, patient declines at this time stating that she has had a lot of medical problems this year and needs to rest Since pain is improved, recommended that patient follow-up in 3 months for chronic conditions Recommended that she seek urgent evaluation if pain worsens or she has inability to ambulate, patient was agreeable with this plan         Return in about 3 months (around 09/13/2022) for chronic conditions .      I, Eulis Foster, MD, have reviewed all documentation for this visit. The documentation on 06/13/22 for the exam, diagnosis, procedures, and orders are all accurate and complete.    Eulis Foster, MD  [facility] [phone] (phone) [phone] (fax)  [place]

## 2022-06-13 NOTE — Assessment & Plan Note (Signed)
Influenza vaccine given today

## 2022-06-13 NOTE — Assessment & Plan Note (Signed)
Subacute Patient states that this is overall improved Offered physical therapy, patient declines at this time stating that she has had a lot of medical problems this year and needs to rest Since pain is improved, recommended that patient follow-up in 3 months for chronic conditions Recommended that she seek urgent evaluation if pain worsens or she has inability to ambulate, patient was agreeable with this plan

## 2022-06-14 ENCOUNTER — Telehealth: Payer: Self-pay | Admitting: Family Medicine

## 2022-06-14 NOTE — Telephone Encounter (Signed)
Pt states she had a message on her answering from [facility] that said they had reached out 2 times to her dr w/ no response for a refill request, and they could not fill her prescriptions. Pt does not know which meds they are referring to. But would like to know if someone can "straiten this out".

## 2022-06-14 NOTE — Telephone Encounter (Signed)
Called [facility], spoke with Erin a Pharmacist. She states that they reached out to the patient for a refill of Olmesartan-amlodipine-HCTZ. Called patient and advised her of which medication they were inquiring about. Patient states that she received that medication about a week ago. Patient should have enough refills until end of November,after that she needs a new prescription. Patient verb understanding.

## 2022-06-19 DIAGNOSIS — H04123 Dry eye syndrome of bilateral lacrimal glands: Secondary | ICD-10-CM | POA: Diagnosis not present

## 2022-08-01 DIAGNOSIS — I700 Atherosclerosis of aorta: Secondary | ICD-10-CM | POA: Diagnosis not present

## 2022-08-01 DIAGNOSIS — I10 Essential (primary) hypertension: Secondary | ICD-10-CM | POA: Diagnosis not present

## 2022-08-09 ENCOUNTER — Telehealth: Payer: Self-pay | Admitting: Family Medicine

## 2022-08-09 DIAGNOSIS — I10 Essential (primary) hypertension: Secondary | ICD-10-CM

## 2022-08-09 NOTE — Telephone Encounter (Signed)
[facility] pharmacy faxed refill request for the following medications:    Olmesartan-amLODIPine-HCTZ 40-10-25 MG TABS   Please advise

## 2022-08-10 MED ORDER — OLMESARTAN-AMLODIPINE-HCTZ 40-10-25 MG PO TABS: 1.0000 | ORAL_TABLET | Freq: Every day | ORAL | 1 refills | Status: DC

## 2022-08-10 NOTE — Telephone Encounter (Signed)
Done

## 2022-09-19 ENCOUNTER — Ambulatory Visit: Payer: Medicare Other | Admitting: Family Medicine

## 2022-09-21 NOTE — Progress Notes (Signed)
Established patient visit   Patient: Dawn [name]   DOB: [birth year]   89 y.o. Female  MRN: [id number] Visit Date: 09/22/2022  Today's healthcare provider: Eulis Foster, MD   Chief Complaint  Patient presents with  . Leg Pain   Subjective    Leg Pain  The pain is present in the right hip. The pain is at a severity of 0/10. The patient is experiencing no pain. She reports no foreign bodies present. She has tried nothing for the symptoms. The treatment provided significant relief.     Hypertension, follow-up  BP Readings from Last 3 Encounters:  09/22/22 (!) 173/47  06/13/22 (!) 150/72  05/23/22 (!) 164/54   Wt Readings from Last 3 Encounters:  09/22/22 182 lb (82.6 kg)  06/13/22 180 lb 11.2 oz (82 kg)  05/23/22 179 lb 14.4 oz (81.6 kg)     She was last seen for hypertension 3 months ago.  BP at that visit was 150/72. Management since that visit includes olmesartan-amlodipine-HCTZ 40-10-25 combination pill and labetalol.  Patient reports that her cardiologist, Nehemiah Massed, wanted her to start two of labetaolol twice daily instead of three times daily due to her lower diastolic BP   Neuropathy  She reports having terrible time with her hands feeling numb when she is trying to write or while holding a book, has trouble holding pen  She has hx of neuropathy  She states she had nerve conduction studies over 1 decade ago   She reports excellent compliance with treatment. She is not having side effects. She is following a Regular diet. She is not exercising. She does not smoke.   Outside blood pressures are similar to the 160s/50-60s  Symptoms: No chest pain No chest pressure  No palpitations No syncope  No dyspnea No orthopnea  No paroxysmal nocturnal dyspnea No lower extremity edema   Pertinent labs Lab Results  Component Value Date   CHOL 158 03/31/2021   HDL 57 03/31/2021   LDLCALC 77 03/31/2021   TRIG 141 03/31/2021   CHOLHDL 2.8 03/31/2021    Lab Results  Component Value Date   NA 136 05/16/2022   K 3.6 05/16/2022   CREATININE 1.19 (H) 05/16/2022   GFRNONAA 45 (L) 05/16/2022   GLUCOSE 101 (H) 05/16/2022   TSH 1.960 03/31/2021     The ASCVD Risk score (Arnett DK, et al., 2019) failed to calculate for the following reasons:   The 2019 ASCVD risk score is only valid for ages 90 to 64  --------------------------------------------------------------------------------------------------- HLD: patient takes simvastatin for this daily, 41m  Medications: Outpatient Medications Prior to Visit  Medication Sig  . acetaminophen (TYLENOL) 325 MG tablet Take 650 mg by mouth every 6 (six) hours as needed for mild pain.  . Biotin 5000 MCG CAPS Take 5,000 mcg by mouth daily.  .Marland Kitchenlabetalol (NORMODYNE) 200 MG tablet Take by mouth.  . Lysine 500 MG CAPS Take 1 capsule by mouth daily.  . Magnesium 500 MG TABS Take by mouth.  . Olmesartan-amLODIPine-HCTZ 40-10-25 MG TABS Take 1 tablet by mouth daily.  . pantoprazole (PROTONIX) 20 MG tablet TAKE 1 TABLET(20 MG) BY MOUTH DAILY  . simvastatin (ZOCOR) 20 MG tablet TAKE 1 TABLET BY MOUTH  DAILY  . [DISCONTINUED] cetirizine (ZYRTEC) 10 MG tablet Take 1 tablet (10 mg total) by mouth daily.  . [DISCONTINUED] labetalol (NORMODYNE) 200 MG tablet TAKE 1 TABLET BY MOUTH 3 TIMES  DAILY  . [DISCONTINUED] Magnesium 250 MG TABS Take  1 tablet by mouth daily.   . [DISCONTINUED] polyethylene glycol powder (GLYCOLAX/MIRALAX) 17 GM/SCOOP powder Take 17 g by mouth daily.  . [DISCONTINUED] senna (SENOKOT) 8.6 MG tablet Take 1 tablet (8.6 mg total) by mouth daily.  . [DISCONTINUED] traMADol (ULTRAM) 50 MG tablet Take 1 tablet (50 mg total) by mouth every 6 (six) hours as needed for severe pain.  . [DISCONTINUED] vitamin B-12 (CYANOCOBALAMIN) 1000 MCG tablet Take 1,000 mcg by mouth daily.   No facility-administered medications prior to visit.    Review of Systems  {Labs  Heme  Chem  Endocrine  Serology   Results Review (optional):23779}   Objective    BP (!) 173/47   Pulse (!) 59   Ht '5\' 1"'$  (1.549 m)   Wt 182 lb (82.6 kg)   SpO2 100%   BMI 34.39 kg/m  {Show previous vital signs (optional):23777}  Physical Exam  ***  No results found for any visits on 09/22/22.  Assessment & Plan     Problem List Items Addressed This Visit       Cardiovascular and Mediastinum   Accelerated essential hypertension - Primary     Digestive   GERD (gastroesophageal reflux disease)     Genitourinary   Chronic kidney disease, stage 3 unspecified ([place])     Other   Hypercholesteremia   Borderline diabetes     No follow-ups on file.      I, Eulis Foster, MD, have reviewed all documentation for this visit.  Portions of this information were initially documented by the CMA and reviewed by me for thoroughness and accuracy.      Eulis Foster, MD  [facility] [phone] (phone) [phone] (fax)  [place]

## 2022-09-22 ENCOUNTER — Encounter: Payer: Self-pay | Admitting: Family Medicine

## 2022-09-22 ENCOUNTER — Ambulatory Visit (INDEPENDENT_AMBULATORY_CARE_PROVIDER_SITE_OTHER): Payer: Medicare Other | Admitting: Family Medicine

## 2022-09-22 VITALS — BP 160/54 | HR 59 | Ht 61.0 in | Wt 182.0 lb

## 2022-09-22 DIAGNOSIS — N1831 Chronic kidney disease, stage 3a: Secondary | ICD-10-CM

## 2022-09-22 DIAGNOSIS — K219 Gastro-esophageal reflux disease without esophagitis: Secondary | ICD-10-CM

## 2022-09-22 DIAGNOSIS — E7800 Pure hypercholesterolemia, unspecified: Secondary | ICD-10-CM | POA: Diagnosis not present

## 2022-09-22 DIAGNOSIS — G629 Polyneuropathy, unspecified: Secondary | ICD-10-CM | POA: Diagnosis not present

## 2022-09-22 DIAGNOSIS — R7303 Prediabetes: Secondary | ICD-10-CM | POA: Diagnosis not present

## 2022-09-22 DIAGNOSIS — I10 Essential (primary) hypertension: Secondary | ICD-10-CM | POA: Diagnosis not present

## 2022-09-22 DIAGNOSIS — M79604 Pain in right leg: Secondary | ICD-10-CM

## 2022-09-22 MED ORDER — LABETALOL HCL 200 MG PO TABS: 200.0000 mg | ORAL_TABLET | Freq: Three times a day (TID) | ORAL | Status: DC

## 2022-09-22 NOTE — Patient Instructions (Signed)
Please start back taking your labetalol 3 times daily and record your blood pressure readings   Your blood pressure has remained elevated with top number greater than 150. Goal BP would be less than 150 on the top

## 2022-09-24 NOTE — Assessment & Plan Note (Signed)
BP elevated on multiple measurements in office  Increased to labetalol '200mg'$  3 times daily from twice daily  Follow up for BP recheck in two weeks recommended

## 2022-09-24 NOTE — Assessment & Plan Note (Signed)
Stable  Continue current regimen  No changes today

## 2022-09-24 NOTE — Assessment & Plan Note (Signed)
Patient prefers to hold off on additional specialist at this time  Will follow up on issue based on pt preference

## 2022-09-24 NOTE — Assessment & Plan Note (Signed)
Stable  Continue statin therapy

## 2022-09-24 NOTE — Assessment & Plan Note (Signed)
Resolved

## 2022-10-06 ENCOUNTER — Encounter: Payer: Self-pay | Admitting: Family Medicine

## 2022-10-06 ENCOUNTER — Ambulatory Visit (INDEPENDENT_AMBULATORY_CARE_PROVIDER_SITE_OTHER): Payer: Medicare Other | Admitting: Family Medicine

## 2022-10-06 VITALS — BP 148/69 | HR 71 | Temp 98.6°F | Resp 16 | Wt 182.6 lb

## 2022-10-06 DIAGNOSIS — H938X1 Other specified disorders of right ear: Secondary | ICD-10-CM | POA: Diagnosis not present

## 2022-10-06 DIAGNOSIS — I10 Essential (primary) hypertension: Secondary | ICD-10-CM

## 2022-10-06 NOTE — Patient Instructions (Signed)
Please continue your labetalol '200mg'$  three times daily.   If you ever have symptoms of feeling lightheaded or blood pressure becomes low (less than 110/50) please decrease the labetalol back to twice daily and notify our office.   Also if blood pressures are consistently elevated more than 180/100 and if you are having headaches or one side of the body feels weak, please be seen in the emergency department

## 2022-10-06 NOTE — Assessment & Plan Note (Signed)
BP at goal, less than 150/90 Patient reports adherence to labetalol '200mg'$  three times daily  Continue current treatment  Recommended decreased frequency if she develops symptoms of hypotension or BP consistently less than 110/50 She is followed by cardiology  Follow up in 3 months  Recommended checking blood pressure 3-4 times weekly  ED precautions discussed, see AVS for review

## 2022-10-06 NOTE — Assessment & Plan Note (Signed)
Follows with Dr. Tami Ribas from ENT  Was able to remove moderate amount of cerumen from right ear canal with small ear currette  Patient reported improved but not resolved hearing changes  Recommended she follow up with ENT, patient agreeable to this plan

## 2022-10-06 NOTE — Progress Notes (Signed)
I,[name],acting as a scribe for [employer], MD.,have documented all relevant documentation on the behalf of Eulis Foster, MD,as directed by  Eulis Foster, MD while in the presence of Eulis Foster, MD.   Established patient visit   Patient: Dawn [name]   DOB: 08/31/1934   89 y.o. Female  MRN: [id number] Visit Date: 10/06/2022  Today's healthcare provider: Eulis Foster, MD   Chief Complaint  Patient presents with   Follow-up   Subjective    HPI  Hypertension, follow-up  BP Readings from Last 3 Encounters:  10/06/22 (!) 148/69  09/22/22 (!) 160/54  06/13/22 (!) 150/72   Wt Readings from Last 3 Encounters:  10/06/22 182 lb 9.6 oz (82.8 kg)  09/22/22 182 lb (82.6 kg)  06/13/22 180 lb 11.2 oz (82 kg)     She was last seen for hypertension 2 weeks ago.  BP at that visit was 160/54. Management since that visit includes increased to labetalol '200mg'$  3 times daily from twice daily .  She reports excellent compliance with treatment. She is not having side effects.    Outside blood pressures are 120-147/49-75  120/56 141/59 122/75 133/64 147/59 142/67  Symptoms: Some lightheaded in the past  No chest pain No chest pressure  No palpitations No syncope  No dyspnea No orthopnea  No paroxysmal nocturnal dyspnea No lower extremity edema   Pertinent labs Lab Results  Component Value Date   CHOL 158 03/31/2021   HDL 57 03/31/2021   LDLCALC 77 03/31/2021   TRIG 141 03/31/2021   CHOLHDL 2.8 03/31/2021   Lab Results  Component Value Date   NA 136 05/16/2022   K 3.6 05/16/2022   CREATININE 1.19 (H) 05/16/2022   GFRNONAA 45 (L) 05/16/2022   GLUCOSE 101 (H) 05/16/2022   TSH 1.960 03/31/2021     The ASCVD Risk score (Arnett DK, et al., 2019) failed to calculate for the following reasons:   The 2019 ASCVD risk score is only valid for ages 40 to  7  --------------------------------------------------------------------------------------------------- Right Ear Fullness Right ear feels clogged Reports she sees dr Tami Ribas  Has been 1 yr since last  She does not use   Medications: Outpatient Medications Prior to Visit  Medication Sig   acetaminophen (TYLENOL) 325 MG tablet Take 650 mg by mouth every 6 (six) hours as needed for mild pain.   Biotin 5000 MCG CAPS Take 5,000 mcg by mouth daily.   labetalol (NORMODYNE) 200 MG tablet Take 1 tablet (200 mg total) by mouth 3 (three) times daily.   Lysine 500 MG CAPS Take 1 capsule by mouth daily.   Magnesium 500 MG TABS Take by mouth.   Olmesartan-amLODIPine-HCTZ 40-10-25 MG TABS Take 1 tablet by mouth daily.   pantoprazole (PROTONIX) 20 MG tablet TAKE 1 TABLET(20 MG) BY MOUTH DAILY   simvastatin (ZOCOR) 20 MG tablet TAKE 1 TABLET BY MOUTH  DAILY   No facility-administered medications prior to visit.    Review of Systems     Objective    BP (!) 148/69 (BP Location: Right Arm, Patient Position: Sitting, Cuff Size: Normal)   Pulse 71   Temp 98.6 F (37 C) (Oral)   Resp 16   Wt 182 lb 9.6 oz (82.8 kg)   BMI 34.50 kg/m    Physical Exam Vitals reviewed.  Constitutional:      General: She is not in acute distress.    Appearance: Normal appearance. She is not ill-appearing, toxic-appearing or diaphoretic.  HENT:     Right Ear: Decreased hearing noted. No drainage or tenderness. There is impacted cerumen.     Left Ear: No decreased hearing noted. No drainage or tenderness. There is no impacted cerumen.  Eyes:     Conjunctiva/sclera: Conjunctivae normal.  Cardiovascular:     Rate and Rhythm: Normal rate and regular rhythm.     Pulses: Normal pulses.     Heart sounds: Murmur heard.     No friction rub. No gallop.  Pulmonary:     Effort: Pulmonary effort is normal. No respiratory distress.     Breath sounds: Normal breath sounds. No stridor. No wheezing, rhonchi or rales.   Abdominal:     General: Bowel sounds are normal. There is no distension.     Palpations: Abdomen is soft.     Tenderness: There is no abdominal tenderness.  Musculoskeletal:     Right lower leg: No edema.     Left lower leg: No edema.  Skin:    Findings: No erythema or rash.  Neurological:     Mental Status: She is alert and oriented to person, place, and time.      No results found for any visits on 10/06/22.  Assessment & Plan     Problem List Items Addressed This Visit       Cardiovascular and Mediastinum   Hypertension - Primary    BP at goal, less than 150/90 Patient reports adherence to labetalol '200mg'$  three times daily  Continue current treatment  Recommended decreased frequency if she develops symptoms of hypotension or BP consistently less than 110/50 She is followed by cardiology  Follow up in 3 months  Recommended checking blood pressure 3-4 times weekly  ED precautions discussed, see AVS for review         Nervous and Auditory   Ear fullness, right    Follows with Dr. Tami Ribas from ENT  Was able to remove moderate amount of cerumen from right ear canal with small ear currette  Patient reported improved but not resolved hearing changes  Recommended she follow up with ENT, patient agreeable to this plan        Return in about 3 months (around 01/05/2023) for chronic conditions .      The entirety of the information documented in the History of Present Illness, Review of Systems and Physical Exam were personally obtained by me. Portions of this information were initially documented by Lyndel Pleasure, CMA and reviewed by me for thoroughness and accuracy.Eulis Foster, MD    Eulis Foster, MD  [facility] [phone] (phone) [phone] (fax)  [place]

## 2022-10-30 ENCOUNTER — Other Ambulatory Visit: Payer: Self-pay

## 2022-10-30 ENCOUNTER — Telehealth: Payer: Self-pay | Admitting: Family Medicine

## 2022-10-30 MED ORDER — SIMVASTATIN 20 MG PO TABS: 20.0000 mg | ORAL_TABLET | Freq: Every day | ORAL | 0 refills | Status: DC

## 2022-10-30 NOTE — Telephone Encounter (Signed)
[facility] faxed refill request for the following medications:   simvastatin (ZOCOR) 20 MG tablet    Please advise.

## 2022-11-21 DIAGNOSIS — R600 Localized edema: Secondary | ICD-10-CM | POA: Diagnosis not present

## 2022-11-21 DIAGNOSIS — N1832 Chronic kidney disease, stage 3b: Secondary | ICD-10-CM | POA: Diagnosis not present

## 2022-11-21 DIAGNOSIS — N1330 Unspecified hydronephrosis: Secondary | ICD-10-CM | POA: Diagnosis not present

## 2022-11-21 DIAGNOSIS — I129 Hypertensive chronic kidney disease with stage 1 through stage 4 chronic kidney disease, or unspecified chronic kidney disease: Secondary | ICD-10-CM | POA: Diagnosis not present

## 2022-11-21 DIAGNOSIS — N261 Atrophy of kidney (terminal): Secondary | ICD-10-CM | POA: Diagnosis not present

## 2022-11-28 DIAGNOSIS — I129 Hypertensive chronic kidney disease with stage 1 through stage 4 chronic kidney disease, or unspecified chronic kidney disease: Secondary | ICD-10-CM | POA: Diagnosis not present

## 2022-11-28 DIAGNOSIS — R600 Localized edema: Secondary | ICD-10-CM | POA: Diagnosis not present

## 2022-11-28 DIAGNOSIS — N1330 Unspecified hydronephrosis: Secondary | ICD-10-CM | POA: Diagnosis not present

## 2022-11-28 DIAGNOSIS — N261 Atrophy of kidney (terminal): Secondary | ICD-10-CM | POA: Diagnosis not present

## 2022-11-28 DIAGNOSIS — N1832 Chronic kidney disease, stage 3b: Secondary | ICD-10-CM | POA: Diagnosis not present

## 2022-11-29 DIAGNOSIS — N1832 Chronic kidney disease, stage 3b: Secondary | ICD-10-CM | POA: Diagnosis not present

## 2022-11-29 DIAGNOSIS — N2581 Secondary hyperparathyroidism of renal origin: Secondary | ICD-10-CM | POA: Diagnosis not present

## 2022-11-29 DIAGNOSIS — R600 Localized edema: Secondary | ICD-10-CM | POA: Diagnosis not present

## 2022-11-29 DIAGNOSIS — N261 Atrophy of kidney (terminal): Secondary | ICD-10-CM | POA: Diagnosis not present

## 2022-11-29 DIAGNOSIS — I129 Hypertensive chronic kidney disease with stage 1 through stage 4 chronic kidney disease, or unspecified chronic kidney disease: Secondary | ICD-10-CM | POA: Diagnosis not present

## 2022-12-19 ENCOUNTER — Ambulatory Visit (INDEPENDENT_AMBULATORY_CARE_PROVIDER_SITE_OTHER): Payer: Medicare Other

## 2022-12-19 VITALS — Ht 61.0 in | Wt 182.0 lb

## 2022-12-19 DIAGNOSIS — Z0000 Encounter for general adult medical examination without abnormal findings: Secondary | ICD-10-CM | POA: Diagnosis not present

## 2022-12-19 NOTE — Patient Instructions (Signed)
Dawn [name] , Thank you for taking time to come for your Medicare Wellness Visit. I appreciate your ongoing commitment to your health goals. Please review the following plan we discussed and let me know if I can assist you in the future.   These are the goals we discussed:  Goals      DIET - EAT MORE FRUITS AND VEGETABLES     Eat more fruits and vegetables     Recommend increasing amount of fruits and vegetables in daily diet to 2 servings a day of each.      Prevent falls     Recommend to remove any items from the home that may cause slips or trips.     Reduce caffeine intake     Recommend to decrease the amount of caffeinated  drinks to 2 a day versus 5 a day and increase water intake.         This is a list of the screening recommended for you and due dates:  Health Maintenance  Topic Date Due   DTaP/Tdap/Td vaccine (1 - Tdap) Never done   Zoster (Shingles) Vaccine (1 of 2) Never done   COVID-19 Vaccine (3 - Moderna risk series) 03/14/2020   Flu Shot  04/12/2023   Medicare Annual Wellness Visit  12/19/2023   Pneumonia Vaccine  Completed   DEXA scan (bone density measurement)  Completed   HPV Vaccine  Aged Out    Advanced directives: no  Conditions/risks identified: low falls risk  Next appointment: Follow up in one year for your annual wellness visit 12/25/2023 @1pm  telephone   Preventive Care 65 Years and Older, Female Preventive care refers to lifestyle choices and visits with your health care provider that can promote health and wellness. What does preventive care include? A yearly physical exam. This is also called an annual well check. Dental exams once or twice a year. Routine eye exams. Ask your health care provider how often you should have your eyes checked. Personal lifestyle choices, including: Daily care of your teeth and gums. Regular physical activity. Eating a healthy diet. Avoiding tobacco and drug use. Limiting alcohol use. Practicing safe  sex. Taking low-dose aspirin every day. Taking vitamin and mineral supplements as recommended by your health care provider. What happens during an annual well check? The services and screenings done by your health care provider during your annual well check will depend on your age, overall health, lifestyle risk factors, and family history of disease. Counseling  Your health care provider may ask you questions about your: Alcohol use. Tobacco use. Drug use. Emotional well-being. Home and relationship well-being. Sexual activity. Eating habits. History of falls. Memory and ability to understand (cognition). Work and work Astronomer. Reproductive health. Screening  You may have the following tests or measurements: Height, weight, and BMI. Blood pressure. Lipid and cholesterol levels. These may be checked every 5 years, or more frequently if you are over 39 years old. Skin check. Lung cancer screening. You may have this screening every year starting at age 66 if you have a 30-pack-year history of smoking and currently smoke or have quit within the past 15 years. Fecal occult blood test (FOBT) of the stool. You may have this test every year starting at age 60. Flexible sigmoidoscopy or colonoscopy. You may have a sigmoidoscopy every 5 years or a colonoscopy every 10 years starting at age 1. Hepatitis C blood test. Hepatitis B blood test. Sexually transmitted disease (STD) testing. Diabetes screening. This is done by  checking your blood sugar (glucose) after you have not eaten for a while (fasting). You may have this done every 1-3 years. Bone density scan. This is done to screen for osteoporosis. You may have this done starting at age 29. Mammogram. This may be done every 1-2 years. Talk to your health care provider about how often you should have regular mammograms. Talk with your health care provider about your test results, treatment options, and if necessary, the need for more  tests. Vaccines  Your health care provider may recommend certain vaccines, such as: Influenza vaccine. This is recommended every year. Tetanus, diphtheria, and acellular pertussis (Tdap, Td) vaccine. You may need a Td booster every 10 years. Zoster vaccine. You may need this after age 67. Pneumococcal 13-valent conjugate (PCV13) vaccine. One dose is recommended after age 5. Pneumococcal polysaccharide (PPSV23) vaccine. One dose is recommended after age 29. Talk to your health care provider about which screenings and vaccines you need and how often you need them. This information is not intended to replace advice given to you by your health care provider. Make sure you discuss any questions you have with your health care provider. Document Released: 09/24/2015 Document Revised: 05/17/2016 Document Reviewed: 06/29/2015 Elsevier Interactive Patient Education  2017 [place] Prevention in the Home Falls can cause injuries. They can happen to people of all ages. There are many things you can do to make your home safe and to help prevent falls. What can I do on the outside of my home? Regularly fix the edges of walkways and driveways and fix any cracks. Remove anything that might make you trip as you walk through a door, such as a raised step or threshold. Trim any bushes or trees on the path to your home. Use bright outdoor lighting. Clear any walking paths of anything that might make someone trip, such as rocks or tools. Regularly check to see if handrails are loose or broken. Make sure that both sides of any steps have handrails. Any raised decks and porches should have guardrails on the edges. Have any leaves, snow, or ice cleared regularly. Use sand or salt on walking paths during winter. Clean up any spills in your garage right away. This includes oil or grease spills. What can I do in the bathroom? Use night lights. Install grab bars by the toilet and in the tub and shower.  Do not use towel bars as grab bars. Use non-skid mats or decals in the tub or shower. If you need to sit down in the shower, use a plastic, non-slip stool. Keep the floor dry. Clean up any water that spills on the floor as soon as it happens. Remove soap buildup in the tub or shower regularly. Attach bath mats securely with double-sided non-slip rug tape. Do not have throw rugs and other things on the floor that can make you trip. What can I do in the bedroom? Use night lights. Make sure that you have a light by your bed that is easy to reach. Do not use any sheets or blankets that are too big for your bed. They should not hang down onto the floor. Have a firm chair that has side arms. You can use this for support while you get dressed. Do not have throw rugs and other things on the floor that can make you trip. What can I do in the kitchen? Clean up any spills right away. Avoid walking on wet floors. Keep items that you use a  lot in easy-to-reach places. If you need to reach something above you, use a strong step stool that has a grab bar. Keep electrical cords out of the way. Do not use floor polish or wax that makes floors slippery. If you must use wax, use non-skid floor wax. Do not have throw rugs and other things on the floor that can make you trip. What can I do with my stairs? Do not leave any items on the stairs. Make sure that there are handrails on both sides of the stairs and use them. Fix handrails that are broken or loose. Make sure that handrails are as long as the stairways. Check any carpeting to make sure that it is firmly attached to the stairs. Fix any carpet that is loose or worn. Avoid having throw rugs at the top or bottom of the stairs. If you do have throw rugs, attach them to the floor with carpet tape. Make sure that you have a light switch at the top of the stairs and the bottom of the stairs. If you do not have them, ask someone to add them for you. What else  can I do to help prevent falls? Wear shoes that: Do not have high heels. Have rubber bottoms. Are comfortable and fit you well. Are closed at the toe. Do not wear sandals. If you use a stepladder: Make sure that it is fully opened. Do not climb a closed stepladder. Make sure that both sides of the stepladder are locked into place. Ask someone to hold it for you, if possible. Clearly mark and make sure that you can see: Any grab bars or handrails. First and last steps. Where the edge of each step is. Use tools that help you move around (mobility aids) if they are needed. These include: Canes. Walkers. Scooters. Crutches. Turn on the lights when you go into a dark area. Replace any light bulbs as soon as they burn out. Set up your furniture so you have a clear path. Avoid moving your furniture around. If any of your floors are uneven, fix them. If there are any pets around you, be aware of where they are. Review your medicines with your doctor. Some medicines can make you feel dizzy. This can increase your chance of falling. Ask your doctor what other things that you can do to help prevent falls. This information is not intended to replace advice given to you by your health care provider. Make sure you discuss any questions you have with your health care provider. Document Released: 06/24/2009 Document Revised: 02/03/2016 Document Reviewed: 10/02/2014 Elsevier Interactive Patient Education  2017 [employer].

## 2022-12-19 NOTE — Progress Notes (Signed)
I connected with  Eber Jones on 12/19/22 by a audio enabled telemedicine application and verified that I am speaking with the correct person using two identifiers.  Patient Location: Home  Provider Location: Office/Clinic  I discussed the limitations of evaluation and management by telemedicine. The patient expressed understanding and agreed to proceed.  Subjective:   Dawn [name] is a 89 y.o. female who presents for Medicare Annual (Subsequent) preventive examination.  Review of Systems    Cardiac Risk Factors include: advanced age (>23men, >98 women);dyslipidemia;hypertension;obesity (BMI >30kg/m2);sedentary lifestyle    Objective:    Today's Vitals   12/19/22 1439  Weight: 182 lb (82.6 kg)  Height: 5\' 1"  (1.549 m)  PainSc: 4    Body mass index is 34.39 kg/m.     12/19/2022    2:49 PM 05/16/2022   11:34 AM 12/13/2021    1:52 PM 01/15/2021    2:23 PM 07/13/2020    2:45 PM 07/08/2019    2:34 PM 07/02/2018    3:12 PM  Advanced Directives  Does Patient Have a Medical Advance Directive? No No No No No No No  Would patient like information on creating a medical advance directive?   No - Patient declined  No - Patient declined Yes (ED - Information included in AVS) No - Patient declined    Current Medications (verified) Outpatient Encounter Medications as of 12/19/2022  Medication Sig   acetaminophen (TYLENOL) 325 MG tablet Take 650 mg by mouth every 6 (six) hours as needed for mild pain.   Biotin 5000 MCG CAPS Take 5,000 mcg by mouth daily.   labetalol (NORMODYNE) 200 MG tablet Take 1 tablet (200 mg total) by mouth 3 (three) times daily.   Lysine 500 MG CAPS Take 1 capsule by mouth daily.   Magnesium 500 MG TABS Take by mouth.   Olmesartan-amLODIPine-HCTZ 40-10-25 MG TABS Take 1 tablet by mouth daily.   pantoprazole (PROTONIX) 20 MG tablet TAKE 1 TABLET(20 MG) BY MOUTH DAILY   simvastatin (ZOCOR) 20 MG tablet Take 1 tablet (20 mg total) by mouth daily.   No  facility-administered encounter medications on file as of 12/19/2022.    Allergies (verified) Quinine, Levofloxacin, Statins, Celecoxib, and Codeine   History: Past Medical History:  Diagnosis Date   Acid reflux    Arthritis    Back pain    Gastric polyp 2014   Hypertension    Stomach ulcer    Stroke    Past Surgical History:  Procedure Laterality Date   BACK SURGERY  2011   vertebroplasty    CARDIAC CATHETERIZATION  2007   normal, [employer] [facility] [place]    CARPAL TUNNEL RELEASE  2007   cataract surgery   2013   COLON SURGERY  2015   lap sigmoid resection   COLONOSCOPY  2011, 2015   Dr. Maryruth Bun, Dr Maretta Los   LAMINECTOMY  2011   decompression    UPPER GI ENDOSCOPY  2011   VASCULAR SURGERY Right 2010   right rental artery    Family History  Problem Relation Age of Onset   Cancer Father        stomach   Emphysema Father    Hypertension Mother    Atrial fibrillation Mother    Diabetes Sister    Hypertension Sister    Skin cancer Sister    Hypertension Brother    Heart disease Brother    Heart disease Sister    Colon polyps Sister    Hypertension Sister  Thyroid disease Sister    Irritable bowel syndrome Sister    Breast cancer Sister    Cancer Sister        breast   Heart disease Brother    Colon polyps Brother    Cancer Brother        stomach   Diabetes Brother    Hypertension Brother    Neuropathy Brother    Post-traumatic stress disorder Brother    Hypertension Brother    Pneumonia Brother    Breast cancer Paternal Aunt    Breast cancer Paternal Grandmother    Breast cancer Paternal Aunt    Breast cancer Paternal Aunt    Breast cancer Paternal Aunt    Breast cancer Other    Social History   Socioeconomic History   Marital status: Divorced    Spouse name: none   Number of children: 1   Years of education: 13   Highest education level: Some college, no degree  Occupational History   Occupation: retired  Tobacco Use   Smoking status:  Never   Smokeless tobacco: Never  Vaping Use   Vaping Use: Never used  Substance and Sexual Activity   Alcohol use: No   Drug use: No   Sexual activity: Never  Other Topics Concern   Not on file  Social History Narrative   Not on file   Social Determinants of Health   Financial Resource Strain: Low Risk  (12/19/2022)   Overall Financial Resource Strain (CARDIA)    Difficulty of Paying Living Expenses: Not hard at all  Food Insecurity: No Food Insecurity (12/19/2022)   Hunger Vital Sign    Worried About Running Out of Food in the Last Year: Never true    Ran Out of Food in the Last Year: Never true  Transportation Needs: No Transportation Needs (12/19/2022)   PRAPARE - Administrator, Civil Service (Medical): No    Lack of Transportation (Non-Medical): No  Physical Activity: Inactive (12/19/2022)   Exercise Vital Sign    Days of Exercise per Week: 0 days    Minutes of Exercise per Session: 0 min  Stress: No Stress Concern Present (12/19/2022)   [employer] of Occupational Health - Occupational Stress Questionnaire    Feeling of Stress : Not at all  Social Connections: Socially Isolated (12/19/2022)   Social Connection and Isolation Panel [NHANES]    Frequency of Communication with Friends and Family: More than three times a week    Frequency of Social Gatherings with Friends and Family: Once a week    Attends Religious Services: Never    Database administrator or Organizations: No    Attends Engineer, structural: Never    Marital Status: Divorced    Tobacco Counseling Counseling given: Not Answered   Clinical Intake:  Pre-visit preparation completed: Yes  Pain : 0-10 Pain Score: 4  Pain Type: Chronic pain Pain Location: Hand (both and left wrist) Pain Orientation: Left Pain Descriptors / Indicators: Aching Pain Onset: More than a month ago Pain Frequency: Constant Pain Relieving Factors: Tylenol  Pain Relieving Factors: Tylenol  BMI -  recorded: 34.39 Nutritional Status: BMI > 30  Obese Nutritional Risks: None Diabetes: No  How often do you need to have someone help you when you read instructions, pamphlets, or other written materials from your doctor or pharmacy?: 1 - Never  Diabetic?no  Interpreter Needed?: No  Comments: lives alone Information entered by :: B.Vannia Pola,LPN   Activities of Daily  Living    12/19/2022    2:49 PM 09/22/2022    2:55 PM  In your present state of health, do you have any difficulty performing the following activities:  Hearing? 1 1  Vision? 0 0  Difficulty concentrating or making decisions? 0 0  Walking or climbing stairs? 1 1  Dressing or bathing? 0 0  Doing errands, shopping? 1 1  Comment sometimes:brother helps with errands   Preparing Food and eating ? N   Using the Toilet? N   In the past six months, have you accidently leaked urine? Y   Do you have problems with loss of bowel control? N   Managing your Medications? N   Managing your Finances? N   Housekeeping or managing your Housekeeping? Y     Patient Care Team: Ronnald Ramp, MD as PCP - General (Family Medicine) Lockie Mola, MD as Referring Physician (Ophthalmology) Mosetta Pigeon, MD (Nephrology) Lamar Blinks, MD as Consulting Physician (Cardiology)  Indicate any recent Medical Services you may have received from other than [facility] providers in the past year (date may be approximate).     Assessment:   This is a routine wellness examination for Dedra.  Hearing/Vision screen Hearing Screening - Comments:: Adequate hearing w/left hearing aid Vision Screening - Comments:: Adequate vision w/glasses Dr Inez Pilgrim  Dietary issues and exercise activities discussed: Current Exercise Habits: The patient does not participate in regular exercise at present, Exercise limited by: cardiac condition(s);orthopedic condition(s)   Goals Addressed             This Visit's Progress    DIET -  EAT MORE FRUITS AND VEGETABLES   On track    Eat more fruits and vegetables   On track    Recommend increasing amount of fruits and vegetables in daily diet to 2 servings a day of each.      Prevent falls   On track    Recommend to remove any items from the home that may cause slips or trips.     Reduce caffeine intake   On track    Recommend to decrease the amount of caffeinated  drinks to 2 a day versus 5 a day and increase water intake.        Depression Screen    12/19/2022    2:46 PM 10/06/2022    2:09 PM 09/22/2022    2:55 PM 05/23/2022    2:29 PM 01/12/2022    3:34 PM 12/13/2021    1:50 PM 08/15/2021    2:25 PM  PHQ 2/9 Scores  PHQ - 2 Score 0 0 1 0 3 0 0  PHQ- 9 Score  1 2  3  2     Fall Risk    12/19/2022    2:43 PM 10/06/2022    2:10 PM 09/22/2022    2:55 PM 05/23/2022    2:28 PM 01/12/2022    3:34 PM  Fall Risk   Falls in the past year? 1 1 1 1 1   Number falls in past yr: 0 1 1 0 0  Injury with Fall? 0 1 1 1 1   Comment     wrist  Risk for fall due to : No Fall Risks Impaired balance/gait History of fall(s) Other (Comment)   Follow up Education provided;Falls prevention discussed Falls evaluation completed Falls evaluation completed      FALL RISK PREVENTION PERTAINING TO THE HOME:  Any stairs in or around the home? Yes  If so, are there any  without handrails? Yes  Home free of loose throw rugs in walkways, pet beds, electrical cords, etc? Yes  Adequate lighting in your home to reduce risk of falls? Yes   ASSISTIVE DEVICES UTILIZED TO PREVENT FALLS:  Life alert? No  Use of a cane, walker or w/c? Yes cane sometimes walker after back surgery Grab bars in the bathroom? Yes  Shower chair or bench in shower? Yes  Elevated toilet seat or a handicapped toilet? Yes    Cognitive Function:        12/19/2022    2:52 PM 07/07/2020    2:25 PM 06/28/2017    2:57 PM  6CIT Screen  What Year? 0 points 0 points 0 points  What month? 0 points 0 points 0 points  What time? 0  points 0 points 0 points  Count back from 20 0 points 0 points 0 points  Months in reverse 0 points 0 points 0 points  Repeat phrase 0 points 0 points 2 points  Total Score 0 points 0 points 2 points    Immunizations Immunization History  Administered Date(s) Administered   Fluad Quad(high Dose 65+) 06/11/2019, 07/07/2020, 08/15/2021, 06/13/2022   Influenza, High Dose Seasonal PF 06/16/2015, 06/29/2016, 06/28/2017, 07/02/2018   Influenza,inj,quad, With Preservative 09/11/2016   Moderna Sars-Covid-2 Vaccination 01/17/2020, 02/15/2020   Pneumococcal Conjugate-13 02/17/2015   Pneumococcal Polysaccharide-23 08/14/2012   Pneumococcal-Unspecified 09/11/2016    TDAP status: Up to date  Flu Vaccine status: Up to date  Pneumococcal vaccine status: Up to date  Covid-19 vaccine status: Completed vaccines  Qualifies for Shingles Vaccine? Yes   Zostavax completed No   Shingrix Completed?: No.    Education has been provided regarding the importance of this vaccine. Patient has been advised to call insurance company to determine out of pocket expense if they have not yet received this vaccine. Advised may also receive vaccine at local pharmacy or Health Dept. Verbalized acceptance and understanding.  Screening Tests Health Maintenance  Topic Date Due   DTaP/Tdap/Td (1 - Tdap) Never done   Zoster Vaccines- Shingrix (1 of 2) Never done   COVID-19 Vaccine (3 - Moderna risk series) 03/14/2020   INFLUENZA VACCINE  04/12/2023   Medicare Annual Wellness (AWV)  12/19/2023   Pneumonia Vaccine 5065+ Years old  Completed   DEXA SCAN  Completed   HPV VACCINES  Aged Out    Health Maintenance  Health Maintenance Due  Topic Date Due   DTaP/Tdap/Td (1 - Tdap) Never done   Zoster Vaccines- Shingrix (1 of 2) Never done   COVID-19 Vaccine (3 - Moderna risk series) 03/14/2020    Colorectal cancer screening: No longer required.   Mammogram status: No longer required due to age.   Lung Cancer  Screening: (Low Dose CT Chest recommended if Age 53-80 years, 30 pack-year currently smoking OR have quit w/in 15years.) does not qualify.   Lung Cancer Screening Referral: no  Additional Screening:  Hepatitis C Screening: does not qualify; Completed yes  Vision Screening: Recommended annual ophthalmology exams for early detection of glaucoma and other disorders of the eye. Is the patient up to date with their annual eye exam?  Yes  Who is the provider or what is the name of the office in which the patient attends annual eye exams? Dr [place]ez [place] If pt is not established with a provider, would they like to be referred to a provider to establish care? No .   Dental Screening: Recommended annual dental exams for proper oral hygiene  Community Resource Referral / Chronic Care Management: CRR required this visit?  No   CCM required this visit?  No    Plan:     I have personally reviewed and noted the following in the patient's chart:   Medical and social history Use of alcohol, tobacco or illicit drugs  Current medications and supplements including opioid prescriptions. Patient is not currently taking opioid prescriptions. Functional ability and status Nutritional status Physical activity Advanced directives List of other physicians Hospitalizations, surgeries, and ER visits in previous 12 months Vitals Screenings to include cognitive, depression, and falls Referrals and appointments  In addition, I have reviewed and discussed with patient certain preventive protocols, quality metrics, and best practice recommendations. A written personalized care plan for preventive services as well as general preventive health recommendations were provided to patient.     Sue Lush, LPN   02/11/7857   Nurse Notes: The patient states she is doing well and has no concerns or questions at this time.

## 2022-12-30 ENCOUNTER — Other Ambulatory Visit: Payer: Self-pay | Admitting: Family Medicine

## 2023-01-08 ENCOUNTER — Ambulatory Visit: Payer: Medicare Other | Admitting: Family Medicine

## 2023-01-18 ENCOUNTER — Other Ambulatory Visit: Payer: Self-pay | Admitting: Family Medicine

## 2023-01-18 DIAGNOSIS — I10 Essential (primary) hypertension: Secondary | ICD-10-CM

## 2023-01-18 NOTE — Telephone Encounter (Signed)
Requested Prescriptions  Pending Prescriptions Disp Refills   Olmesartan-amLODIPine-HCTZ 40-10-25 MG TABS [Pharmacy Med Name: Olmesartan-amLODIPine-HCTZ 40-10-25 MG Oral Tablet] 90 tablet 0    Sig: TAKE 1 TABLET BY MOUTH DAILY     Cardiovascular: CCB + ARB + Diuretic Combos Failed - 01/18/2023  5:28 AM      Failed - K in normal range and within 180 days    Potassium  Date Value Ref Range Status  05/16/2022 3.6 3.5 - 5.1 mmol/L Final  12/24/2013 3.4 (L) 3.5 - 5.1 mmol/L Final         Failed - Na in normal range and within 180 days    Sodium  Date Value Ref Range Status  05/16/2022 136 135 - 145 mmol/L Final  01/12/2022 130 (L) 134 - 144 mmol/L Final  12/20/2013 134 (L) 136 - 145 mmol/L Final         Failed - Cr in normal range and within 180 days    Creat  Date Value Ref Range Status  07/02/2017 1.38 (H) 0.60 - 0.88 mg/dL Final    Comment:    For patients >56 years of age, the reference limit for Creatinine is approximately 13% higher for people identified as African-American. .    Creatinine, Ser  Date Value Ref Range Status  05/16/2022 1.19 (H) 0.44 - 1.00 mg/dL Final         Failed - eGFR is 10 or above and within 180 days    EGFR (African American)  Date Value Ref Range Status  12/24/2013 >60  Final   GFR calc Af Amer  Date Value Ref Range Status  01/06/2020 43 (L) >59 mL/min/1.73 Final    Comment:    **[facility] currently reports eGFR in compliance with the current**   recommendations of the [employer]. [facility] will   update reporting as new guidelines are published from the NKF-ASN   Task force.    EGFR (Non-African Amer.)  Date Value Ref Range Status  12/24/2013 >60  Final    Comment:    eGFR values <28mL/min/1.73 m2 may be an indication of chronic kidney disease (CKD). Calculated eGFR is useful in patients with stable renal function. The eGFR calculation will not be reliable in acutely ill patients when serum creatinine is changing  rapidly. It is not useful in  patients on dialysis. The eGFR calculation may not be applicable to patients at the low and high extremes of body sizes, pregnant women, and vegetarians.    GFR, Estimated  Date Value Ref Range Status  05/16/2022 45 (L) >60 mL/min Final    Comment:    (NOTE) Calculated using the CKD-EPI Creatinine Equation (2021)    eGFR  Date Value Ref Range Status  01/12/2022 43 (L) >59 mL/min/1.73 Final         Failed - Last BP in normal range    BP Readings from Last 1 Encounters:  10/06/22 (!) 148/69         Passed - Patient is not pregnant      Passed - Last Heart Rate in normal range    Pulse Readings from Last 1 Encounters:  10/06/22 71         Passed - Valid encounter within last 6 months    Recent Outpatient Visits           3 months ago Primary hypertension   [place] [facility] Sab[place], [place], MD   3 months ago Accelerated essential hypertension   [facility] Simmons-Robinson, [employer], MD   7 months ago Need for immunization against influenza   [place] [facility] Vincent, [place], MD   8 months ago Abdominal pain of unknown cause   [place] [facility] [place], [place], MD   8 months ago Aortic atherosclerosis [facility])   [place] [facility] Bosie Clos, MD       Future Appointments             In 5 days Simmons-Robinson, Tawanna Cooler, MD [facility]

## 2023-01-22 NOTE — Progress Notes (Unsigned)
I,Vanessa  Vital,acting as a Neurosurgeon for [employer], MD.,have documented all relevant documentation on the behalf of Ronnald Ramp, MD,as directed by  Ronnald Ramp, MD while in the presence of Ronnald Ramp, MD.    Established patient visit   Patient: Dawn [name]   DOB: [birth year]   89 y.o. Female  MRN: [id number] Visit Date: 01/23/2023  Today's healthcare provider: Ronnald Ramp, MD   Chief Complaint  Patient presents with   Hypertension   Subjective    HPI  Hypertension, follow-up  BP Readings from Last 3 Encounters:  01/23/23 (!) 148/60  10/06/22 (!) 148/69  09/22/22 (!) 160/54   Wt Readings from Last 3 Encounters:  01/23/23 184 lb 3.2 oz (83.6 kg)  12/19/22 182 lb (82.6 kg)  10/06/22 182 lb 9.6 oz (82.8 kg)     She was last seen for hypertension 3 months ago.  BP at that visit was 148/69. Management since that visit includes labetalol 200mg  three times daily with olmesartan-amlodipine-hctz 40-10-25mg  daily  Continue current treatment   She reports good compliance with treatment.  Outside blood pressures are being checked  Symptoms: No chest pain No chest pressure  No palpitations No syncope  No dyspnea No orthopnea  No paroxysmal nocturnal dyspnea No lower extremity edema   Pertinent labs Lab Results  Component Value Date   CHOL 158 03/31/2021   HDL 57 03/31/2021   LDLCALC 77 03/31/2021   TRIG 141 03/31/2021   CHOLHDL 2.8 03/31/2021   Lab Results  Component Value Date   NA 136 05/16/2022   K 3.6 05/16/2022   CREATININE 1.19 (H) 05/16/2022   GFRNONAA 45 (L) 05/16/2022   GLUCOSE 101 (H) 05/16/2022   TSH 1.960 03/31/2021     The ASCVD Risk score (Arnett DK, et al., 2019) failed to calculate for the following reasons:   The 2019 ASCVD risk score is only valid for ages 85 to 64   The patient has a prior MI or stroke  diagnosis  ---------------------------------------------------------------------------------------------------  Neuropathy  States that she has recently had increased episodes of burning and tingling in her feet  States she has not had relief with gabapentin in the past     Medications: Outpatient Medications Prior to Visit  Medication Sig   acetaminophen (TYLENOL) 325 MG tablet Take 650 mg by mouth every 6 (six) hours as needed for mild pain.   Biotin 5000 MCG CAPS Take 5,000 mcg by mouth daily.   labetalol (NORMODYNE) 200 MG tablet Take 1 tablet (200 mg total) by mouth 3 (three) times daily.   Lysine 500 MG CAPS Take 1 capsule by mouth daily.   Magnesium 500 MG TABS Take by mouth.   pantoprazole (PROTONIX) 20 MG tablet TAKE 1 TABLET(20 MG) BY MOUTH DAILY   simvastatin (ZOCOR) 20 MG tablet TAKE 1 TABLET BY MOUTH ONCE  DAILY   Olmesartan-amLODIPine-HCTZ 40-10-25 MG TABS TAKE 1 TABLET BY MOUTH DAILY   No facility-administered medications prior to visit.    Review of Systems     Objective    BP (!) 148/60 (BP Location: Right Arm, Patient Position: Sitting, Cuff Size: Large)   Pulse (!) 56   Temp 98.4 F (36.9 C) (Oral)   Resp 12   Ht 5\' 1"  (1.549 m)   Wt 184 lb 3.2 oz (83.6 kg)   SpO2 97%   BMI 34.80 kg/m    Physical Exam Vitals reviewed.  Constitutional:      General: She is not in acute  distress.    Appearance: Normal appearance. She is not ill-appearing, toxic-appearing or diaphoretic.  Eyes:     Conjunctiva/sclera: Conjunctivae normal.  Cardiovascular:     Rate and Rhythm: Normal rate and regular rhythm.     Pulses: Normal pulses.     Heart sounds: Normal heart sounds. No murmur heard.    No friction rub. No gallop.  Pulmonary:     Effort: Pulmonary effort is normal. No respiratory distress.     Breath sounds: Normal breath sounds. No stridor. No wheezing, rhonchi or rales.  Abdominal:     General: Bowel sounds are normal. There is no distension.      Palpations: Abdomen is soft.     Tenderness: There is no abdominal tenderness.  Musculoskeletal:     Right lower leg: No edema.     Left lower leg: No edema.  Skin:    Findings: No erythema or rash.  Neurological:     Mental Status: She is alert and oriented to person, place, and time.       No results found for any visits on 01/23/23.  Assessment & Plan     Problem List Items Addressed This Visit       Cardiovascular and Mediastinum   Accelerated essential hypertension    Chronic  BP at goal of less than 150/90  Will continue current medications  No changes to medications today         Nervous and Auditory   Neuropathy - Primary    Chronic  Symptoms becoming more frequent  Reports gabapentin did not help symptoms much in the past, does not want to start lyrica  Patient does not want to take narcotics  Recommended trial of cymbalta 30mg        Relevant Medications   DULoxetine (CYMBALTA) 30 MG capsule     Return in about 6 months (around 07/26/2023) for HTN, neuropathy .        The entirety of the information documented in the History of Present Illness, Review of Systems and Physical Exam were personally obtained by me. Portions of this information were initially documented by Lubertha Basque, CMA . I, Ronnald Ramp, MD have reviewed the documentation above for thoroughness and accuracy.      Ronnald Ramp, MD  [facility] [phone] (phone) [phone] (fax)  [facility]

## 2023-01-23 ENCOUNTER — Ambulatory Visit (INDEPENDENT_AMBULATORY_CARE_PROVIDER_SITE_OTHER): Payer: Medicare Other | Admitting: Family Medicine

## 2023-01-23 ENCOUNTER — Encounter: Payer: Self-pay | Admitting: Family Medicine

## 2023-01-23 VITALS — BP 148/60 | HR 56 | Temp 98.4°F | Resp 12 | Ht 61.0 in | Wt 184.2 lb

## 2023-01-23 DIAGNOSIS — G629 Polyneuropathy, unspecified: Secondary | ICD-10-CM | POA: Diagnosis not present

## 2023-01-23 DIAGNOSIS — I10 Essential (primary) hypertension: Secondary | ICD-10-CM | POA: Diagnosis not present

## 2023-01-23 MED ORDER — DULOXETINE HCL 30 MG PO CPEP: 30.0000 mg | ORAL_CAPSULE | Freq: Every day | ORAL | 1 refills | Status: DC

## 2023-01-23 NOTE — Assessment & Plan Note (Signed)
Chronic  Symptoms becoming more frequent  Reports gabapentin did not help symptoms much in the past, does not want to start lyrica  Patient does not want to take narcotics  Recommended trial of cymbalta 30mg 

## 2023-01-23 NOTE — Assessment & Plan Note (Signed)
Chronic  BP at goal of less than 150/90  Will continue current medications  No changes to medications today

## 2023-02-14 ENCOUNTER — Other Ambulatory Visit: Payer: Self-pay | Admitting: Family Medicine

## 2023-02-14 DIAGNOSIS — I10 Essential (primary) hypertension: Secondary | ICD-10-CM

## 2023-02-14 MED ORDER — OLMESARTAN-AMLODIPINE-HCTZ 40-10-25 MG PO TABS: 1.0000 | ORAL_TABLET | Freq: Every day | ORAL | 3 refills | Status: DC

## 2023-02-16 ENCOUNTER — Telehealth: Payer: Self-pay | Admitting: Family Medicine

## 2023-02-16 ENCOUNTER — Other Ambulatory Visit: Payer: Self-pay

## 2023-02-16 DIAGNOSIS — I10 Essential (primary) hypertension: Secondary | ICD-10-CM

## 2023-02-16 MED ORDER — LABETALOL HCL 200 MG PO TABS: 200.0000 mg | ORAL_TABLET | Freq: Three times a day (TID) | ORAL | 1 refills | Status: DC

## 2023-02-16 NOTE — Telephone Encounter (Signed)
[facility] pharmacy faxed refill request for the following medications:    labetalol (NORMODYNE) 200 MG tablet   Please advise

## 2023-02-16 NOTE — Telephone Encounter (Signed)
Rx sent to pharmacy for 90 day supply with 1 refill   [facility] in [place]

## 2023-06-14 DIAGNOSIS — N261 Atrophy of kidney (terminal): Secondary | ICD-10-CM | POA: Diagnosis not present

## 2023-06-14 DIAGNOSIS — N1832 Chronic kidney disease, stage 3b: Secondary | ICD-10-CM | POA: Diagnosis not present

## 2023-06-14 DIAGNOSIS — I129 Hypertensive chronic kidney disease with stage 1 through stage 4 chronic kidney disease, or unspecified chronic kidney disease: Secondary | ICD-10-CM | POA: Diagnosis not present

## 2023-06-14 DIAGNOSIS — R600 Localized edema: Secondary | ICD-10-CM | POA: Diagnosis not present

## 2023-06-20 DIAGNOSIS — R600 Localized edema: Secondary | ICD-10-CM | POA: Diagnosis not present

## 2023-06-20 DIAGNOSIS — I129 Hypertensive chronic kidney disease with stage 1 through stage 4 chronic kidney disease, or unspecified chronic kidney disease: Secondary | ICD-10-CM | POA: Diagnosis not present

## 2023-06-20 DIAGNOSIS — I10 Essential (primary) hypertension: Secondary | ICD-10-CM | POA: Diagnosis not present

## 2023-06-20 DIAGNOSIS — N1330 Unspecified hydronephrosis: Secondary | ICD-10-CM | POA: Diagnosis not present

## 2023-06-20 DIAGNOSIS — N2581 Secondary hyperparathyroidism of renal origin: Secondary | ICD-10-CM | POA: Diagnosis not present

## 2023-06-20 DIAGNOSIS — N1832 Chronic kidney disease, stage 3b: Secondary | ICD-10-CM | POA: Diagnosis not present

## 2023-06-20 DIAGNOSIS — N261 Atrophy of kidney (terminal): Secondary | ICD-10-CM | POA: Diagnosis not present

## 2023-06-22 DIAGNOSIS — H04123 Dry eye syndrome of bilateral lacrimal glands: Secondary | ICD-10-CM | POA: Diagnosis not present

## 2023-06-22 DIAGNOSIS — H43812 Vitreous degeneration, left eye: Secondary | ICD-10-CM | POA: Diagnosis not present

## 2023-06-22 DIAGNOSIS — Z961 Presence of intraocular lens: Secondary | ICD-10-CM | POA: Diagnosis not present

## 2023-07-25 DIAGNOSIS — I340 Nonrheumatic mitral (valve) insufficiency: Secondary | ICD-10-CM | POA: Diagnosis not present

## 2023-07-25 DIAGNOSIS — I10 Essential (primary) hypertension: Secondary | ICD-10-CM | POA: Diagnosis not present

## 2023-07-25 DIAGNOSIS — I6523 Occlusion and stenosis of bilateral carotid arteries: Secondary | ICD-10-CM | POA: Diagnosis not present

## 2023-07-25 DIAGNOSIS — E7800 Pure hypercholesterolemia, unspecified: Secondary | ICD-10-CM | POA: Diagnosis not present

## 2023-07-25 DIAGNOSIS — I700 Atherosclerosis of aorta: Secondary | ICD-10-CM | POA: Diagnosis not present

## 2023-07-25 DIAGNOSIS — I071 Rheumatic tricuspid insufficiency: Secondary | ICD-10-CM | POA: Diagnosis not present

## 2023-07-31 ENCOUNTER — Ambulatory Visit: Payer: Medicare Other | Admitting: Family Medicine

## 2023-08-06 NOTE — Progress Notes (Unsigned)
      Established patient visit   Patient: Dawn [name]   DOB: 01-15-1934   89 y.o. Female  MRN: [id number] Visit Date: 08/07/2023  Today's healthcare provider: Ronnald Ramp, MD   No chief complaint on file.  Subjective       Discussed the use of AI scribe software for clinical note transcription with the patient, who gave verbal consent to proceed.  History of Present Illness             Past Medical History:  Diagnosis Date   Acid reflux    Arthritis    Back pain    Gastric polyp 2014   Hypertension    Stomach ulcer    Stroke [facility])     Medications: Outpatient Medications Prior to Visit  Medication Sig   acetaminophen (TYLENOL) 325 MG tablet Take 650 mg by mouth every 6 (six) hours as needed for mild pain.   Biotin 5000 MCG CAPS Take 5,000 mcg by mouth daily.   DULoxetine (CYMBALTA) 30 MG capsule Take 1 capsule (30 mg total) by mouth daily.   labetalol (NORMODYNE) 200 MG tablet Take 1 tablet (200 mg total) by mouth 3 (three) times daily.   Lysine 500 MG CAPS Take 1 capsule by mouth daily.   Magnesium 500 MG TABS Take by mouth.   Olmesartan-amLODIPine-HCTZ 40-10-25 MG TABS Take 1 tablet by mouth daily.   pantoprazole (PROTONIX) 20 MG tablet TAKE 1 TABLET(20 MG) BY MOUTH DAILY   simvastatin (ZOCOR) 20 MG tablet TAKE 1 TABLET BY MOUTH ONCE  DAILY   No facility-administered medications prior to visit.    Review of Systems  {Insert previous labs (optional):23779} {See past labs  Heme  Chem  Endocrine  Serology  Results Review (optional):1}   Objective    There were no vitals taken for this visit. {Insert last BP/Wt (optional):23777}{See vitals history (optional):1}    Physical Exam  ***  No results found for any visits on 08/07/23.  Assessment & Plan     Problem List Items Addressed This Visit       Cardiovascular and Mediastinum   Hypertension - Primary     Nervous and Auditory   Neuropathy    Assessment and Plan               No follow-ups on file.         Ronnald Ramp, MD  [facility] [phone] (phone) [phone] (fax)  [facility]

## 2023-08-07 ENCOUNTER — Ambulatory Visit (INDEPENDENT_AMBULATORY_CARE_PROVIDER_SITE_OTHER): Payer: Medicare Other | Admitting: Family Medicine

## 2023-08-07 ENCOUNTER — Encounter: Payer: Self-pay | Admitting: Family Medicine

## 2023-08-07 VITALS — BP 158/50 | HR 53 | Resp 20 | Ht 61.0 in | Wt 188.0 lb

## 2023-08-07 DIAGNOSIS — I10 Essential (primary) hypertension: Secondary | ICD-10-CM

## 2023-08-07 DIAGNOSIS — Z23 Encounter for immunization: Secondary | ICD-10-CM

## 2023-08-07 DIAGNOSIS — R5383 Other fatigue: Secondary | ICD-10-CM | POA: Diagnosis not present

## 2023-08-07 DIAGNOSIS — G629 Polyneuropathy, unspecified: Secondary | ICD-10-CM

## 2023-08-07 MED ORDER — AMLODIPINE-OLMESARTAN 10-40 MG PO TABS: 1.0000 | ORAL_TABLET | Freq: Every day | ORAL | 1 refills | Status: DC

## 2023-08-07 MED ORDER — HYDROCHLOROTHIAZIDE 12.5 MG PO TABS: 12.5000 mg | ORAL_TABLET | Freq: Every day | ORAL | 1 refills | Status: DC

## 2023-08-07 NOTE — Patient Instructions (Signed)
VISIT SUMMARY:  During today's visit, we discussed your recent feelings of unwellness, including elevated blood pressure, dizziness, and lightheadedness, especially after a recent fall. We also reviewed your concerns about low energy levels and your history of hypertension and chronic kidney disease.  YOUR PLAN:  -HYPERTENSION: Hypertension is high blood pressure, which can lead to serious health problems if not managed properly. Your blood pressure readings have been high, and you have been experiencing dizziness and lightheadedness. We will continue your current medications, olmesartan 40 mg and amlodipine 10 mg, but reduce hydrochlorothiazide to 12.5 mg to help manage these symptoms. We will also monitor your blood pressure closely and have ordered blood tests to check your overall health.  -CHRONIC KIDNEY DISEASE: Chronic kidney disease is a condition where the kidneys are damaged and cannot filter blood as well as they should. Your kidney function is being managed by your nephrologist, and recent tests show stable creatinine levels. We will continue to monitor your kidney function and vitamin B12 levels, as these can affect your energy levels.  -GENERAL HEALTH MAINTENANCE: General health maintenance includes routine check-ups and preventive measures to keep you healthy. You are due for a flu shot, which you agreed to receive today to help protect you from the flu this season.  INSTRUCTIONS:  Please follow up in 2 weeks to assess your response to the medication changes and review your lab results. If you experience any new or worsening symptoms, contact our office immediately.

## 2023-08-07 NOTE — Assessment & Plan Note (Signed)
Chronic hypertension with elevated systolic blood pressure (148 mmHg) and low diastolic blood pressure (50 mmHg), resulting in a wide pulse pressure. Symptoms include dizziness and lightheadedness, particularly when standing. Currently on olmesartan 40 mg, amlodipine 10 mg, and hydrochlorothiazide 25 mg. Discussed risks of current regimen, including potential for falls and dizziness, and benefits of adjusting medication to reduce symptoms. Anticipated outcome is improved blood pressure control and reduced dizziness, with close monitoring required. - Continue olmesartan 40 mg and amlodipine 10 mg - Reduce hydrochlorothiazide to 12.5 mg - Order complete metabolic panel, CBC, vitamin B12 level, and thyroid function tests - Follow up in 2 weeks to assess response to medication changes

## 2023-08-08 LAB — COMPREHENSIVE METABOLIC PANEL
ALT: 14 [IU]/L (ref 0–32)
AST: 16 [IU]/L (ref 0–40)
Albumin: 4.3 g/dL (ref 3.7–4.7)
Alkaline Phosphatase: 85 [IU]/L (ref 44–121)
BUN/Creatinine Ratio: 19 (ref 12–28)
BUN: 28 mg/dL — ABNORMAL HIGH (ref 8–27)
Bilirubin Total: 0.3 mg/dL (ref 0.0–1.2)
CO2: 22 mmol/L (ref 20–29)
Calcium: 9.7 mg/dL (ref 8.7–10.3)
Chloride: 96 mmol/L (ref 96–106)
Creatinine, Ser: 1.44 mg/dL — ABNORMAL HIGH (ref 0.57–1.00)
Globulin, Total: 2.1 g/dL (ref 1.5–4.5)
Glucose: 96 mg/dL (ref 70–99)
Potassium: 4.2 mmol/L (ref 3.5–5.2)
Sodium: 134 mmol/L (ref 134–144)
Total Protein: 6.4 g/dL (ref 6.0–8.5)
eGFR: 35 mL/min/{1.73_m2} — ABNORMAL LOW (ref >59)

## 2023-08-08 LAB — VITAMIN B12: Vitamin B-12: 454 pg/mL (ref 232–1245)

## 2023-08-08 LAB — TSH+T4F+T3FREE
Free T4: 1.47 ng/dL (ref 0.82–1.77)
T3, Free: 2.4 pg/mL (ref 2.0–4.4)
TSH: 1.57 u[IU]/mL (ref 0.450–4.500)

## 2023-08-08 LAB — CBC
Hematocrit: 36.4 % (ref 34.0–46.6)
Hemoglobin: 12.1 g/dL (ref 11.1–15.9)
MCH: 31.2 pg (ref 26.6–33.0)
MCHC: 33.2 g/dL (ref 31.5–35.7)
MCV: 94 fL (ref 79–97)
Platelets: 254 10*3/uL (ref 150–450)
RBC: 3.88 x10E6/uL (ref 3.77–5.28)
RDW: 12.8 % (ref 11.7–15.4)
WBC: 8.1 10*3/uL (ref 3.4–10.8)

## 2023-08-13 ENCOUNTER — Telehealth: Payer: Self-pay | Admitting: Family Medicine

## 2023-08-13 DIAGNOSIS — I10 Essential (primary) hypertension: Secondary | ICD-10-CM

## 2023-08-13 MED ORDER — OLMESARTAN MEDOXOMIL 20 MG PO TABS: 40.0000 mg | ORAL_TABLET | Freq: Every day | ORAL | 6 refills | Status: DC

## 2023-08-13 MED ORDER — AMLODIPINE BESYLATE 10 MG PO TABS: 10.0000 mg | ORAL_TABLET | Freq: Every day | ORAL | 1 refills | Status: DC

## 2023-08-13 NOTE — Telephone Encounter (Signed)
I notified patient that we would try this route to see if insurance coverage is improved. She will notify us if medication is still unaffordable.

## 2023-08-13 NOTE — Telephone Encounter (Signed)
New prescriptions for amlodipine 10mg  and benicar 20mg  (take 40mg  daily) sent to [facility] due to cost of $126 for amlodipine-olmesartan combination medication.   Ronnald Ramp, MD

## 2023-08-13 NOTE — Telephone Encounter (Signed)
I talked to patient in regards to her medications. She had recent changes to her BP meds at office visit. She calls with concerns of the new medications not being affordable.  The Amlodipine/Olmesartan has a copay of $126. The hydrochlorothiazide is covered at 100%. Pharmacy confirmed this information.  Patient is unable to fill the current Amlodipine/Olmesartan prescription. Can we try to send separately?

## 2023-08-13 NOTE — Telephone Encounter (Signed)
Pt is calling in requesting to speak to someone in the office regarding her medication amLODipine-olmesartan (AZOR) 10-40 MG tablet [[id number]] hydrochlorothiazide (HYDRODIURIL) 12.5 MG tablet [[id number]] Pt says she spoke with the pharmacy and needs to straighten something out with them. Pt did not give any additional details but is requesting someone give her a call.

## 2023-08-21 ENCOUNTER — Ambulatory Visit (INDEPENDENT_AMBULATORY_CARE_PROVIDER_SITE_OTHER): Payer: Medicare Other | Admitting: Family Medicine

## 2023-08-21 ENCOUNTER — Encounter: Payer: Self-pay | Admitting: Family Medicine

## 2023-08-21 VITALS — BP 144/52 | HR 57 | Resp 18 | Ht 61.0 in | Wt 187.0 lb

## 2023-08-21 DIAGNOSIS — I10 Essential (primary) hypertension: Secondary | ICD-10-CM | POA: Diagnosis not present

## 2023-08-21 DIAGNOSIS — E7800 Pure hypercholesterolemia, unspecified: Secondary | ICD-10-CM

## 2023-08-21 DIAGNOSIS — J3089 Other allergic rhinitis: Secondary | ICD-10-CM

## 2023-08-21 MED ORDER — OLMESARTAN-AMLODIPINE-HCTZ 40-10-25 MG PO TABS: 1.0000 | ORAL_TABLET | Freq: Every day | ORAL | Status: DC

## 2023-08-21 NOTE — Assessment & Plan Note (Signed)
Chronic hypertension managed with labetalol 200 mg TID, amlodipine 10 mg daily, hydrochlorothiazide 12.5 mg daily, and olmesartan 20 mg daily. Home BP remains elevated in the 150s. Office BP today is 144/52, improved from 158/50 at the last visit. Patient expresses concerns about medication costs, currently over $120, and difficulty affording them. Discussed switching to a local pharmacy for monthly fills to manage costs. No need to increase medication dosage as current BP is acceptable and further reduction may lead to hypotension. Patient prefers not to increase medication due to cost and potential side effects. -resume prior combination pill due to cost concerns on fixed income  - Continue current blood pressure medications: including olmesartan 40mg /hctz25mg /amlodipine 10mg  combo pill daily - Monitor blood pressure at home, especially if readings exceed 170/80 or if new symptoms arise - Follow up in a 3-4 months

## 2023-08-21 NOTE — Assessment & Plan Note (Signed)
Year-round allergic rhinitis with symptoms including headache and sneezing. Currently using Allegra with limited relief. Discussed trying cetirizine 10 mg daily and considering nasal sprays like Flonase or Astepro for additional symptom control. Patient prefers to try cetirizine first. - Switch from Allegra to cetirizine 10 mg daily - Consider using Flonase or Astepro nasal spray - Continue using AYR nasal saline for moisture

## 2023-08-21 NOTE — Assessment & Plan Note (Signed)
Well-managed on simvastatin 20 mg daily. Chronic  Well controlled  - Continue simvastatin 20 mg daily

## 2023-08-21 NOTE — Progress Notes (Signed)
Established patient visit   Patient: Dawn [name]   DOB: 1934/08/12   89 y.o. Female  MRN: [id number] Visit Date: 08/21/2023  Today's healthcare provider: Ronnald Ramp, MD   Chief Complaint  Patient presents with   Hypertension   Subjective       Discussed the use of AI scribe software for clinical note transcription with the patient, who gave verbal consent to proceed.  History of Present Illness   The patient is an 89 year old female with a history of hypertension, currently managed with labetalol 200mg  three times daily, amlodipine 10mg  daily, hydrochlorothiazide 12.5mg  daily, and olmesartan 20mg  daily. Despite this regimen, the patient's blood pressure readings at home have been persistently elevated in the 150s. The patient's blood pressure in the office today is 144/52, which is a decrease from the previous reading of 158/50. The patient has expressed concerns about the cost of her blood pressure medications, which exceed $120 per month, and she has indicated that she cannot afford this expense.  The patient has also been experiencing allergy symptoms, including headaches and sneezing, which she attributes to seasonal changes. She has been managing these symptoms with Allegra, but reports that it only provides intermittent relief. The patient has tried other over-the-counter allergy medications in the past, including cetirizine and Claritin, but has not found them to be consistently effective.  The patient has also been using a nasal saline spray, AYR, to manage dryness in her nasal passages, which she believes is caused by her gas heating system. She reports that she has been experiencing nasal bleeding, which she attributes to the dryness.  The patient's cholesterol is managed with simvastatin 20mg , and she is also on Cymbalta 30mg  for an unspecified condition. She takes Protonix 20mg  as needed for acid reflux, but not on a regular basis.  The patient's  blood pressure management has recently been complicated by financial constraints, and she has been struggling to afford her medications. She has been filling her prescriptions through a mail-order pharmacy, but the cost has become prohibitive. The patient has expressed a willingness to switch to a local pharmacy that would allow her to fill her prescriptions on a monthly basis to reduce the cost.  Despite these challenges, the patient's blood pressure has been relatively stable, and she has not reported any symptoms of hypertensive crisis, such as severe headaches or visual disturbances. The patient's goal is to maintain her blood pressure below 150/90, and she is willing to make changes to her medication regimen to achieve this goal.         Past Medical History:  Diagnosis Date   Acid reflux    Arthritis    Back pain    Gastric polyp 2014   Hypertension    Stomach ulcer    Stroke [facility])     Medications: Outpatient Medications Prior to Visit  Medication Sig   acetaminophen (TYLENOL) 325 MG tablet Take 650 mg by mouth every 6 (six) hours as needed for mild pain.   Biotin 5000 MCG CAPS Take 5,000 mcg by mouth daily.   DULoxetine (CYMBALTA) 30 MG capsule Take 1 capsule (30 mg total) by mouth daily.   labetalol (NORMODYNE) 200 MG tablet Take 1 tablet (200 mg total) by mouth 3 (three) times daily.   Lysine 500 MG CAPS Take 1 capsule by mouth daily.   Magnesium 500 MG TABS Take by mouth.   pantoprazole (PROTONIX) 20 MG tablet TAKE 1 TABLET(20 MG) BY MOUTH DAILY  simvastatin (ZOCOR) 20 MG tablet TAKE 1 TABLET BY MOUTH ONCE  DAILY   [DISCONTINUED] amLODipine (NORVASC) 10 MG tablet Take 1 tablet (10 mg total) by mouth daily. (Patient not taking: Reported on 08/21/2023)   [DISCONTINUED] hydrochlorothiazide (HYDRODIURIL) 12.5 MG tablet Take 1 tablet (12.5 mg total) by mouth daily. (Patient not taking: Reported on 08/21/2023)   [DISCONTINUED] olmesartan (BENICAR) 20 MG tablet Take 2 tablets (40  mg total) by mouth daily. (Patient not taking: Reported on 08/21/2023)   No facility-administered medications prior to visit.    Review of Systems  Last metabolic panel Lab Results  Component Value Date   GLUCOSE 96 08/07/2023   NA 134 08/07/2023   K 4.2 08/07/2023   CL 96 08/07/2023   CO2 22 08/07/2023   BUN 28 (H) 08/07/2023   CREATININE 1.44 (H) 08/07/2023   EGFR 35 (L) 08/07/2023   CALCIUM 9.7 08/07/2023   PHOS 3.3 07/02/2017   PROT 6.4 08/07/2023   ALBUMIN 4.3 08/07/2023   LABGLOB 2.1 08/07/2023   AGRATIO 1.7 01/12/2022   BILITOT 0.3 08/07/2023   ALKPHOS 85 08/07/2023   AST 16 08/07/2023   ALT 14 08/07/2023   ANIONGAP 11 05/16/2022        Objective    BP (!) 144/52   Pulse (!) 57   Resp 18   Ht 5\' 1"  (1.549 m)   Wt 187 lb (84.8 kg)   SpO2 97%   BMI 35.33 kg/m   BP Readings from Last 3 Encounters:  08/21/23 (!) 144/52  08/07/23 (!) 158/50  01/23/23 (!) 148/60   Wt Readings from Last 3 Encounters:  08/21/23 187 lb (84.8 kg)  08/07/23 188 lb (85.3 kg)  01/23/23 184 lb 3.2 oz (83.6 kg)        Physical Exam  General: Alert, no acute distress Cardio: Normal S1 and S2, RRR Pulm: CTAB, normal work of breathing Abdomen: Bowel sounds normal. Abdomen soft and non-tender.    No results found for any visits on 08/21/23.  Assessment & Plan     Problem List Items Addressed This Visit       Cardiovascular and Mediastinum   Hypertension - Primary    Chronic hypertension managed with labetalol 200 mg TID, amlodipine 10 mg daily, hydrochlorothiazide 12.5 mg daily, and olmesartan 20 mg daily. Home BP remains elevated in the 150s. Office BP today is 144/52, improved from 158/50 at the last visit. Patient expresses concerns about medication costs, currently over $120, and difficulty affording them. Discussed switching to a local pharmacy for monthly fills to manage costs. No need to increase medication dosage as current BP is acceptable and further reduction  may lead to hypotension. Patient prefers not to increase medication due to cost and potential side effects. -resume prior combination pill due to cost concerns on fixed income  - Continue current blood pressure medications: including olmesartan 40mg /hctz25mg /amlodipine 10mg  combo pill daily - Monitor blood pressure at home, especially if readings exceed 170/80 or if new symptoms arise - Follow up in a 3-4 months      Relevant Medications   Olmesartan-amLODIPine-HCTZ 40-10-25 MG TABS     Respiratory   Allergic rhinitis    Year-round allergic rhinitis with symptoms including headache and sneezing. Currently using Allegra with limited relief. Discussed trying cetirizine 10 mg daily and considering nasal sprays like Flonase or Astepro for additional symptom control. Patient prefers to try cetirizine first. - Switch from Allegra to cetirizine 10 mg daily - Consider using Flonase or Astepro  nasal spray - Continue using AYR nasal saline for moisture        Other   Hypercholesteremia    Well-managed on simvastatin 20 mg daily. Chronic  Well controlled  - Continue simvastatin 20 mg daily      Relevant Medications   Olmesartan-amLODIPine-HCTZ 40-10-25 MG TABS     Gastroesophageal Reflux Disease (GERD) Intermittent use of Protonix 20 mg as needed for acid reflux. Gastroenterologist advised not to use for more than two weeks at a time. - Use Protonix 20 mg as needed, not exceeding two weeks at a time -f/u with Gastroenterology as needed     Return in about 4 months (around 12/24/2023) for CHRONIC F/U.         Ronnald Ramp, MD  [facility] [phone] (phone) [phone] (fax)  [facility]

## 2023-09-05 ENCOUNTER — Other Ambulatory Visit: Payer: Self-pay | Admitting: Family Medicine

## 2023-09-05 DIAGNOSIS — I10 Essential (primary) hypertension: Secondary | ICD-10-CM

## 2023-09-21 ENCOUNTER — Other Ambulatory Visit: Payer: Self-pay | Admitting: Family Medicine

## 2023-09-21 DIAGNOSIS — G629 Polyneuropathy, unspecified: Secondary | ICD-10-CM

## 2023-11-09 ENCOUNTER — Other Ambulatory Visit: Payer: Self-pay | Admitting: Family Medicine

## 2023-11-12 NOTE — Telephone Encounter (Signed)
 Requested medications are due for refill today.  soon  Requested medications are on the active medications list.  yes  Last refill. 01/01/2023 #90 3 rf  Future visit scheduled.   yes  Notes to clinic.  Labs are expired.    Requested Prescriptions  Pending Prescriptions Disp Refills   simvastatin (ZOCOR) 20 MG tablet [Pharmacy Med Name: Simvastatin 20 MG Oral Tablet] 90 tablet 3    Sig: TAKE 1 TABLET BY MOUTH ONCE  DAILY     Cardiovascular:  Antilipid - Statins Failed - 11/12/2023  1:33 PM      Failed - Lipid Panel in normal range within the last 12 months    Cholesterol, Total  Date Value Ref Range Status  03/31/2021 158 100 - 199 mg/dL Final   Cholesterol  Date Value Ref Range Status  06/08/2013 167 0 - 200 mg/dL Final   Ldl Cholesterol, Calc  Date Value Ref Range Status  06/08/2013 90 0 - 100 mg/dL Final   LDL Chol Calc (NIH)  Date Value Ref Range Status  03/31/2021 77 0 - 99 mg/dL Final   HDL Cholesterol  Date Value Ref Range Status  06/08/2013 41 40 - 60 mg/dL Final   HDL  Date Value Ref Range Status  03/31/2021 57 >39 mg/dL Final   Triglycerides  Date Value Ref Range Status  03/31/2021 141 0 - 149 mg/dL Final  96/12/5407 811 0 - 200 mg/dL Final         Passed - Patient is not pregnant      Passed - Valid encounter within last 12 months    Recent Outpatient Visits           2 months ago Primary hypertension   [place] [facility] Simmons-Robinson, [place], MD   3 months ago Primary hypertension   [place] [facility] Simmons-Robinson, [place], MD   9 months ago Neuropathy   [place] [facility] Simmons-Robinson, [place], MD   1 year ago Primary hypertension   [place] [facility] Simmons-Robinson, [place], MD   1 year ago Accelerated essential hypertension   [place] [facility] Simmons-Robinson, [place], MD       Future Appointments             In 1  month Simmons-Robinson, Tawanna Cooler, MD [facility]

## 2023-11-13 NOTE — Telephone Encounter (Signed)
 LOV 08/21/2023 NOV 12/19/2023 LRF 01/01/2023 LABS 11/26/

## 2023-11-14 ENCOUNTER — Other Ambulatory Visit: Payer: Self-pay | Admitting: Family Medicine

## 2023-11-14 DIAGNOSIS — I10 Essential (primary) hypertension: Secondary | ICD-10-CM

## 2023-11-14 NOTE — Telephone Encounter (Signed)
 Copied from CRM [id number]. Topic: Clinical - Medication Refill >> Nov 14, 2023  9:48 AM Patsy Lager T wrote: Most Recent Primary Care Visit:  Provider: Ronnald Ramp  Department: [id number]  Visit Type: OFFICE VISIT  Date: 08/21/2023  Medication: Olmesartan-amLODIPine-HCTZ 40-10-25 MG TABS   Has the patient contacted their pharmacy? Yes  Is this the correct pharmacy for this prescription? Yes  This is the patient's preferred pharmacy:   [facility] [place], [place] - [address] [place] [address] Phone: [phone] Fax: [phone]  Has the prescription been filled recently? Yes  Is the patient out of the medication? Yes  Has the patient been seen for an appointment in the last year OR does the patient have an upcoming appointment? Yes  Can we respond through MyChart? Yes  Agent: Please be advised that Rx refills may take up to 3 business days. We ask that you follow-up with your pharmacy.

## 2023-11-15 ENCOUNTER — Telehealth: Payer: Self-pay

## 2023-11-15 DIAGNOSIS — I10 Essential (primary) hypertension: Secondary | ICD-10-CM

## 2023-11-15 MED ORDER — OLMESARTAN-AMLODIPINE-HCTZ 40-10-25 MG PO TABS: 1.0000 | ORAL_TABLET | Freq: Every day | ORAL | 0 refills | Status: DC

## 2023-11-15 MED ORDER — OLMESARTAN-AMLODIPINE-HCTZ 40-10-25 MG PO TABS: 1.0000 | ORAL_TABLET | Freq: Every day | ORAL | 1 refills | Status: DC

## 2023-11-15 NOTE — Telephone Encounter (Signed)
 Copied from CRM [id number]. Topic: Clinical - Prescription Issue >> Nov 15, 2023  8:58 AM Gaetano Hawthorne wrote: Reason for CRM: Patient's prescription for Olmesartan-amLODIPine-HCTZ 40-10-25 MG TABS was sent to the [facility] pharmacy - it needs to be sent over to her mail-order pharmacy on file - Please see pharmacy information below - Please send ASAP as the patient only has pills until Sunday:  [facility] - [place] - [address]  [address]  [address]  [place] [address]  Phone: [phone] Fax: [phone]

## 2023-11-15 NOTE — Telephone Encounter (Signed)
 Labs in date.  Requested Prescriptions  Pending Prescriptions Disp Refills   Olmesartan-amLODIPine-HCTZ 40-10-25 MG TABS 90 tablet 0    Sig: Take 1 tablet by mouth daily.     Cardiovascular: CCB + ARB + Diuretic Combos Failed - 11/15/2023  8:25 AM      Failed - Cr in normal range and within 180 days    Creat  Date Value Ref Range Status  07/02/2017 1.38 (H) 0.60 - 0.88 mg/dL Final    Comment:    For patients >88 years of age, the reference limit for Creatinine is approximately 13% higher for people identified as African-American. .    Creatinine, Ser  Date Value Ref Range Status  08/07/2023 1.44 (H) 0.57 - 1.00 mg/dL Final         Failed - Last BP in normal range    BP Readings from Last 1 Encounters:  08/21/23 (!) 144/52         Passed - K in normal range and within 180 days    Potassium  Date Value Ref Range Status  08/07/2023 4.2 3.5 - 5.2 mmol/L Final  12/24/2013 3.4 (L) 3.5 - 5.1 mmol/L Final         Passed - Na in normal range and within 180 days    Sodium  Date Value Ref Range Status  08/07/2023 134 134 - 144 mmol/L Final  12/20/2013 134 (L) 136 - 145 mmol/L Final         Passed - eGFR is 10 or above and within 180 days    EGFR (African American)  Date Value Ref Range Status  12/24/2013 >60  Final   GFR calc Af Amer  Date Value Ref Range Status  01/06/2020 43 (L) >59 mL/min/1.73 Final    Comment:    **[facility] currently reports eGFR in compliance with the current**   recommendations of the [employer]. [facility] will   update reporting as new guidelines are published from the NKF-ASN   Task force.    EGFR (Non-African Amer.)  Date Value Ref Range Status  12/24/2013 >60  Final    Comment:    eGFR values <82mL/min/1.73 m2 may be an indication of chronic kidney disease (CKD). Calculated eGFR is useful in patients with stable renal function. The eGFR calculation will not be reliable in acutely ill patients when serum creatinine is  changing rapidly. It is not useful in  patients on dialysis. The eGFR calculation may not be applicable to patients at the low and high extremes of body sizes, pregnant women, and vegetarians.    GFR, Estimated  Date Value Ref Range Status  05/16/2022 45 (L) >60 mL/min Final    Comment:    (NOTE) Calculated using the CKD-EPI Creatinine Equation (2021)    eGFR  Date Value Ref Range Status  08/07/2023 35 (L) >59 mL/min/1.73 Final         Passed - Patient is not pregnant      Passed - Last Heart Rate in normal range    Pulse Readings from Last 1 Encounters:  08/21/23 (!) 57         Passed - Valid encounter within last 6 months    Recent Outpatient Visits           2 months ago Primary hypertension   [place] [facility] [place], [place], MD   3 months ago Primary hypertension   [place] [facility] Craig, [place], MD   9 months ago Neuropathy  [place] [facility] Simmons-Robinson, [place], MD   1 year ago Primary hypertension   [place] [facility] [place], MD   1 year ago Accelerated essential hypertension   [place] [facility] Simmons-Robinson, Tawanna Cooler, MD       Future Appointments             In 1 month Simmons-Robinson, Tawanna Cooler, MD [facility], [place]

## 2023-11-15 NOTE — Telephone Encounter (Signed)
 Prescription was resent to the correct pharmacy.

## 2023-11-20 ENCOUNTER — Ambulatory Visit: Payer: Self-pay | Admitting: Family Medicine

## 2023-11-20 ENCOUNTER — Telehealth: Payer: Self-pay

## 2023-11-20 NOTE — Telephone Encounter (Signed)
 Called and spoke to the pt, the medication is in processing and the pt is aware.

## 2023-11-20 NOTE — Telephone Encounter (Signed)
 Called and spoke to Dawn [name] with Mail order pharmacy, she has confirmed the requested medication is in processing and should be mailed within the next day. Called and was able to inform the pt about the medication, she verbally stated she understood and had no other questions

## 2023-11-20 NOTE — Telephone Encounter (Signed)
 Info only call: Pt calling needing BP medication. Her pharmacy states they've been waiting on orders. Office shows order was sent on 3/6. CAL notified and will send again. Pt denies any symptoms although she she has been without medication for a week. RN advised pt to call back if she starts to feel bad. PT verbalized understanding.       Copied from CRM [id number]. Topic: Clinical - Red Word Triage >> Nov 20, 2023 11:00 AM DeAngela L wrote: Red Word that prompted transfer to Nurse Triage: Patient medication refill sent to wrong pharm and now corrected but the patient is out of the blood pressure meds   Olmesartan-amLODIPine-HCTZ 40-10-25 MG TABS Reason for Disposition  Health Information question, no triage required and triager able to answer question  Answer Assessment - Initial Assessment Questions 1. REASON FOR CALL or QUESTION: "What is your reason for calling today?" or "How can I best help you?" or "What question do you have that I can help answer?"     Pt needs medication refilled  Protocols used: Information Only Call - No Triage-A-AH

## 2023-11-20 NOTE — Telephone Encounter (Signed)
 Copied from CRM [id number]. Topic: Clinical - Prescription Issue >> Nov 19, 2023 11:11 AM Elle L wrote: Reason for CRM: The patient is requesting to speak to [facility] regarding her Olmesartan-amLODIPine-HCTZ 40-10-25 MG TABS. I advised the patient that it was sent on 3/6 to [facility]. However, she states she spoke to the Pharmacy and they have not received it. I attempted to transfer her per her request however they were assisting other patients at this time as there was no answer. The patient's call back number is [phone].

## 2023-12-17 DIAGNOSIS — I10 Essential (primary) hypertension: Secondary | ICD-10-CM | POA: Diagnosis not present

## 2023-12-17 DIAGNOSIS — N1330 Unspecified hydronephrosis: Secondary | ICD-10-CM | POA: Diagnosis not present

## 2023-12-17 DIAGNOSIS — I129 Hypertensive chronic kidney disease with stage 1 through stage 4 chronic kidney disease, or unspecified chronic kidney disease: Secondary | ICD-10-CM | POA: Diagnosis not present

## 2023-12-17 DIAGNOSIS — N1832 Chronic kidney disease, stage 3b: Secondary | ICD-10-CM | POA: Diagnosis not present

## 2023-12-17 DIAGNOSIS — R600 Localized edema: Secondary | ICD-10-CM | POA: Diagnosis not present

## 2023-12-17 DIAGNOSIS — N261 Atrophy of kidney (terminal): Secondary | ICD-10-CM | POA: Diagnosis not present

## 2023-12-17 DIAGNOSIS — N2581 Secondary hyperparathyroidism of renal origin: Secondary | ICD-10-CM | POA: Diagnosis not present

## 2023-12-19 ENCOUNTER — Ambulatory Visit: Payer: Self-pay | Admitting: Family Medicine

## 2023-12-19 DIAGNOSIS — N261 Atrophy of kidney (terminal): Secondary | ICD-10-CM | POA: Diagnosis not present

## 2023-12-19 DIAGNOSIS — N1330 Unspecified hydronephrosis: Secondary | ICD-10-CM | POA: Diagnosis not present

## 2023-12-19 DIAGNOSIS — R600 Localized edema: Secondary | ICD-10-CM | POA: Diagnosis not present

## 2023-12-19 DIAGNOSIS — N1832 Chronic kidney disease, stage 3b: Secondary | ICD-10-CM | POA: Diagnosis not present

## 2023-12-19 DIAGNOSIS — N2581 Secondary hyperparathyroidism of renal origin: Secondary | ICD-10-CM | POA: Diagnosis not present

## 2023-12-19 DIAGNOSIS — I129 Hypertensive chronic kidney disease with stage 1 through stage 4 chronic kidney disease, or unspecified chronic kidney disease: Secondary | ICD-10-CM | POA: Diagnosis not present

## 2023-12-19 DIAGNOSIS — I10 Essential (primary) hypertension: Secondary | ICD-10-CM | POA: Diagnosis not present

## 2024-01-09 ENCOUNTER — Ambulatory Visit: Payer: Medicare Other

## 2024-01-09 DIAGNOSIS — Z0000 Encounter for general adult medical examination without abnormal findings: Secondary | ICD-10-CM | POA: Diagnosis not present

## 2024-01-09 NOTE — Progress Notes (Signed)
 Subjective:   Dawn [name] is a 89 y.o. who presents for a Medicare Wellness preventive visit.  Visit Complete: Virtual I connected with  Doyal Genera on 01/09/24 by a audio enabled telemedicine application and verified that I am speaking with the correct person using two identifiers.  Patient Location: Home  Provider Location: Home Office  I discussed the limitations of evaluation and management by telemedicine. The patient expressed understanding and agreed to proceed.  Vital Signs: Because this visit was a virtual/telehealth visit, some criteria may be missing or patient reported. Any vitals not documented were not able to be obtained and vitals that have been documented are patient reported.  VideoDeclined- This patient declined Librarian, academic. Therefore the visit was completed with audio only.  Persons Participating in Visit: Patient.  AWV Questionnaire: No: Patient Medicare AWV questionnaire was not completed prior to this visit.  Cardiac Risk Factors include: advanced age (>92men, >45 women);hypertension;dyslipidemia;sedentary lifestyle;obesity (BMI >30kg/m2)     Objective:    Today's Vitals   01/09/24 1344  PainSc: 4    There is no height or weight on file to calculate BMI.     01/09/2024    1:49 PM 12/19/2022    2:49 PM 05/16/2022   11:34 AM 12/13/2021    1:52 PM 01/15/2021    2:23 PM 07/13/2020    2:45 PM 07/08/2019    2:34 PM  Advanced Directives  Does Patient Have a Medical Advance Directive? No No No No No No No  Would patient like information on creating a medical advance directive? No - Patient declined   No - Patient declined  No - Patient declined Yes (ED - Information included in AVS)    Current Medications (verified) Outpatient Encounter Medications as of 01/09/2024  Medication Sig   acetaminophen  (TYLENOL ) 325 MG tablet Take 650 mg by mouth every 6 (six) hours as needed for mild pain.   Biotin 5000 MCG CAPS Take  5,000 mcg by mouth daily.   labetalol  (NORMODYNE ) 200 MG tablet TAKE 1 TABLET BY MOUTH 3 TIMES  DAILY   Lysine 500 MG CAPS Take 1 capsule by mouth daily.   Magnesium 500 MG TABS Take by mouth.   Olmesartan -amLODIPine -HCTZ 40-10-25 MG TABS Take 1 tablet by mouth daily.   pantoprazole  (PROTONIX ) 20 MG tablet TAKE 1 TABLET(20 MG) BY MOUTH DAILY   simvastatin  (ZOCOR ) 20 MG tablet TAKE 1 TABLET BY MOUTH ONCE  DAILY   DULoxetine  (CYMBALTA ) 30 MG capsule TAKE 1 CAPSULE BY MOUTH DAILY (Patient not taking: Reported on 01/09/2024)   No facility-administered encounter medications on file as of 01/09/2024.    Allergies (verified) Quinine, Levofloxacin , Statins, Celecoxib, and Codeine   History: Past Medical History:  Diagnosis Date   Acid reflux    Arthritis    Back pain    Gastric polyp 2014   Hypertension    Stomach ulcer    Stroke [facility])    Past Surgical History:  Procedure Laterality Date   BACK SURGERY  2011   vertebroplasty    CARDIAC CATHETERIZATION  2007   normal, [employer] in [place]     CARPAL TUNNEL RELEASE  2007   cataract surgery   2013   COLON SURGERY  2015   lap sigmoid resection   COLONOSCOPY  2011, 2015   Dr. Bradford Cadet, Dr Tia Flowers   LAMINECTOMY  2011   decompression    UPPER GI ENDOSCOPY  2011   VASCULAR SURGERY Right 2010   right  rental artery    Family History  Problem Relation Age of Onset   Cancer Father        stomach   Emphysema Father    Hypertension Mother    Atrial fibrillation Mother    Diabetes Sister    Hypertension Sister    Skin cancer Sister    Hypertension Brother    Heart disease Brother    Heart disease Sister    Colon polyps Sister    Hypertension Sister    Thyroid  disease Sister    Irritable bowel syndrome Sister    Breast cancer Sister    Cancer Sister        breast   Heart disease Brother    Colon polyps Brother    Cancer Brother        stomach   Diabetes Brother    Hypertension Brother    Neuropathy Brother     Post-traumatic stress disorder Brother    Hypertension Brother    Pneumonia Brother    Breast cancer Paternal Aunt    Breast cancer Paternal Grandmother    Breast cancer Paternal Aunt    Breast cancer Paternal Aunt    Breast cancer Paternal Aunt    Breast cancer Other    Social History   Socioeconomic History   Marital status: Divorced    Spouse name: none   Number of children: 1   Years of education: 13   Highest education level: Some college, no degree  Occupational History   Occupation: retired  Tobacco Use   Smoking status: Never   Smokeless tobacco: Never  Vaping Use   Vaping status: Never Used  Substance and Sexual Activity   Alcohol use: No   Drug use: No   Sexual activity: Never  Other Topics Concern   Not on file  Social History Narrative   Not on file   Social Drivers of Health   Financial Resource Strain: Low Risk  (01/09/2024)   Overall Financial Resource Strain (CARDIA)    Difficulty of Paying Living Expenses: Not hard at all  Food Insecurity: No Food Insecurity (01/09/2024)   Hunger Vital Sign    Worried About Running Out of Food in the Last Year: Never true    Ran Out of Food in the Last Year: Never true  Transportation Needs: No Transportation Needs (01/09/2024)   PRAPARE - Administrator, Civil Service (Medical): No    Lack of Transportation (Non-Medical): No  Physical Activity: Inactive (01/09/2024)   Exercise Vital Sign    Days of Exercise per Week: 0 days    Minutes of Exercise per Session: 0 min  Stress: No Stress Concern Present (01/09/2024)   [employer] of Occupational Health - Occupational Stress Questionnaire    Feeling of Stress : Not at all  Social Connections: Socially Isolated (01/09/2024)   Social Connection and Isolation Panel [NHANES]    Frequency of Communication with Friends and Family: More than three times a week    Frequency of Social Gatherings with Friends and Family: Once a week    Attends Religious  Services: Never    Database administrator or Organizations: No    Attends Engineer, structural: Never    Marital Status: Divorced    Tobacco Counseling Counseling given: Not Answered    Clinical Intake:  Pre-visit preparation completed: Yes  Pain : 0-10 Pain Score: 4  Pain Type: Chronic pain Pain Location: Back Pain Orientation: Lower Pain Descriptors / Indicators: Aching,  Discomfort, Constant Pain Onset: More than a month ago Pain Frequency: Constant     BMI - recorded: 35.3 Nutritional Status: BMI > 30  Obese Nutritional Risks: None Diabetes: No  Lab Results  Component Value Date   HGBA1C 5.4 01/12/2022   HGBA1C 5.7 (H) 08/15/2021   HGBA1C 5.4 09/21/2016     How often do you need to have someone help you when you read instructions, pamphlets, or other written materials from your doctor or pharmacy?: 1 - Never  Interpreter Needed?: No  Information entered by :: Dellie Fergusson, LPN   Activities of Daily Living    01/09/2024    1:50 PM 01/23/2023    3:36 PM  In your present state of health, do you have any difficulty performing the following activities:  Hearing? 0 1  Vision? 0 0  Difficulty concentrating or making decisions? 0 0  Walking or climbing stairs? 1 1  Dressing or bathing? 0 1  Doing errands, shopping? 0 0  Preparing Food and eating ? N   Using the Toilet? N   In the past six months, have you accidently leaked urine? Y   Do you have problems with loss of bowel control? N   Managing your Medications? N   Managing your Finances? N   Housekeeping or managing your Housekeeping? N     Patient Care Team: Mimi Alt, MD as PCP - General (Family Medicine) Annell Kidney, MD as Referring Physician (Ophthalmology) Rica Chalet, MD (Nephrology) Michelle Aid, MD as Consulting Physician (Cardiology)  Indicate any recent Medical Services you may have received from other than [facility] providers in the past year (date may  be approximate).     Assessment:   This is a routine wellness examination for Dawn [name].  Hearing/Vision screen Hearing Screening - Comments:: NO AIDS Vision Screening - Comments:: WEARS GLASSES ALL DAY- DR.BRASINGTON- HAD APPT IN OCTOBER 2024   Goals Addressed             This Visit's Progress    DIET - INCREASE WATER INTAKE         Depression Screen     01/09/2024    1:47 PM 08/07/2023    2:34 PM 01/23/2023    3:36 PM 12/19/2022    2:46 PM 10/06/2022    2:09 PM 09/22/2022    2:55 PM 05/23/2022    2:29 PM  PHQ 2/9 Scores  PHQ - 2 Score 0 0 0 0 0 1 0  PHQ- 9 Score 0 4 3  1 2      Fall Risk     01/09/2024    1:50 PM 08/07/2023    2:34 PM 01/23/2023    3:36 PM 12/19/2022    2:43 PM 10/06/2022    2:10 PM  Fall Risk   Falls in the past year? 0 1 1 1 1   Number falls in past yr: 0 1 1 0 1  Injury with Fall? 0 1 1 0 1  Risk for fall due to : No Fall Risks  History of fall(s) No Fall Risks Impaired balance/gait  Follow up Falls prevention discussed;Falls evaluation completed  Falls evaluation completed Education provided;Falls prevention discussed Falls evaluation completed    MEDICARE RISK AT HOME:  Medicare Risk at Home Any stairs in or around the home?: Yes If so, are there any without handrails?: No Home free of loose throw rugs in walkways, pet beds, electrical cords, etc?: Yes Adequate lighting in your home to reduce risk of falls?: Yes  Life alert?: No Use of a cane, walker or w/c?: Yes (CANE IN HOME; WALKER WHEN OUT) Grab bars in the bathroom?: Yes Shower chair or bench in shower?: No Elevated toilet seat or a handicapped toilet?: Yes  TIMED UP AND GO:  Was the test performed?  No  Cognitive Function: 6CIT completed        01/09/2024    1:52 PM 12/19/2022    2:52 PM 07/07/2020    2:25 PM 06/28/2017    2:57 PM  6CIT Screen  What Year? 0 points 0 points 0 points 0 points  What month? 0 points 0 points 0 points 0 points  What time? 0 points 0 points 0 points  0 points  Count back from 20 2 points 0 points 0 points 0 points  Months in reverse 0 points 0 points 0 points 0 points  Repeat phrase 0 points 0 points 0 points 2 points  Total Score 2 points 0 points 0 points 2 points    Immunizations Immunization History  Administered Date(s) Administered   Fluad Quad(high Dose 65+) 06/11/2019, 07/07/2020, 08/15/2021, 06/13/2022   Fluad Trivalent(High Dose 65+) 08/07/2023   Influenza, High Dose Seasonal PF 06/16/2015, 06/29/2016, 06/28/2017, 07/02/2018   Influenza,inj,quad, With Preservative 09/11/2016   Moderna Sars-Covid-2 Vaccination 01/17/2020, 02/15/2020   Pneumococcal Conjugate-13 02/17/2015   Pneumococcal Polysaccharide-23 08/14/2012   Pneumococcal-Unspecified 09/11/2016    Screening Tests Health Maintenance  Topic Date Due   DTaP/Tdap/Td (1 - Tdap) Never done   Zoster Vaccines- Shingrix (1 of 2) Never done   COVID-19 Vaccine (3 - Moderna risk series) 03/14/2020   INFLUENZA VACCINE  04/11/2024   Medicare Annual Wellness (AWV)  01/08/2025   Pneumonia Vaccine 17+ Years old  Completed   DEXA SCAN  Completed   HPV VACCINES  Aged Out   Meningococcal B Vaccine  Aged Out    Health Maintenance  Health Maintenance Due  Topic Date Due   DTaP/Tdap/Td (1 - Tdap) Never done   Zoster Vaccines- Shingrix (1 of 2) Never done   COVID-19 Vaccine (3 - Moderna risk series) 03/14/2020   Health Maintenance Items Addressed: UP TO DATE W/ SHOTS EXCEPT SHINGRIX & TDAP; WANTS NO MORE COVID SHOTS  Additional Screening:  Vision Screening: Recommended annual ophthalmology exams for early detection of glaucoma and other disorders of the eye.  Dental Screening: Recommended annual dental exams for proper oral hygiene  Community Resource Referral / Chronic Care Management: CRR required this visit?  No   CCM required this visit?  No     Plan:     I have personally reviewed and noted the following in the patient's chart:   Medical and social  history Use of alcohol, tobacco or illicit drugs  Current medications and supplements including opioid prescriptions. Patient is not currently taking opioid prescriptions. Functional ability and status Nutritional status Physical activity Advanced directives List of other physicians Hospitalizations, surgeries, and ER visits in previous 12 months Vitals Screenings to include cognitive, depression, and falls Referrals and appointments  In addition, I have reviewed and discussed with patient certain preventive protocols, quality metrics, and best practice recommendations. A written personalized care plan for preventive services as well as general preventive health recommendations were provided to patient.     Pinky Bright, LPN   1/61/0960   After Visit Summary: (MyChart) Due to this being a telephonic visit, the after visit summary with patients personalized plan was offered to patient via MyChart   Notes: Nothing significant to report  at this time.

## 2024-01-09 NOTE — Patient Instructions (Addendum)
 Dawn [name] , Thank you for taking time to come for your Medicare Wellness Visit. I appreciate your ongoing commitment to your health goals. Please review the following plan we discussed and let me know if I can assist you in the future.   Referrals/Orders/Follow-Ups/Clinician Recommendations: NONE  This is a list of the screening recommended for you and due dates:  Health Maintenance  Topic Date Due   DTaP/Tdap/Td vaccine (1 - Tdap) Never done   Zoster (Shingles) Vaccine (1 of 2) Never done   COVID-19 Vaccine (3 - Moderna risk series) 03/14/2020   Flu Shot  04/11/2024   Medicare Annual Wellness Visit  01/08/2025   Pneumonia Vaccine  Completed   DEXA scan (bone density measurement)  Completed   HPV Vaccine  Aged Out   Meningitis B Vaccine  Aged Out    Advanced directives: (ACP Link)Information on Advanced Care Planning can be found at [employer] Advance Health Care Directives Advance Health Care Directives. [email or web address]   Next Medicare Annual Wellness Visit scheduled for next year: Yes  01/14/25 @ 1:50 PM BY PHONE

## 2024-01-16 ENCOUNTER — Encounter: Payer: Self-pay | Admitting: Family Medicine

## 2024-01-16 ENCOUNTER — Ambulatory Visit (INDEPENDENT_AMBULATORY_CARE_PROVIDER_SITE_OTHER): Admitting: Family Medicine

## 2024-01-16 VITALS — BP 134/68 | HR 59 | Ht 61.0 in | Wt 189.0 lb

## 2024-01-16 DIAGNOSIS — K219 Gastro-esophageal reflux disease without esophagitis: Secondary | ICD-10-CM

## 2024-01-16 DIAGNOSIS — R5383 Other fatigue: Secondary | ICD-10-CM

## 2024-01-16 DIAGNOSIS — N1831 Chronic kidney disease, stage 3a: Secondary | ICD-10-CM

## 2024-01-16 DIAGNOSIS — I6523 Occlusion and stenosis of bilateral carotid arteries: Secondary | ICD-10-CM | POA: Diagnosis not present

## 2024-01-16 DIAGNOSIS — I340 Nonrheumatic mitral (valve) insufficiency: Secondary | ICD-10-CM

## 2024-01-16 DIAGNOSIS — I10 Essential (primary) hypertension: Secondary | ICD-10-CM

## 2024-01-16 DIAGNOSIS — G629 Polyneuropathy, unspecified: Secondary | ICD-10-CM | POA: Diagnosis not present

## 2024-01-16 DIAGNOSIS — E7800 Pure hypercholesterolemia, unspecified: Secondary | ICD-10-CM

## 2024-01-16 DIAGNOSIS — I071 Rheumatic tricuspid insufficiency: Secondary | ICD-10-CM

## 2024-01-16 NOTE — Assessment & Plan Note (Signed)
 Chronic hyperlipidemia managed with simvastatin  20 mg daily. - Continue simvastatin  20 mg daily.

## 2024-01-16 NOTE — Progress Notes (Signed)
 Established patient visit   Patient: Dawn [name]   DOB: 1934/04/27   89 y.o. Female  MRN: [id number] Visit Date: 01/16/2024  Today's healthcare provider: Mimi Alt, MD   Chief Complaint  Patient presents with   Hypertension    Bp recheck, bp has been in around 150/60   Subjective     HPI     Hypertension    Additional comments: Bp recheck, bp has been in around 150/60      Last edited by Bart Lieu, CMA on 01/16/2024  3:05 PM.       Discussed the use of AI scribe software for clinical note transcription with the patient, who gave verbal consent to proceed.  History of Present Illness Dawn [name] is an 89 year old female with chronic kidney disease stage three and hypertension who presents for chronic follow-up.  She experiences a lack of energy, feeling weak and shaky despite a desire to remain active. She attempts to engage in activities but struggles due to these symptoms. She is currently taking a multivitamin, magnesium, and vitamin D3.  She has a history of chronic kidney disease stage three, with a creatinine level of 1.37 noted during her last nephrology visit on April 9th. She is on olmesartan , aspirin , and a statin for management. She also has atrophy of her right kidney and chronic hydronephrosis, which has been evaluated by urology.  Her blood pressure was elevated at 176/50 in the office today, but she reports home blood pressures in the 150s. She is on multiple antihypertensive medications including olmesartan  40 mg, amlodipine  10 mg, hydrochlorothiazide  25 mg, and labetalol  200 mg three times daily. She follows with cardiology and is scheduled to see her in November.  She has a history of cerebral infarct and chronic knee arthritis. For chronic hyperlipidemia, she takes simvastatin  20 mg daily. For chronic reflux, she takes Protonix  20 mg daily. For mood, she takes Cymbalta  30 mg daily.  She eats too much and does not get  enough exercise due to back pain, which limits her ability to stay on her feet. She tries to do exercises while sitting, such as moving her legs and using light weights.  She mentions a past episode of low B12 levels for which she received weekly shots until her levels normalized. She has not been taking B12 supplements since then. She reports variable sleep quality, with some nights being restless without a clear reason.     Past Medical History:  Diagnosis Date   Acid reflux    Arthritis    Back pain    Gastric polyp 2014   Hypertension    Stomach ulcer    Stroke [facility])     Medications: Outpatient Medications Prior to Visit  Medication Sig   acetaminophen  (TYLENOL ) 325 MG tablet Take 650 mg by mouth every 6 (six) hours as needed for mild pain.   Biotin 5000 MCG CAPS Take 5,000 mcg by mouth daily.   labetalol  (NORMODYNE ) 200 MG tablet TAKE 1 TABLET BY MOUTH 3 TIMES  DAILY   Lysine 500 MG CAPS Take 1 capsule by mouth daily.   Magnesium 500 MG TABS Take by mouth.   Olmesartan -amLODIPine -HCTZ 40-10-25 MG TABS Take 1 tablet by mouth daily.   pantoprazole  (PROTONIX ) 20 MG tablet TAKE 1 TABLET(20 MG) BY MOUTH DAILY   simvastatin  (ZOCOR ) 20 MG tablet TAKE 1 TABLET BY MOUTH ONCE  DAILY   DULoxetine  (CYMBALTA ) 30 MG capsule TAKE 1 CAPSULE BY  MOUTH DAILY (Patient not taking: Reported on 01/16/2024)   No facility-administered medications prior to visit.    Review of Systems  Last CBC Lab Results  Component Value Date   WBC 8.1 08/07/2023   HGB 12.1 08/07/2023   HCT 36.4 08/07/2023   MCV 94 08/07/2023   MCH 31.2 08/07/2023   RDW 12.8 08/07/2023   PLT 254 08/07/2023   Last metabolic panel Lab Results  Component Value Date   GLUCOSE 96 08/07/2023   NA 134 08/07/2023   K 4.2 08/07/2023   CL 96 08/07/2023   CO2 22 08/07/2023   BUN 28 (H) 08/07/2023   CREATININE 1.44 (H) 08/07/2023   EGFR 35 (L) 08/07/2023   CALCIUM  9.7 08/07/2023   PHOS 3.3 07/02/2017   PROT 6.4 08/07/2023    ALBUMIN 4.3 08/07/2023   LABGLOB 2.1 08/07/2023   AGRATIO 1.7 01/12/2022   BILITOT 0.3 08/07/2023   ALKPHOS 85 08/07/2023   AST 16 08/07/2023   ALT 14 08/07/2023   ANIONGAP 11 05/16/2022   Last lipids Lab Results  Component Value Date   CHOL 158 03/31/2021   HDL 57 03/31/2021   LDLCALC 77 03/31/2021   TRIG 141 03/31/2021   CHOLHDL 2.8 03/31/2021   Last hemoglobin A1c Lab Results  Component Value Date   HGBA1C 5.4 01/12/2022   Last thyroid  functions Lab Results  Component Value Date   TSH 1.570 08/07/2023   Last vitamin D  No results found for: "25OHVITD2", "25OHVITD3", "VD25OH" Last vitamin B12 and Folate Lab Results  Component Value Date   VITAMINB12 454 08/07/2023   FOLATE 15.4 03/12/2019   Flowsheet Row Clinical Support from 01/09/2024 in [facility]  PHQ-9 Total Score 0            Objective    BP 134/68 (Cuff Size: Normal)   Pulse (!) 59   Ht 5\' 1"  (1.549 m)   Wt 189 lb (85.7 kg)   SpO2 99%   BMI 35.71 kg/m  BP Readings from Last 3 Encounters:  01/16/24 134/68  08/21/23 (!) 144/52  08/07/23 (!) 158/50   Wt Readings from Last 3 Encounters:  01/16/24 189 lb (85.7 kg)  08/21/23 187 lb (84.8 kg)  08/07/23 188 lb (85.3 kg)        Physical Exam  Physical Exam VITALS: BP- 134/68  General: Alert, no acute distress Cardio: Normal S1 and S2, RRR, murmur present Pulm: CTAB, normal work of breathing   No results found for any visits on 01/16/24.  Assessment & Plan     Problem List Items Addressed This Visit       Cardiovascular and Mediastinum   Mild tricuspid regurgitation   Mild mitral regurgitation   Chronic  Stable  Murmur present       Hypertension - Primary   Chronic  Hypertension with office blood pressure at 176/50 mmHg and home readings in the 150s. She is on olmesartan , amlodipine , hydrochlorothiazide , and labetalol . Labetalol  may contribute to fatigue due to its effect on heart rate. Nephrology  recommends continuing current medications. - Continue olmesartan , amlodipine , hydrochlorothiazide , and labetalol . - Recheck blood pressure in office. - Consult cardiology regarding potential tapering of labetalol  if fatigue persists.      Bilateral carotid artery stenosis     Digestive   GERD (gastroesophageal reflux disease)   Chronic, stable  Gastroesophageal reflux disease managed with Protonix  20 mg daily. - Continue Protonix  20 mg daily.        Nervous and Auditory  Neuropathy     Genitourinary   Chronic kidney disease, stage 3 unspecified (HCC)   Chronic kidney disease stage 3 with creatinine at 1.37 mg/dL. Nephrology advises avoiding nephrotoxic agents and IV contrast, and continuing current medications. She has right kidney atrophy and chronic hydronephrosis, previously evaluated by urology. - Avoid nephrotoxic agents and IV contrast. - Continue olmesartan , aspirin , and statin as per nephrology. - Monitor kidney function per nephrology follow-up.      Relevant Orders   CBC   VITAMIN D  25 Hydroxy (Vit-D Deficiency, Fractures)     Other   Hypercholesteremia   Chronic hyperlipidemia managed with simvastatin  20 mg daily. - Continue simvastatin  20 mg daily.      Other Visit Diagnoses       Decreased energy       Relevant Orders   CBC   VITAMIN D  25 Hydroxy (Vit-D Deficiency, Fractures)   TSH+T4F+T3Free   Vitamin B12       Assessment & Plan   Depression Chronic  Depression managed with Cymbalta  30 mg daily. Reports lack of energy, potentially due to medication side effects and possible vitamin deficiencies. Labetalol  may also contribute to fatigue. - Continue Cymbalta  30 mg daily. - Check vitamin B12, vitamin D , and thyroid  function tests. - Consider sleep study if fatigue persists despite adequate sleep.  Chronic Knee Arthritis Chronic knee arthritis causing mobility issues and back pain. Encouraged to perform chair exercises and use light weights to  maintain mobility and strength. - Encourage chair exercises and use of light weights.      Return in about 6 months (around 07/18/2024) for CHRONIC F/U.      Mimi Alt, MD  [facility] [phone] (phone) [phone] (fax)  [facility]

## 2024-01-16 NOTE — Assessment & Plan Note (Signed)
 Chronic  Hypertension with office blood pressure at 176/50 mmHg and home readings in the 150s. She is on olmesartan , amlodipine , hydrochlorothiazide , and labetalol . Labetalol  may contribute to fatigue due to its effect on heart rate. Nephrology recommends continuing current medications. - Continue olmesartan , amlodipine , hydrochlorothiazide , and labetalol . - Recheck blood pressure in office. - Consult cardiology regarding potential tapering of labetalol  if fatigue persists.

## 2024-01-16 NOTE — Patient Instructions (Addendum)
     VISIT SUMMARY: Today, you were seen for a follow-up visit to manage your chronic conditions, including chronic kidney disease, hypertension, depression, chronic knee arthritis, hyperlipidemia, and gastroesophageal reflux disease. We discussed your symptoms of fatigue, weakness, and shakiness, and reviewed your current medications and lifestyle habits.  YOUR PLAN: -HYPERTENSION: Hypertension means high blood pressure. Your blood pressure was elevated today at 176/50 mmHg, and you reported home readings in the 150s. You are currently taking olmesartan , amlodipine , hydrochlorothiazide , and labetalol . We will continue these medications and recheck your blood pressure in the office. We will also consult with cardiology about possibly reducing your labetalol  if your fatigue continues.  -CHRONIC KIDNEY DISEASE, STAGE 3: Chronic kidney disease stage 3 means your kidneys are moderately damaged and not working as well as they should. Your creatinine level is 1.37 mg/dL. You should avoid nephrotoxic agents and IV contrast. Continue taking olmesartan , aspirin , and your statin as advised by nephrology, and we will monitor your kidney function regularly.  -DEPRESSION: Depression is a mood disorder that causes persistent feelings of sadness and loss of interest. You are taking Cymbalta  30 mg daily. We will check your vitamin B12, vitamin D , and thyroid  function to see if deficiencies are contributing to your fatigue. If your fatigue continues despite adequate sleep, we may consider a sleep study.  -CHRONIC KNEE ARTHRITIS: Chronic knee arthritis is a condition that causes pain and stiffness in your knees. You are encouraged to perform chair exercises and use light weights to help maintain your mobility and strength.  -CHRONIC HYPERLIPIDEMIA: Chronic hyperlipidemia means you have high levels of fats (lipids) in your blood. You are taking simvastatin  20 mg daily to manage this condition. Continue with this  medication.  -GASTROESOPHAGEAL REFLUX DISEASE: Gastroesophageal reflux disease (GERD) is a condition where stomach acid frequently flows back into the tube connecting your mouth and stomach. You are taking Protonix  20 mg daily to manage this condition. Continue with this medication.  INSTRUCTIONS: Please follow up with cardiology in November as scheduled. Recheck your blood pressure in the office as advised. We will also check your vitamin B12, vitamin D , and thyroid  function tests. If your fatigue persists, we may consider a sleep study.                      Contains text generated by Abridge.                                 Contains text generated by Abridge.                       Contains text generated by Abridge.                                 Contains text generated by Abridge.

## 2024-01-16 NOTE — Assessment & Plan Note (Signed)
 Chronic  Stable  Murmur present

## 2024-01-16 NOTE — Assessment & Plan Note (Signed)
 Chronic kidney disease stage 3 with creatinine at 1.37 mg/dL. Nephrology advises avoiding nephrotoxic agents and IV contrast, and continuing current medications. She has right kidney atrophy and chronic hydronephrosis, previously evaluated by urology. - Avoid nephrotoxic agents and IV contrast. - Continue olmesartan , aspirin , and statin as per nephrology. - Monitor kidney function per nephrology follow-up.

## 2024-01-16 NOTE — Assessment & Plan Note (Signed)
 Chronic, stable  Gastroesophageal reflux disease managed with Protonix  20 mg daily. - Continue Protonix  20 mg daily.

## 2024-01-17 LAB — TSH+T4F+T3FREE
Free T4: 1.21 ng/dL (ref 0.82–1.77)
T3, Free: 2.3 pg/mL (ref 2.0–4.4)
TSH: 2.85 u[IU]/mL (ref 0.450–4.500)

## 2024-01-17 LAB — VITAMIN D 25 HYDROXY (VIT D DEFICIENCY, FRACTURES): Vit D, 25-Hydroxy: 28.4 ng/mL — ABNORMAL LOW (ref 30.0–100.0)

## 2024-01-17 LAB — CBC
Hematocrit: 38.7 % (ref 34.0–46.6)
Hemoglobin: 12.2 g/dL (ref 11.1–15.9)
MCH: 29 pg (ref 26.6–33.0)
MCHC: 31.5 g/dL (ref 31.5–35.7)
MCV: 92 fL (ref 79–97)
Platelets: 242 10*3/uL (ref 150–450)
RBC: 4.2 x10E6/uL (ref 3.77–5.28)
RDW: 13.1 % (ref 11.7–15.4)
WBC: 7.1 10*3/uL (ref 3.4–10.8)

## 2024-01-17 LAB — VITAMIN B12: Vitamin B-12: 365 pg/mL (ref 232–1245)

## 2024-01-18 ENCOUNTER — Encounter: Payer: Self-pay | Admitting: Family Medicine

## 2024-02-26 ENCOUNTER — Other Ambulatory Visit: Payer: Self-pay | Admitting: Family Medicine

## 2024-02-26 DIAGNOSIS — K219 Gastro-esophageal reflux disease without esophagitis: Secondary | ICD-10-CM

## 2024-02-26 NOTE — Telephone Encounter (Signed)
 LOV 5*7*25 NOV 11*11*25 LRF 6*5*23 LABS  5*7*25

## 2024-02-26 NOTE — Telephone Encounter (Signed)
 [facility] faxed refill request for the following medications:    pantoprazole  (PROTONIX ) 20 MG tablet   Please advise

## 2024-02-28 ENCOUNTER — Telehealth: Payer: Self-pay | Admitting: Family Medicine

## 2024-02-28 DIAGNOSIS — K219 Gastro-esophageal reflux disease without esophagitis: Secondary | ICD-10-CM

## 2024-02-28 MED ORDER — PANTOPRAZOLE SODIUM 20 MG PO TBEC
DELAYED_RELEASE_TABLET | ORAL | 3 refills | Status: AC
Start: 2024-02-28 — End: ?

## 2024-02-28 MED ORDER — PANTOPRAZOLE SODIUM 20 MG PO TBEC: DELAYED_RELEASE_TABLET | ORAL | 3 refills | Status: DC

## 2024-02-28 NOTE — Addendum Note (Signed)
 Addended by: Selinda Dales on: 02/28/2024 03:20 PM   Modules accepted: Orders

## 2024-02-28 NOTE — Telephone Encounter (Signed)
 [facility] is requesting refill pantoprazole  (PROTONIX ) 20 MG tablet  Please advise

## 2024-02-28 NOTE — Telephone Encounter (Signed)
 Called [facility] to cancel medication refill. Sent to correct pharmacy.

## 2024-02-28 NOTE — Telephone Encounter (Signed)
 Prescription filled and sent over to pharmacy.

## 2024-04-06 ENCOUNTER — Other Ambulatory Visit: Payer: Self-pay | Admitting: Family Medicine

## 2024-04-06 DIAGNOSIS — I10 Essential (primary) hypertension: Secondary | ICD-10-CM

## 2024-04-08 ENCOUNTER — Other Ambulatory Visit: Payer: Self-pay | Admitting: Family Medicine

## 2024-04-08 DIAGNOSIS — I10 Essential (primary) hypertension: Secondary | ICD-10-CM

## 2024-04-08 NOTE — Telephone Encounter (Signed)
 Too soon to refill.

## 2024-04-24 ENCOUNTER — Ambulatory Visit (INDEPENDENT_AMBULATORY_CARE_PROVIDER_SITE_OTHER): Admitting: Physician Assistant

## 2024-04-24 ENCOUNTER — Encounter: Payer: Self-pay | Admitting: Physician Assistant

## 2024-04-24 ENCOUNTER — Ambulatory Visit: Payer: Self-pay | Admitting: *Deleted

## 2024-04-24 VITALS — BP 160/44 | HR 64 | Ht 61.0 in | Wt 183.4 lb

## 2024-04-24 DIAGNOSIS — E559 Vitamin D deficiency, unspecified: Secondary | ICD-10-CM | POA: Diagnosis not present

## 2024-04-24 DIAGNOSIS — M791 Myalgia, unspecified site: Secondary | ICD-10-CM | POA: Diagnosis not present

## 2024-04-24 DIAGNOSIS — N1831 Chronic kidney disease, stage 3a: Secondary | ICD-10-CM

## 2024-04-24 DIAGNOSIS — Z8673 Personal history of transient ischemic attack (TIA), and cerebral infarction without residual deficits: Secondary | ICD-10-CM | POA: Diagnosis not present

## 2024-04-24 DIAGNOSIS — G4489 Other headache syndrome: Secondary | ICD-10-CM

## 2024-04-24 DIAGNOSIS — M7989 Other specified soft tissue disorders: Secondary | ICD-10-CM | POA: Diagnosis not present

## 2024-04-24 DIAGNOSIS — R0602 Shortness of breath: Secondary | ICD-10-CM

## 2024-04-24 DIAGNOSIS — H6123 Impacted cerumen, bilateral: Secondary | ICD-10-CM

## 2024-04-24 DIAGNOSIS — I10 Essential (primary) hypertension: Secondary | ICD-10-CM | POA: Diagnosis not present

## 2024-04-24 DIAGNOSIS — R5383 Other fatigue: Secondary | ICD-10-CM | POA: Diagnosis not present

## 2024-04-24 DIAGNOSIS — R42 Dizziness and giddiness: Secondary | ICD-10-CM

## 2024-04-24 NOTE — Telephone Encounter (Signed)
 FYI Only or Action Required?: FYI only for provider.  Patient was last seen in primary care on 01/16/2024 by Sharma Coyer, MD.  Called Nurse Triage reporting Dizziness (Weakness, dizziness).  Symptoms began several weeks ago.  Interventions attempted: Rest, hydration, or home remedies.  Symptoms are: gradually worsening.  Triage Disposition: See HCP Within 4 Hours (Or PCP Triage)  Patient/caregiver understands and will follow disposition?: Yes   Reason for Disposition  [1] MODERATE weakness (e.g., interferes with work, school, normal activities) AND [2] cause unknown  (Exceptions: Weakness from acute minor illness or poor fluid intake; weakness is chronic and not worse.)  Answer Assessment - Initial Assessment Questions 1. DESCRIPTION: Describe how you are feeling.     Weak and not feeling good 2. SEVERITY: How bad is it?  Can you stand and walk?     Unable to do activity without exhaustion, recent fall 3. ONSET: When did these symptoms begin? (e.g., hours, days, weeks, months)     2 weeks 4. CAUSE: What do you think is causing the weakness or fatigue? (e.g., not drinking enough fluids, medical problem, trouble sleeping)     Medical problem, not sleeping well 5. NEW MEDICINES:  Have you started on any new medicines recently? (e.g., opioid pain medicines, benzodiazepines, muscle relaxants, antidepressants, antihistamines, neuroleptics, beta blockers)     No changes 6. OTHER SYMPTOMS: Do you have any other symptoms? (e.g., chest pain, fever, cough, SOB, vomiting, diarrhea, bleeding, other areas of pain)     Inner ear pain  Protocols used: Weakness (Generalized) and Fatigue-A-AH   Copied from CRM #[id number]. Topic: Clinical - Red Word Triage >> Apr 24, 2024  8:08 AM Debby BROCKS wrote: Red Word that prompted transfer to Nurse Triage: weak and shaky and has been this way for 2 weeks, dizzy and light headed as well

## 2024-04-24 NOTE — Progress Notes (Signed)
 Established patient visit  Patient: Dawn [name]   DOB: 29-Jun-1934   89 y.o. Female  MRN: [id number] Visit Date: 04/24/2024  Today's healthcare provider: Jolynn Spencer, PA-C   Chief Complaint  Patient presents with  . Fatigue    Patient is present due to being fatigue, having lack of energy and dizziness X 2 weeks. Reports she is unable to do activity without exhaustion and recently experienced a fall. Also reports not sleeping well. No changes in her medications and also experiencing inner ear pain in right . She also reports headaches in the last week just about every day that sometimes last all day and other times comes and goes associated with some visual changes   Subjective     HPI     Fatigue    Additional comments: Patient is present due to being fatigue, having lack of energy and dizziness X 2 weeks. Reports she is unable to do activity without exhaustion and recently experienced a fall. Also reports not sleeping well. No changes in her medications and also experiencing inner ear pain in right . She also reports headaches in the last week just about every day that sometimes last all day and other times comes and goes associated with some visual changes      Last edited by Lilian Fitzpatrick, CMA on 04/24/2024  3:41 PM.       Discussed the use of AI scribe software for clinical note transcription with the patient, who gave verbal consent to proceed.  History of Present Illness Dawn [name] is an 89 year old female with hypertension who presents with fatigue, dizziness, and ear discomfort. She is accompanied by her daughter.  She has experienced fatigue, lack of energy, and dizziness for two weeks. Her ears feel 'stopped up,' and headaches began after the onset of ear pain and weakness. Dizziness occurs when changing positions, such as from sitting to standing, and she experiences weakness during activities like carrying dishes. Shortness of breath occurs with minimal  exertion. She takes olmesartan  and labetalol  for hypertension, with usual blood pressure readings around 150-160/59-61 mmHg. No recent changes in medication. Tingling occurs in her arm, previously affected by a stroke. Previous tests showed normal B12 levels decreasing over time and low vitamin D  levels. She takes vitamin D  daily but not B12. She has a non-functioning kidney and the other functioning at 34%, with nephrology follow-up due in November. She primarily stays at home, only going out for medical appointments or personal care.       01/09/2024    1:47 PM 08/07/2023    2:34 PM 01/23/2023    3:36 PM  Depression screen PHQ 2/9  Decreased Interest 0 0 0  Down, Depressed, Hopeless 0 0 0  PHQ - 2 Score 0 0 0  Altered sleeping 0 0 0  Tired, decreased energy 0 2 0  Change in appetite 0 0 0  Feeling bad or failure about yourself  0 0 0  Trouble concentrating 0 0 0  Moving slowly or fidgety/restless 0 2 3  Suicidal thoughts 0 0 0  PHQ-9 Score 0 4 3  Difficult doing work/chores Not difficult at all Somewhat difficult Very difficult      08/07/2023    2:34 PM  GAD 7 : Generalized Anxiety Score  Nervous, Anxious, on Edge 0  Control/stop worrying 0  Worry too much - different things 0  Trouble relaxing 0  Restless 0  Easily annoyed or irritable 0  Afraid - awful  might happen 0  Total GAD 7 Score 0  Anxiety Difficulty Not difficult at all    Medications: Outpatient Medications Prior to Visit  Medication Sig  . acetaminophen  (TYLENOL ) 325 MG tablet Take 650 mg by mouth every 6 (six) hours as needed for mild pain.  . Biotin 5000 MCG CAPS Take 5,000 mcg by mouth daily.  . labetalol  (NORMODYNE ) 200 MG tablet TAKE 1 TABLET BY MOUTH 3 TIMES  DAILY  . Lysine 500 MG CAPS Take 1 capsule by mouth daily.  . Magnesium 500 MG TABS Take by mouth.  . Olmesartan -amLODIPine -HCTZ 40-10-25 MG TABS Take 1 tablet by mouth daily.  . pantoprazole  (PROTONIX ) 20 MG tablet TAKE 1 TABLET(20 MG) BY MOUTH  DAILY  . simvastatin  (ZOCOR ) 20 MG tablet TAKE 1 TABLET BY MOUTH ONCE  DAILY  . DULoxetine  (CYMBALTA ) 30 MG capsule TAKE 1 CAPSULE BY MOUTH DAILY   No facility-administered medications prior to visit.    Review of Systems All negative Except see HPI   {Insert previous labs (optional):23779} {See past labs  Heme  Chem  Endocrine  Serology  Results Review (optional):1}   Objective    BP (!) 160/44 (BP Location: Left Arm, Patient Position: Sitting, Cuff Size: Large)   Pulse 64   Ht 5' 1 (1.549 m)   Wt 183 lb 6.4 oz (83.2 kg)   SpO2 99%   BMI 34.65 kg/m  {Insert last BP/Wt (optional):23777}{See vitals history (optional):1}   Physical Exam Vitals reviewed.  Constitutional:      General: She is not in acute distress.    Appearance: Normal appearance. She is well-developed. She is not diaphoretic.  HENT:     Head: Normocephalic and atraumatic.  Eyes:     General: No scleral icterus.    Conjunctiva/sclera: Conjunctivae normal.  Neck:     Thyroid : No thyromegaly.  Cardiovascular:     Rate and Rhythm: Normal rate and regular rhythm.     Pulses: Normal pulses.     Heart sounds: Normal heart sounds. No murmur heard. Pulmonary:     Effort: Pulmonary effort is normal. No respiratory distress.     Breath sounds: Normal breath sounds. No wheezing, rhonchi or rales.  Musculoskeletal:     Cervical back: Neck supple.     Right lower leg: No edema.     Left lower leg: No edema.  Lymphadenopathy:     Cervical: No cervical adenopathy.  Skin:    General: Skin is warm and dry.     Findings: No rash.  Neurological:     Mental Status: She is alert and oriented to person, place, and time. Mental status is at baseline.  Psychiatric:        Mood and Affect: Mood normal.        Behavior: Behavior normal.      No results found for any visits on 04/24/24.      Assessment & Plan Fatigue/Dizziness/Headache Orthostatic hypotension with dizziness, presyncope, fatigue, and  weakness Orthostatic hypotension confirmed with significant blood pressure drop upon standing. Differential includes dehydration and medication effects. - Encourage increased water intake. - Recheck blood pressure in one week. - Perform EKG. - Conduct blood work for kidney function. Ordered workup, see below Advised using compression stocking or abdominal binders, avoiding hot environments . Advised standing up slowly to help maintain blood pressure  Hypertension in the setting of chronic kidney disease Hypertension with chronic kidney disease. Blood pressure fluctuations noted. Concern about medication impact on kidney function. - Monitor  blood pressure closely. - Review medication regimen with primary care. Continue labetalol  200mg , olmesartan -amlodipine -hydrochlorothiazide  40-10-25mg  - Ensure adequate hydration. Will follow-up  Chronic kidney disease with reduced renal function Chronic kidney disease with significant reduction in renal function. Symptoms may relate to kidney status. Under nephrologist care. - Perform blood work for kidney function. - Ensure adequate hydration. - Avoid diuretics.  Lower leg swelling L>R possibly related to kidney function or blood pressure management. - Monitor edema. - Ensure adequate hydration. - Avoid excessive salt intake.  Shortness of breath on exertion Shortness of breath with exertion. Initial assessment does not indicate heart failure. - Perform EKG. - Monitor symptoms closely.  Hearing loss and cerumen impaction Hearing loss and ear blockage due to cerumen impaction. - Provide ear drops. - Refer to ENT if symptoms persist.  Vitamin D  deficiency Vitamin D  deficiency managed with supplements as prescribed by nephrologist. - Continue vitamin D  supplementation.  History of cerebrovascular accident with residual left arm symptoms Residual tingling in left arm post-cerebrovascular accident.  Myalgia possibly related to statin  use Bilateral arm pain possibly related to atorvastatin. Statin held, symptoms persist. - Continue to hold atorvastatin.  Primary hypertension   - CBC with Differential/Platelet - Comprehensive metabolic panel with GFR - Lipid panel - TSH - Vitamin B12 - Magnesium - D-Dimer, Quantitative - Urinalysis, Routine w reflex microscopic  Myalgia  - CK (Creatine Kinase)   8. Shortness of breath ***   10. Vitamin D  deficiency ***  Orders Placed This Encounter  Procedures  . CBC with Differential/Platelet  . Comprehensive metabolic panel with GFR    Has the patient fasted?:   Yes  . Lipid panel    Has the patient fasted?:   Yes  . TSH  . Vitamin B12  . Magnesium  . D-Dimer, Quantitative  . Urinalysis, Routine w reflex microscopic  . CK (Creatine Kinase)    No follow-ups on file.   The patient was advised to call back or seek an in-person evaluation if the symptoms worsen or if the condition fails to improve as anticipated.  I discussed the assessment and treatment plan with the patient. The patient was provided an opportunity to ask questions and all were answered. The patient agreed with the plan and demonstrated an understanding of the instructions.  I, Markeesha Char, PA-C have reviewed all documentation for this visit. The documentation on 04/24/2024  for the exam, diagnosis, procedures, and orders are all accurate and complete.  Jolynn Spencer, [facility], MMS [facility] [phone] (phone) [phone] (fax)  [facility]

## 2024-04-25 ENCOUNTER — Ambulatory Visit: Payer: Self-pay

## 2024-04-25 ENCOUNTER — Ambulatory Visit (INDEPENDENT_AMBULATORY_CARE_PROVIDER_SITE_OTHER): Admitting: Family Medicine

## 2024-04-25 ENCOUNTER — Encounter: Payer: Self-pay | Admitting: Family Medicine

## 2024-04-25 VITALS — BP 144/54 | HR 64 | Temp 97.9°F | Ht 61.0 in | Wt 184.5 lb

## 2024-04-25 DIAGNOSIS — N3945 Continuous leakage: Secondary | ICD-10-CM

## 2024-04-25 DIAGNOSIS — R42 Dizziness and giddiness: Secondary | ICD-10-CM

## 2024-04-25 DIAGNOSIS — I700 Atherosclerosis of aorta: Secondary | ICD-10-CM

## 2024-04-25 DIAGNOSIS — I951 Orthostatic hypotension: Secondary | ICD-10-CM

## 2024-04-25 DIAGNOSIS — H9203 Otalgia, bilateral: Secondary | ICD-10-CM

## 2024-04-25 DIAGNOSIS — I10 Essential (primary) hypertension: Secondary | ICD-10-CM | POA: Diagnosis not present

## 2024-04-25 MED ORDER — AMOXICILLIN-POT CLAVULANATE 875-125 MG PO TABS: 1.0000 | ORAL_TABLET | Freq: Two times a day (BID) | ORAL | 0 refills | Status: DC

## 2024-04-25 NOTE — Telephone Encounter (Signed)
 FYI Only or Action Required?: Action required by provider: request for appointment.  Patient was last seen in primary care on 01/16/2024 by Sharma Coyer, MD.  Called Nurse Triage reporting Ear Pain.  Symptoms began today.  Interventions attempted: Nothing.  Symptoms are: unchanged.  Triage Disposition: See HCP Within 4 Hours (Or PCP Triage)  Patient/caregiver understands and will follow disposition?: YesCopied from CRM #[id number]. Topic: Clinical - Red Word Triage >> Apr 25, 2024  1:09 PM Myrick T wrote: Red Word that prompted transfer to Nurse Triage: [name] stated when she was cleaning patients ears there was blood coming out with the wax. The right ear is red and worse than the left. Patient is complaining of pain Reason for Disposition  [1] SEVERE pain (e.g., excruciating) and [2] not improved 2 hours after pain medicine (e.g., acetaminophen  or ibuprofen)  Answer Assessment - Initial Assessment Questions Pt was Seen yesterday for dizziness/weakness. Provider unable to see in ears due to wax. Family cleaned wax out today and discovered wax had blood mixed in. Ears look real red.      1. LOCATION: Which ear is involved?     Both, right is worse 2. ONSET: When did the ear pain start?      Several weeks  Protocols used: Earache-A-AH

## 2024-04-25 NOTE — Progress Notes (Unsigned)
 Established patient visit   Patient: Dawn [name]   DOB: 1934/06/08   89 y.o. Female  MRN: [id number] Visit Date: 04/25/2024  Today's healthcare provider: LAURAINE LOISE BUOY, DO   Chief Complaint  Patient presents with   Ear Pain    Patient presents with ear pain, R ear. Patient's daughter got an OTC wax removal kit and got some wax out, states there seemed to be some blood mixed with the wax. Patient states both ears are feeling very full.    Subjective    HPI Dawn [name] is an 89 year old female with kidney dysfunction who presents with right ear pain and dizziness. She is accompanied by her daughter, Rosana Farnell.  She experiences right ear pain and fullness in both ears. An ear wax removal kit was used, resulting in some blood-tinged discharge. The right ear felt better after the procedure, but there was concern about potential bleeding. Both ears were treated, but the right ear is worse than the left. She describes her head feeling 'like it was roaring inside.'  She has been experiencing dizziness, weakness, and a lack of energy. When standing, she breaks out in a cold sweat and experiences shortness of breath. Her blood pressure was noted to be high, and she reports dizziness, weakness, and sweating when changing positions. No irregular heartbeats or racing heart during episodes of weakness and sweating.  She has a history of one nonfunctioning kidney and another functioning at 34%. She is under the care of a nephrologist and has a follow-up scheduled in October. She is concerned about the sodium content in electrolyte drinks due to her kidney condition. She is currently on labetalol , olmesartan , and amlodipine  for blood pressure management. She has a history of taking torsemide, but it is not currently part of her regimen.  She has a history of incontinence, which complicates urine sample collection. She has not had any episodes of incontinence until recently, around the  time of the visit. An EKG and blood work were performed recently, and a urinalysis was attempted but not completed due to difficulty in obtaining a sample. She has been drinking fluids all day to facilitate this.   *** Patient is present due to being fatigue, having lack of energy and dizziness X 2 weeks. Reports she is unable to do activity without exhaustion and recently experienced a fall. Also reports not sleeping well. No changes in her medications and also experiencing inner ear pain in right . She also reports headaches in the last week just about every day that sometimes last all day and other times comes and goes associated with some visual changes    {History (Optional):23778}  Medications: Outpatient Medications Prior to Visit  Medication Sig   acetaminophen  (TYLENOL ) 325 MG tablet Take 650 mg by mouth every 6 (six) hours as needed for mild pain.   Biotin 5000 MCG CAPS Take 5,000 mcg by mouth daily.   DULoxetine  (CYMBALTA ) 30 MG capsule TAKE 1 CAPSULE BY MOUTH DAILY   labetalol  (NORMODYNE ) 200 MG tablet TAKE 1 TABLET BY MOUTH 3 TIMES  DAILY   Lysine 500 MG CAPS Take 1 capsule by mouth daily.   Magnesium 500 MG TABS Take by mouth.   Olmesartan -amLODIPine -HCTZ 40-10-25 MG TABS Take 1 tablet by mouth daily.   pantoprazole  (PROTONIX ) 20 MG tablet TAKE 1 TABLET(20 MG) BY MOUTH DAILY   simvastatin  (ZOCOR ) 20 MG tablet TAKE 1 TABLET BY MOUTH ONCE  DAILY   No facility-administered  medications prior to visit.    {Insert previous labs (optional):23779} {See past labs  Heme  Chem  Endocrine  Serology  Results Review (optional):1}   Objective    BP (!) 144/54 Comment: manual  Pulse 64   Temp 97.9 F (36.6 C) (Oral)   Ht 5' 1 (1.549 m)   Wt 184 lb 8 oz (83.7 kg)   SpO2 99%   BMI 34.86 kg/m  {Insert last BP/Wt (optional):23777}{See vitals history (optional):1}   Physical Exam Vitals and nursing note reviewed.  Constitutional:      General: She is not in acute  distress.    Appearance: Normal appearance.  HENT:     Head: Normocephalic and atraumatic.     Ears:     Comments: Right: Hard impacted cerumen to top half of ear canal, will gelatinous blood present to caudal surface. Left: mixed of blood and ear wax.  Unable to visualize TMs bilaterally. Eyes:     General: No scleral icterus.    Conjunctiva/sclera: Conjunctivae normal.  Cardiovascular:     Rate and Rhythm: Normal rate.  Pulmonary:     Effort: Pulmonary effort is normal.  Neurological:     Mental Status: She is alert and oriented to person, place, and time. Mental status is at baseline.  Psychiatric:        Mood and Affect: Mood normal.        Behavior: Behavior normal.      No results found for any visits on 04/25/24.  Assessment & Plan    There are no diagnoses linked to this encounter.   Bilateral ear pain with possible infection and bleeding Right ear pain with fullness and blood-tinged discharge post ear wax removal. Possible external ear infection, limited visibility. - Refer to ENT for evaluation and management. - Prescribe Augmentin  for one week. - Encourage urine sample collection before antibiotics.  Dizziness and orthostatic hypotension Dizziness, weakness, and cold sweats upon standing likely due to orthostatic hypotension. Labetalol  may limit heart rate response. - Encourage fluid intake with electrolytes, avoid high sodium. - Consider cardiology follow-up for blood pressure management. - Consider long-term heart monitor for heart rate assessment.  Chronic kidney disease stage 4 with one nonfunctioning kidney Chronic kidney disease stage 4 with 34% kidney function. - Nephrology follow-up scheduled for October 15th.  Hypertension Hypertension managed with labetalol , olmesartan , and amlodipine . Labetalol  may contribute to orthostatic hypotension. - Consider cardiology follow-up for blood pressure regimen evaluation.  Urinary incontinence Urinary  incontinence complicates urine sample collection. - Attempt urine sample collection for urinalysis and culture. - Provide a hat for easier urine collection.  Headache Headache possibly related to ear issues or orthostatic hypotension.  Shortness of breath Shortness of breath possibly related to orthostatic hypotension or other conditions.    Bilateral ears (R>L)  Empiric augmentin  for ear and uti  05/02/24 at 3pm ***  No follow-ups on file.      I discussed the assessment and treatment plan with the patient  The patient was provided an opportunity to ask questions and all were answered. The patient agreed with the plan and demonstrated an understanding of the instructions.   The patient was advised to call back or seek an in-person evaluation if the symptoms worsen or if the condition fails to improve as anticipated.    LAURAINE LOISE BUOY, DO  [facility] [phone] (phone) [phone] (fax)  [facility]

## 2024-04-26 DIAGNOSIS — M791 Myalgia, unspecified site: Secondary | ICD-10-CM | POA: Insufficient documentation

## 2024-04-26 DIAGNOSIS — R0602 Shortness of breath: Secondary | ICD-10-CM | POA: Insufficient documentation

## 2024-04-28 ENCOUNTER — Telehealth: Payer: Self-pay

## 2024-04-28 ENCOUNTER — Ambulatory Visit: Payer: Self-pay | Admitting: Physician Assistant

## 2024-04-28 ENCOUNTER — Other Ambulatory Visit: Payer: Self-pay

## 2024-04-28 DIAGNOSIS — I10 Essential (primary) hypertension: Secondary | ICD-10-CM

## 2024-04-28 LAB — COMPREHENSIVE METABOLIC PANEL WITH GFR
ALT: 9 IU/L (ref 0–32)
AST: 11 IU/L (ref 0–40)
Albumin: 4.6 g/dL (ref 3.7–4.7)
Alkaline Phosphatase: 86 IU/L (ref 44–121)
BUN/Creatinine Ratio: 14 (ref 12–28)
BUN: 20 mg/dL (ref 8–27)
Bilirubin Total: 0.4 mg/dL (ref 0.0–1.2)
CO2: 20 mmol/L (ref 20–29)
Calcium: 9.9 mg/dL (ref 8.7–10.3)
Chloride: 98 mmol/L (ref 96–106)
Creatinine, Ser: 1.4 mg/dL — ABNORMAL HIGH (ref 0.57–1.00)
Globulin, Total: 1.8 g/dL (ref 1.5–4.5)
Glucose: 102 mg/dL — ABNORMAL HIGH (ref 70–99)
Potassium: 3.8 mmol/L (ref 3.5–5.2)
Sodium: 136 mmol/L (ref 134–144)
Total Protein: 6.4 g/dL (ref 6.0–8.5)
eGFR: 36 mL/min/1.73 — ABNORMAL LOW (ref >59)

## 2024-04-28 LAB — LIPID PANEL
Chol/HDL Ratio: 2.9 ratio (ref 0.0–4.4)
Cholesterol, Total: 166 mg/dL (ref 100–199)
HDL: 57 mg/dL (ref >39)
LDL Chol Calc (NIH): 87 mg/dL (ref 0–99)
Triglycerides: 127 mg/dL (ref 0–149)
VLDL Cholesterol Cal: 22 mg/dL (ref 5–40)

## 2024-04-28 LAB — CBC WITH DIFFERENTIAL/PLATELET
Basophils Absolute: 0.1 x10E3/uL (ref 0.0–0.2)
Basos: 1 %
EOS (ABSOLUTE): 0.2 x10E3/uL (ref 0.0–0.4)
Eos: 3 %
Hematocrit: 37.1 % (ref 34.0–46.6)
Hemoglobin: 12.2 g/dL (ref 11.1–15.9)
Immature Grans (Abs): 0 x10E3/uL (ref 0.0–0.1)
Immature Granulocytes: 0 %
Lymphocytes Absolute: 1.4 x10E3/uL (ref 0.7–3.1)
Lymphs: 24 %
MCH: 30.6 pg (ref 26.6–33.0)
MCHC: 32.9 g/dL (ref 31.5–35.7)
MCV: 93 fL (ref 79–97)
Monocytes Absolute: 0.5 x10E3/uL (ref 0.1–0.9)
Monocytes: 8 %
Neutrophils Absolute: 3.7 x10E3/uL (ref 1.4–7.0)
Neutrophils: 63 %
Platelets: 276 x10E3/uL (ref 150–450)
RBC: 3.99 x10E6/uL (ref 3.77–5.28)
RDW: 12.7 % (ref 11.7–15.4)
WBC: 5.9 x10E3/uL (ref 3.4–10.8)

## 2024-04-28 LAB — D-DIMER, QUANTITATIVE: D-DIMER: 0.84 mg{FEU}/L — ABNORMAL HIGH (ref 0.00–0.49)

## 2024-04-28 LAB — VITAMIN B12: Vitamin B-12: 327 pg/mL (ref 232–1245)

## 2024-04-28 LAB — URINALYSIS, ROUTINE W REFLEX MICROSCOPIC

## 2024-04-28 LAB — TSH: TSH: 1.88 u[IU]/mL (ref 0.450–4.500)

## 2024-04-28 LAB — MAGNESIUM: Magnesium: 2.3 mg/dL (ref 1.6–2.3)

## 2024-04-28 NOTE — Progress Notes (Signed)
 Please, check with [facility] if CK test could be added for myalgia. I ordered the test.

## 2024-04-28 NOTE — Telephone Encounter (Signed)
 Patients daughter Dorothe called to see if they can just bring the urine specimen over and drop it off as it has taken them the entire weekend to get the sample. Daughter does not think they will be able to collect the specimen again. Please return call asap

## 2024-04-28 NOTE — Telephone Encounter (Signed)
 Dorothe the daughter call back checking on the status of being able to drop off a urine sample for the patient as there is a bit of a time crunch., I spoke with Nat who spoke with Niels and she told me to tell Dorothe to just bring it by which I did.

## 2024-04-28 NOTE — Telephone Encounter (Signed)
 Copied from CRM [id number]. Topic: Clinical - Medical Advice >> Apr 28, 2024 10:04 AM Montie POUR wrote: Reason for CRM:  Dawn [name] could complete the urine sample yesterday evening and put it in refrigerator. Please call daughter to let her know if it is okay to bring sample in today. Her number is [phone]. Chart shows order has been cancelled. She needs to know by lunch time because [place]

## 2024-04-29 DIAGNOSIS — I10 Essential (primary) hypertension: Secondary | ICD-10-CM | POA: Diagnosis not present

## 2024-04-30 ENCOUNTER — Encounter: Payer: Self-pay | Admitting: Family Medicine

## 2024-04-30 ENCOUNTER — Telehealth: Payer: Self-pay

## 2024-04-30 LAB — URINALYSIS, ROUTINE W REFLEX MICROSCOPIC
Bilirubin, UA: NEGATIVE
Glucose, UA: NEGATIVE
Ketones, UA: NEGATIVE
Leukocytes,UA: NEGATIVE
Nitrite, UA: NEGATIVE
Protein,UA: NEGATIVE
RBC, UA: NEGATIVE
Specific Gravity, UA: 1.011 (ref 1.005–1.030)
Urobilinogen, Ur: 0.2 mg/dL (ref 0.2–1.0)
pH, UA: 6.5 (ref 5.0–7.5)

## 2024-04-30 LAB — SPECIMEN STATUS REPORT

## 2024-04-30 NOTE — Telephone Encounter (Signed)
 Advised to disregard specimen received new order would be sent today. She verbalized understanding

## 2024-04-30 NOTE — Telephone Encounter (Signed)
 Copied from CRM #[id number]. Topic: Clinical - Lab/Test Results >> Apr 30, 2024  9:33 AM Charlet HERO wrote: Reason for CRM: [facility] calling to verify lab work for patient has questions ref Number [id number] call back number [phone] She states that rcvd urine sample without test ordered on the  sample want to know if culture

## 2024-05-01 ENCOUNTER — Ambulatory Visit: Payer: Self-pay | Admitting: Physician Assistant

## 2024-05-02 DIAGNOSIS — H6123 Impacted cerumen, bilateral: Secondary | ICD-10-CM | POA: Diagnosis not present

## 2024-05-02 DIAGNOSIS — H903 Sensorineural hearing loss, bilateral: Secondary | ICD-10-CM | POA: Diagnosis not present

## 2024-05-07 NOTE — Addendum Note (Signed)
 Addended by: CHERRY CHIQUITA HERO on: 05/07/2024 11:36 AM   Modules accepted: Orders

## 2024-05-26 ENCOUNTER — Encounter: Payer: Self-pay | Admitting: Family Medicine

## 2024-05-26 ENCOUNTER — Ambulatory Visit (INDEPENDENT_AMBULATORY_CARE_PROVIDER_SITE_OTHER): Admitting: Family Medicine

## 2024-05-26 VITALS — BP 148/44 | HR 57 | Resp 16 | Ht 61.0 in | Wt 181.4 lb

## 2024-05-26 DIAGNOSIS — I129 Hypertensive chronic kidney disease with stage 1 through stage 4 chronic kidney disease, or unspecified chronic kidney disease: Secondary | ICD-10-CM

## 2024-05-26 DIAGNOSIS — Z23 Encounter for immunization: Secondary | ICD-10-CM | POA: Diagnosis not present

## 2024-05-26 DIAGNOSIS — I10 Essential (primary) hypertension: Secondary | ICD-10-CM

## 2024-05-26 DIAGNOSIS — N1832 Chronic kidney disease, stage 3b: Secondary | ICD-10-CM | POA: Diagnosis not present

## 2024-05-26 DIAGNOSIS — E7800 Pure hypercholesterolemia, unspecified: Secondary | ICD-10-CM | POA: Diagnosis not present

## 2024-05-26 DIAGNOSIS — I69354 Hemiplegia and hemiparesis following cerebral infarction affecting left non-dominant side: Secondary | ICD-10-CM | POA: Diagnosis not present

## 2024-05-26 DIAGNOSIS — N2581 Secondary hyperparathyroidism of renal origin: Secondary | ICD-10-CM

## 2024-05-26 DIAGNOSIS — N261 Atrophy of kidney (terminal): Secondary | ICD-10-CM

## 2024-05-26 MED ORDER — OLMESARTAN-AMLODIPINE-HCTZ 40-10-25 MG PO TABS: 1.0000 | ORAL_TABLET | Freq: Every day | ORAL | 3 refills | Status: AC

## 2024-05-26 MED ORDER — SIMVASTATIN 10 MG PO TABS: 10.0000 mg | ORAL_TABLET | Freq: Every day | ORAL | 3 refills | Status: AC

## 2024-05-26 MED ORDER — HYDRALAZINE HCL 10 MG PO TABS: 10.0000 mg | ORAL_TABLET | Freq: Three times a day (TID) | ORAL | 1 refills | Status: DC

## 2024-05-26 NOTE — Progress Notes (Signed)
 Established patient visit   Patient: Dawn [name]   DOB: May 29, 1934   89 y.o. Female  MRN: [id number] Visit Date: 05/26/2024  Today's healthcare provider: LAURAINE LOISE BUOY, DO   Chief Complaint  Patient presents with   Medical Management of Chronic Issues    HTN-patient needs refills   Subjective    HPI Dawn [name] is an 89 year old female with hypertension who presents for medication management and blood pressure control.  She has ongoing issues with blood pressure management, currently taking a combination medication that includes amlodipine , hydrochlorothiazide , and olmesartan , as well as labetalol  200 mg three times a day. Her blood pressure readings at home have been around 140-150/50s, with occasional dizziness but no significant changes in blood pressure during these episodes. She has a history of high blood pressure for many years and has been on labetalol  for approximately 10-12 years.  She is concerned about running out of her blood pressure medication due to a prescription issue with her pharmacy, [facility]. It can take up to two weeks to receive medications from them, and she is worried about running out before the new supply arrives.  She has been taking simvastatin , which she was advised to cut in half due to muscle aches. She takes 10 mg instead of 20 mg, but the pharmacy continues to send the 20 mg tablets, leading to waste.  She has a past history of a stroke that resulted in mild residual weakness on her left side, causing her to drag her left foot slightly. She uses a cane for support.  She has a history of shingles, having experienced it twice, with the last episode occurring a couple of years ago affecting her head and neck. She has not received the shingles vaccine and is uncertain about getting it.  She reports having only one kidney and is cautious about medications that could potentially harm her kidney. She has been prescribed duloxetine  for  neuropathy but chooses not to take it due to concerns about kidney damage.  She occasionally checks her blood pressure at home, especially when feeling unwell or experiencing headaches, but does not have a set schedule for monitoring. No chest pain or shortness of breath.  She lives alone and expresses some difficulty managing her medications and health care needs independently.       Medications: Outpatient Medications Prior to Visit  Medication Sig Note   acetaminophen  (TYLENOL ) 325 MG tablet Take 650 mg by mouth every 6 (six) hours as needed for mild pain.    Biotin 5000 MCG CAPS Take 5,000 mcg by mouth daily.    labetalol  (NORMODYNE ) 200 MG tablet TAKE 1 TABLET BY MOUTH 3 TIMES  DAILY    Lysine 500 MG CAPS Take 1 capsule by mouth daily.    Magnesium 500 MG TABS Take by mouth.    pantoprazole  (PROTONIX ) 20 MG tablet TAKE 1 TABLET(20 MG) BY MOUTH DAILY (Patient taking differently: Take 20 mg by mouth daily as needed for indigestion or heartburn. TAKE 1 TABLET(20 MG) BY MOUTH DAILY)    [DISCONTINUED] amoxicillin -clavulanate (AUGMENTIN ) 875-125 MG tablet Take 1 tablet by mouth 2 (two) times daily.    [DISCONTINUED] DULoxetine  (CYMBALTA ) 30 MG capsule TAKE 1 CAPSULE BY MOUTH DAILY 05/26/2024: pt preference   [DISCONTINUED] Olmesartan -amLODIPine -HCTZ 40-10-25 MG TABS Take 1 tablet by mouth daily.    [DISCONTINUED] simvastatin  (ZOCOR ) 20 MG tablet TAKE 1 TABLET BY MOUTH ONCE  DAILY 05/26/2024: does not tolerate higher dose (myalgias)  No facility-administered medications prior to visit.    Review of Systems  Respiratory: Negative.  Negative for cough, shortness of breath and wheezing.   Cardiovascular:  Negative for chest pain, palpitations and leg swelling.  Neurological:  Negative for weakness and headaches.        Objective    BP (!) 148/44 (BP Location: Left Arm, Patient Position: Sitting, Cuff Size: Normal)   Pulse (!) 57   Resp 16   Wt 181 lb 6.4 oz (82.3 kg)   SpO2 100%    BMI 34.28 kg/m     Physical Exam Constitutional:      Appearance: Normal appearance.  HENT:     Head: Normocephalic and atraumatic.  Eyes:     General: No scleral icterus.    Extraocular Movements: Extraocular movements intact.     Conjunctiva/sclera: Conjunctivae normal.  Cardiovascular:     Rate and Rhythm: Normal rate and regular rhythm.     Pulses: Normal pulses.     Heart sounds: Normal heart sounds.  Pulmonary:     Effort: Pulmonary effort is normal. No respiratory distress.     Breath sounds: Normal breath sounds.  Musculoskeletal:     Right lower leg: No edema.     Left lower leg: No edema.  Skin:    General: Skin is warm and dry.  Neurological:     Mental Status: She is alert and oriented to person, place, and time. Mental status is at baseline.  Psychiatric:        Mood and Affect: Mood normal.        Behavior: Behavior normal.      No results found for any visits on 05/26/24.  Assessment & Plan    Hypertensive kidney disease with stage 3b chronic kidney disease (HCC) -     Olmesartan -amLODIPine -HCTZ; Take 1 tablet by mouth daily.  Dispense: 90 tablet; Refill: 3 -     hydrALAZINE  HCl; Take 1 tablet (10 mg total) by mouth 3 (three) times daily.  Dispense: 90 tablet; Refill: 1  Atrophy of right kidney  Hemiparesis affecting left side as late effect of cerebrovascular accident (CVA) (HCC)  Hypercholesteremia -     Simvastatin ; Take 1 tablet (10 mg total) by mouth at bedtime.  Dispense: 90 tablet; Refill: 3  Flu vaccine need -     Flu vaccine HIGH DOSE PF(Fluzone Trivalent)  Hyperparathyroidism due to renal insufficiency     Hypertensive kidney disease with stage 3b chronic kidney disease; atrophy of right kidney Chronic, stable.  Blood pressure remains mildly elevated despite current medication regimen. Occasional dizziness noted, raising fall risk concerns with aggressive reduction, particularly given patient's advanced age. - Refill  olmesartan -amlodipine -HCTZ combination medication. - Continue labetalol  200 mg 3 times daily - Prescribe additional low-dose antihypertensive medication (hydralazine  10 mg 3 times daily). - Advise home blood pressure monitoring, especially if dizziness or systolic <120. - Plan recheck in 4-6 weeks. - Instruct to contact office if medication not received by Thursday.  Hemiparesis affecting left side as late effect of cerebrovascular accident (CVA) Residual left-sided weakness with slight foot dragging and mild facial asymmetry. No recent changes reported.  Continue using cane.  Consider utilizing walker for increased stability.  Continue to optimize risk factors to prevent CVA recurrence.  Continue to monitor.  Hypercholesterolemia On simvastatin , previously increased to 20 mg but experienced muscle aches.  Previously advised to cut 20 mg tablets in half to reduce side effects. - Order simvastatin  10 mg tablets to replace the  20 mg tablets.  Hyperparathyroidism due to renal insufficiency Chronic, overall stable.  Followed by nephrology; defer to specialist management.  General Health Maintenance Discussed vaccinations: shingles, tetanus, and COVID-19. She has had shingles twice, affecting head and neck. Debating shingles vaccine due to past experiences. Concerns about COVID-19 vaccine due to anecdotal reports of heart attacks. Discussed risks of COVID-19 infection versus vaccine. - Recommend tetanus vaccine, to be obtained at pharmacy. - Discuss risks and benefits of shingles and COVID-19 vaccines.  Patient will obtain both of these at pharmacy if she decides to go ahead and get them.    Return in about 5 weeks (around 06/30/2024) for HTN.      I discussed the assessment and treatment plan with the patient  The patient was provided an opportunity to ask questions and all were answered. The patient agreed with the plan and demonstrated an understanding of the instructions.   The patient  was advised to call back or seek an in-person evaluation if the symptoms worsen or if the condition fails to improve as anticipated.    LAURAINE LOISE BUOY, DO  [facility] [phone] (phone) [phone] (fax)  [facility]

## 2024-05-26 NOTE — Patient Instructions (Signed)
 Recommended vaccines: Tdap (tetanus, diphtheria and pertussis) and Shingrix (shingles).

## 2024-06-09 DIAGNOSIS — N261 Atrophy of kidney (terminal): Secondary | ICD-10-CM | POA: Insufficient documentation

## 2024-06-09 DIAGNOSIS — N2581 Secondary hyperparathyroidism of renal origin: Secondary | ICD-10-CM | POA: Insufficient documentation

## 2024-06-19 DIAGNOSIS — I129 Hypertensive chronic kidney disease with stage 1 through stage 4 chronic kidney disease, or unspecified chronic kidney disease: Secondary | ICD-10-CM | POA: Diagnosis not present

## 2024-06-19 DIAGNOSIS — N2581 Secondary hyperparathyroidism of renal origin: Secondary | ICD-10-CM | POA: Diagnosis not present

## 2024-06-19 DIAGNOSIS — N1330 Unspecified hydronephrosis: Secondary | ICD-10-CM | POA: Diagnosis not present

## 2024-06-19 DIAGNOSIS — I10 Essential (primary) hypertension: Secondary | ICD-10-CM | POA: Diagnosis not present

## 2024-06-19 DIAGNOSIS — N261 Atrophy of kidney (terminal): Secondary | ICD-10-CM | POA: Diagnosis not present

## 2024-06-25 DIAGNOSIS — N2581 Secondary hyperparathyroidism of renal origin: Secondary | ICD-10-CM | POA: Diagnosis not present

## 2024-06-25 DIAGNOSIS — I10 Essential (primary) hypertension: Secondary | ICD-10-CM | POA: Diagnosis not present

## 2024-06-25 DIAGNOSIS — I129 Hypertensive chronic kidney disease with stage 1 through stage 4 chronic kidney disease, or unspecified chronic kidney disease: Secondary | ICD-10-CM | POA: Diagnosis not present

## 2024-06-25 DIAGNOSIS — N1832 Chronic kidney disease, stage 3b: Secondary | ICD-10-CM | POA: Diagnosis not present

## 2024-06-25 DIAGNOSIS — N261 Atrophy of kidney (terminal): Secondary | ICD-10-CM | POA: Diagnosis not present

## 2024-06-25 DIAGNOSIS — R600 Localized edema: Secondary | ICD-10-CM | POA: Diagnosis not present

## 2024-07-01 DIAGNOSIS — H04123 Dry eye syndrome of bilateral lacrimal glands: Secondary | ICD-10-CM | POA: Diagnosis not present

## 2024-07-01 DIAGNOSIS — Z961 Presence of intraocular lens: Secondary | ICD-10-CM | POA: Diagnosis not present

## 2024-07-01 DIAGNOSIS — H43813 Vitreous degeneration, bilateral: Secondary | ICD-10-CM | POA: Diagnosis not present

## 2024-07-02 ENCOUNTER — Ambulatory Visit: Admitting: Family Medicine

## 2024-07-03 ENCOUNTER — Ambulatory Visit: Payer: Self-pay

## 2024-07-03 NOTE — Telephone Encounter (Signed)
 FYI Only or Action Required?: Action required by provider: please call with afternoon cancellation for missed FU on 10/22.  Patient was last seen in primary care on 05/26/2024 by Donzella Lauraine SAILOR, DO.  Called Nurse Triage reporting Dizziness.  Symptoms began NA.  Interventions attempted: Other: NA.  Symptoms are: stable.  Triage Disposition: No disposition on file.  Patient/caregiver understands and will follow disposition?:   Copied from CRM [id number]. Topic: Clinical - Red Word Triage >> Jul 03, 2024 10:42 AM Joesph NOVAK wrote: Red Word that prompted transfer to Nurse Triage: dizziness from medication,  hydrALAZINE  (APRESOLINE ) 10 MG tablet. Answer Assessment - Initial Assessment Questions 1. REASON FOR CALL: What is the main reason for your call? or How can I best help you?     Patient missed scheduled FU with PCP yesterday and looking to reschedule. The appt was to FU on starting new BP medication. Dizziness episode was one time and the provider is aware of it. No new or worsening symptoms. Patient to call back with any questions or concerns.   Appt scheduled for 10/31- can only come in the afternoons. Please reach out to patient if any sooner appts become available in the afternoon with dr Donzella  Protocols used: Information Only Call - No Triage-A-AH

## 2024-07-06 ENCOUNTER — Other Ambulatory Visit: Payer: Self-pay | Admitting: Family Medicine

## 2024-07-06 DIAGNOSIS — I129 Hypertensive chronic kidney disease with stage 1 through stage 4 chronic kidney disease, or unspecified chronic kidney disease: Secondary | ICD-10-CM

## 2024-07-11 ENCOUNTER — Ambulatory Visit (INDEPENDENT_AMBULATORY_CARE_PROVIDER_SITE_OTHER): Admitting: Family Medicine

## 2024-07-11 ENCOUNTER — Encounter: Payer: Self-pay | Admitting: Family Medicine

## 2024-07-11 VITALS — BP 153/54 | HR 54 | Temp 98.3°F | Ht 61.0 in | Wt 186.5 lb

## 2024-07-11 DIAGNOSIS — I129 Hypertensive chronic kidney disease with stage 1 through stage 4 chronic kidney disease, or unspecified chronic kidney disease: Secondary | ICD-10-CM | POA: Diagnosis not present

## 2024-07-11 DIAGNOSIS — R269 Unspecified abnormalities of gait and mobility: Secondary | ICD-10-CM

## 2024-07-11 DIAGNOSIS — N1832 Chronic kidney disease, stage 3b: Secondary | ICD-10-CM | POA: Diagnosis not present

## 2024-07-11 DIAGNOSIS — R011 Cardiac murmur, unspecified: Secondary | ICD-10-CM | POA: Diagnosis not present

## 2024-07-11 DIAGNOSIS — R0602 Shortness of breath: Secondary | ICD-10-CM

## 2024-07-11 MED ORDER — HYDRALAZINE HCL 10 MG PO TABS: 20.0000 mg | ORAL_TABLET | Freq: Three times a day (TID) | ORAL | 3 refills | Status: DC

## 2024-07-11 NOTE — Progress Notes (Signed)
 Established patient visit   Patient: Dawn [name]   DOB: [birth year]   89 y.o. Female  MRN: [id number] Visit Date: 07/11/2024  Today's healthcare provider: LAURAINE LOISE BUOY, DO   Chief Complaint  Patient presents with   Follow-up    Patient states that she is here for a recheck from a medication that she was put on about a month ago.  Reports that nephrologist thought that it should be a higher dose but patient was concerned considering she was just put on it.  Wants advice on if it should be increased.   Subjective    HPI Dawn [name] is an 89 year old female with hypertension who presents with concerns about medication management and worsening shortness of breath.  She is currently taking hydralazine  10 mg three times a day. Her nephrologist suggested doubling her hydralazine  dose, but she has not done so as she wanted to consult with her primary care provider first. Her home blood pressure readings have been fluctuating, with some readings as high as 177/96 and 168/99. No dizziness has been experienced recently.  She has been experiencing worsening shortness of breath for quite some time, which limits her ability to stay active. She becomes short of breath with minimal exertion, such as walking short distances, and needs to rest frequently. No chest pain is present, but she mentions frequent coughing and sneezing, attributing it to allergies.  She describes a staggering gait that has been present for quite a while and is worsening. She feels her legs 'just don't want to fluctuate,' and she is concerned about her balance. She wonders if this could be related to neuropathy.  She is scheduled to see her cardiologist soon. Her last echocardiogram was performed in 2022.  Her nephrologist mentioned a drop in her kidney function, and she plans to follow up after Christmas.      Medications: Outpatient Medications Prior to Visit  Medication Sig   acetaminophen  (TYLENOL )  325 MG tablet Take 650 mg by mouth every 6 (six) hours as needed for mild pain.   Biotin 5000 MCG CAPS Take 5,000 mcg by mouth daily.   labetalol  (NORMODYNE ) 200 MG tablet TAKE 1 TABLET BY MOUTH 3 TIMES  DAILY   Lysine 500 MG CAPS Take 1 capsule by mouth daily.   Magnesium 500 MG TABS Take by mouth.   Olmesartan -amLODIPine -HCTZ 40-10-25 MG TABS Take 1 tablet by mouth daily.   pantoprazole  (PROTONIX ) 20 MG tablet TAKE 1 TABLET(20 MG) BY MOUTH DAILY (Patient taking differently: Take 20 mg by mouth daily as needed for indigestion or heartburn. TAKE 1 TABLET(20 MG) BY MOUTH DAILY)   simvastatin  (ZOCOR ) 10 MG tablet Take 1 tablet (10 mg total) by mouth at bedtime.   [DISCONTINUED] hydrALAZINE  (APRESOLINE ) 10 MG tablet Take 1 tablet (10 mg total) by mouth 3 (three) times daily.   No facility-administered medications prior to visit.        Objective    BP (!) 153/54 (BP Location: Left Arm, Patient Position: Sitting, Cuff Size: Normal)   Pulse (!) 54   Temp 98.3 F (36.8 C) (Oral)   Ht 5' 1 (1.549 m)   Wt 186 lb 8 oz (84.6 kg)   SpO2 99%   BMI 35.24 kg/m     Physical Exam Constitutional:      Appearance: Normal appearance.  HENT:     Head: Normocephalic and atraumatic.  Eyes:     General: No scleral icterus.  Extraocular Movements: Extraocular movements intact.     Conjunctiva/sclera: Conjunctivae normal.  Cardiovascular:     Rate and Rhythm: Normal rate and regular rhythm.     Pulses: Normal pulses.     Heart sounds: Murmur (systolic) heard.  Pulmonary:     Effort: Pulmonary effort is normal. No respiratory distress.     Breath sounds: Normal breath sounds.  Abdominal:     General: Bowel sounds are normal. There is no distension.     Palpations: Abdomen is soft. There is no mass.     Tenderness: There is no abdominal tenderness. There is no guarding.  Musculoskeletal:     Right lower leg: No edema.     Left lower leg: No edema.  Skin:    General: Skin is warm and dry.   Neurological:     Mental Status: She is alert and oriented to person, place, and time. Mental status is at baseline.  Psychiatric:        Mood and Affect: Mood normal.        Behavior: Behavior normal.      No results found for any visits on 07/11/24.  Assessment & Plan    Hypertensive kidney disease with stage 3b chronic kidney disease (HCC) -     hydrALAZINE  HCl; Take 2 tablets (20 mg total) by mouth 3 (three) times daily.  Dispense: 180 tablet; Refill: 3  Shortness of breath -     Pulmonary Visit  Heart murmur  Altered gait      Primary hypertensive kdney disease with stage 3b chronic kidney disease  Chronically elevated. Hypertension uncontrolled with fluctuating home readings, up into SBP 170s. Kidney function decline noted. Nephrologist recommended increasing hydralazine  dose. No dizziness reported, indicating tolerance.  Nephrologist follow-up scheduled.  - Increase hydralazine  dose as recommended. - Monitor home blood pressure readings. - Report dizziness or concerns if they occur; can defer to cardiology appointment symptoms occur in close proximity to it. - Send prescription to [facility]. - Follow up with nephrologist after Christmas unless needed sooner. Defer to specialist management.  Shortness of breath Chronic shortness of breath worsening, possibly cardiac or pulmonary in origin. Significant exertional dyspnea contributes to evaluation need for supplemental oxygen. - Refer to pulmonologist for evaluation and possible supplemental oxygen. - Consider pulmonary function test and six-minute walk test.  Heart murmur Heart murmur noted, possibly related to shortness of breath. Cardiologist consultation scheduled. - Discuss heart murmur and impact on shortness of breath with cardiologist. - defer to specialist management  Altered gait Potentially due to physical deconditioning.  After shortness of breath is able to be addressed, consider referral to  physical therapy to rebuild strength.    Return in about 3 months (around 10/11/2024) for Chronic f/u.      I discussed the assessment and treatment plan with the patient  The patient was provided an opportunity to ask questions and all were answered. The patient agreed with the plan and demonstrated an understanding of the instructions.   The patient was advised to call back or seek an in-person evaluation if the symptoms worsen or if the condition fails to improve as anticipated.    LAURAINE LOISE BUOY, DO  [facility] [phone] (phone) [phone] (fax)  [facility]

## 2024-07-20 ENCOUNTER — Other Ambulatory Visit: Payer: Self-pay | Admitting: Family Medicine

## 2024-07-20 DIAGNOSIS — I10 Essential (primary) hypertension: Secondary | ICD-10-CM

## 2024-07-20 DIAGNOSIS — G629 Polyneuropathy, unspecified: Secondary | ICD-10-CM

## 2024-07-22 ENCOUNTER — Ambulatory Visit: Admitting: Family Medicine

## 2024-07-23 ENCOUNTER — Other Ambulatory Visit: Payer: Self-pay | Admitting: Family Medicine

## 2024-07-23 DIAGNOSIS — I10 Essential (primary) hypertension: Secondary | ICD-10-CM

## 2024-07-23 NOTE — Telephone Encounter (Unsigned)
 Copied from CRM [id number]. Topic: Clinical - Medication Refill >> Jul 23, 2024  2:45 PM Tiffini S wrote: Medication: labetalol  (NORMODYNE ) 200 MG tablet  Has the patient contacted their pharmacy? Yes (Agent: If no, request that the patient contact the pharmacy for the refill. If patient does not wish to contact the pharmacy document the reason why and proceed with request.) (Agent: If yes, when and what did the pharmacy advise?)  This is the patient's preferred pharmacy:    [facility] [place] [place] - [address] [place] [address] Phone: [phone] Fax: [phone]  Is this the correct pharmacy for this prescription? Yes If no, delete pharmacy and type the correct one.   Has the prescription been filled recently? Yes  Is the patient out of the medication? No  Has the patient been seen for an appointment in the last year OR does the patient have an upcoming appointment? Yes  Can we respond through MyChart? No, please call patient at [phone]  Agent: Please be advised that Rx refills may take up to 3 business days. We ask that you follow-up with your pharmacy.

## 2024-07-25 MED ORDER — LABETALOL HCL 200 MG PO TABS: 200.0000 mg | ORAL_TABLET | Freq: Three times a day (TID) | ORAL | 1 refills | Status: AC

## 2024-07-25 NOTE — Telephone Encounter (Signed)
 Requested Prescriptions  Pending Prescriptions Disp Refills   labetalol  (NORMODYNE ) 200 MG tablet 270 tablet 1    Sig: Take 1 tablet (200 mg total) by mouth 3 (three) times daily.     Cardiovascular:  Beta Blockers Failed - 07/25/2024  1:17 PM      Failed - Last BP in normal range    BP Readings from Last 1 Encounters:  07/11/24 (!) 153/54         Passed - Last Heart Rate in normal range    Pulse Readings from Last 1 Encounters:  07/11/24 (!) 54         Passed - Valid encounter within last 6 months    Recent Outpatient Visits           2 weeks ago Hypertensive kidney disease with stage 3b chronic kidney disease (HCC)   [place] [facility], Lauraine SAILOR, DO   2 months ago Hypertensive kidney disease with stage 3b chronic kidney disease [facility])   [place] [facility] [place], Lauraine SAILOR, DO   3 months ago Ear pain, bilateral   [facility] [place], Lauraine SAILOR, DO   3 months ago Primary hypertension   [place] [facility] [place], [place], PA-C   6 months ago Primary hypertension   [place] [facility] [place], [place], MD

## 2024-07-30 DIAGNOSIS — R55 Syncope and collapse: Secondary | ICD-10-CM | POA: Diagnosis not present

## 2024-07-30 DIAGNOSIS — I10 Essential (primary) hypertension: Secondary | ICD-10-CM | POA: Diagnosis not present

## 2024-07-30 DIAGNOSIS — R001 Bradycardia, unspecified: Secondary | ICD-10-CM | POA: Diagnosis not present

## 2024-07-30 DIAGNOSIS — Z9181 History of falling: Secondary | ICD-10-CM | POA: Diagnosis not present

## 2024-07-30 DIAGNOSIS — R600 Localized edema: Secondary | ICD-10-CM | POA: Diagnosis not present

## 2024-07-30 DIAGNOSIS — R42 Dizziness and giddiness: Secondary | ICD-10-CM | POA: Diagnosis not present

## 2024-07-30 DIAGNOSIS — R0609 Other forms of dyspnea: Secondary | ICD-10-CM | POA: Diagnosis not present

## 2024-07-30 DIAGNOSIS — I340 Nonrheumatic mitral (valve) insufficiency: Secondary | ICD-10-CM | POA: Diagnosis not present

## 2024-07-30 DIAGNOSIS — I700 Atherosclerosis of aorta: Secondary | ICD-10-CM | POA: Diagnosis not present

## 2024-07-30 DIAGNOSIS — I071 Rheumatic tricuspid insufficiency: Secondary | ICD-10-CM | POA: Diagnosis not present

## 2024-08-04 ENCOUNTER — Other Ambulatory Visit: Payer: Self-pay

## 2024-08-04 DIAGNOSIS — I129 Hypertensive chronic kidney disease with stage 1 through stage 4 chronic kidney disease, or unspecified chronic kidney disease: Secondary | ICD-10-CM

## 2024-08-04 NOTE — Telephone Encounter (Signed)
 Copied from CRM #[id number]. Topic: Clinical - Medication Question >> Aug 04, 2024  1:47 PM Amy B wrote: Reason for CRM: Patient states her hydrALAZINE  (APRESOLINE ) 10 MG tablet was to be increased to 20 mg.  She has not received a prescription and is running out of the medication.  Please advise.

## 2024-08-05 NOTE — Telephone Encounter (Signed)
 Called patient to inform her that there was a prescription that was sent to [facility] of 180 tablets (30 day supply) with 3 refills.  She stated that she would call them to see about getting her prescription.

## 2024-08-05 NOTE — Telephone Encounter (Signed)
 Stated that she was not aware that they was sent the prescription.

## 2024-08-12 ENCOUNTER — Encounter: Payer: Self-pay | Admitting: Pulmonary Disease

## 2024-08-12 ENCOUNTER — Ambulatory Visit: Admitting: Pulmonary Disease

## 2024-08-12 VITALS — BP 146/60 | HR 87 | Temp 97.8°F | Ht 61.0 in | Wt 185.8 lb

## 2024-08-12 DIAGNOSIS — R053 Chronic cough: Secondary | ICD-10-CM | POA: Diagnosis not present

## 2024-08-12 DIAGNOSIS — J309 Allergic rhinitis, unspecified: Secondary | ICD-10-CM

## 2024-08-12 DIAGNOSIS — R0602 Shortness of breath: Secondary | ICD-10-CM

## 2024-08-12 MED ORDER — ALBUTEROL SULFATE HFA 108 (90 BASE) MCG/ACT IN AERS: 2.0000 | INHALATION_SPRAY | Freq: Four times a day (QID) | RESPIRATORY_TRACT | 6 refills | Status: AC | PRN

## 2024-08-12 NOTE — Progress Notes (Unsigned)
 Synopsis: Referred in by Donzella Lauraine SAILOR, DO   Subjective:   PATIENT ID: Dawn [name] GENDER: female DOB: 11-16-1933, MRN: [id number]  Chief Complaint  Patient presents with   Shortness of Breath    DOE. No wheezing. Cough, dry.      HPI Discussed the use of AI scribe software for clinical note transcription with the patient, who gave verbal consent to proceed.  History of Present Illness   Dawn [name] is an 89 year old female who presents with shortness of breath and cough.  She experiences shortness of breath primarily during physical activity, such as walking or moving around, but not at rest. She has no history of asthma or smoking. No wheezing is present.  She has a persistent dry cough that occurs most days, which she suspects might be related to a new medication. She attributes some of her symptoms to allergies, noting frequent sneezing, a runny nose, and watery eyes. She uses Nasocort and takes Allegra daily for her allergies.  She experiences chest pain only when consuming spicy foods, which she attributes to heartburn and manages with medication.  She experiences swelling in her legs, which she wonders might be related to changes in her blood pressure medications. Her blood pressure has been fluctuating, with low diastolic and high systolic readings. She is currently taking multiple medications for blood pressure, including labetalol , hydralazine , and HCTZ, with frequent dosage adjustments.   Family History  Problem Relation Age of Onset   Cancer Father        stomach   Emphysema Father    Hypertension Mother    Atrial fibrillation Mother    Diabetes Sister    Hypertension Sister    Skin cancer Sister    Hypertension Brother    Heart disease Brother    Heart disease Sister    Colon polyps Sister    Hypertension Sister    Thyroid  disease Sister    Irritable bowel syndrome Sister    Breast cancer Sister    Cancer Sister        breast   Heart  disease Brother    Colon polyps Brother    Cancer Brother        stomach   Diabetes Brother    Hypertension Brother    Neuropathy Brother    Post-traumatic stress disorder Brother    Hypertension Brother    Pneumonia Brother    Breast cancer Paternal Aunt    Breast cancer Paternal Grandmother    Breast cancer Paternal Aunt    Breast cancer Paternal Aunt    Breast cancer Paternal Aunt    Breast cancer Other      Social History   Socioeconomic History   Marital status: Divorced    Spouse name: none   Number of children: 1   Years of education: 13   Highest education level: Some college, no degree  Occupational History   Occupation: retired  Tobacco Use   Smoking status: Never   Smokeless tobacco: Never  Vaping Use   Vaping status: Never Used  Substance and Sexual Activity   Alcohol use: No   Drug use: No   Sexual activity: Never  Other Topics Concern   Not on file  Social History Narrative   Not on file   Social Drivers of Health   Financial Resource Strain: Low Risk  (01/09/2024)   Overall Financial Resource Strain (CARDIA)    Difficulty of Paying Living Expenses: Not hard at all  Food Insecurity: No Food Insecurity (01/09/2024)   Hunger Vital Sign    Worried About Running Out of Food in the Last Year: Never true    Ran Out of Food in the Last Year: Never true  Transportation Needs: No Transportation Needs (01/09/2024)   PRAPARE - Administrator, Civil Service (Medical): No    Lack of Transportation (Non-Medical): No  Physical Activity: Inactive (01/09/2024)   Exercise Vital Sign    Days of Exercise per Week: 0 days    Minutes of Exercise per Session: 0 min  Stress: No Stress Concern Present (01/09/2024)   [employer] of Occupational Health - Occupational Stress Questionnaire    Feeling of Stress : Not at all  Social Connections: Socially Isolated (01/09/2024)   Social Connection and Isolation Panel    Frequency of Communication with Friends  and Family: More than three times a week    Frequency of Social Gatherings with Friends and Family: Once a week    Attends Religious Services: Never    Database Administrator or Organizations: No    Attends Banker Meetings: Never    Marital Status: Divorced  Catering Manager Violence: Not At Risk (01/09/2024)   Humiliation, Afraid, Rape, and Kick questionnaire    Fear of Current or Ex-Partner: No    Emotionally Abused: No    Physically Abused: No    Sexually Abused: No        Objective:   Vitals:   08/12/24 1352  BP: (!) 146/60  Pulse: 87  Temp: 97.8 F (36.6 C)  SpO2: 98%  Weight: 185 lb 12.8 oz (84.3 kg)  Height: 5' 1 (1.549 m)   98% on RA BMI Readings from Last 3 Encounters:  08/12/24 35.11 kg/m  07/11/24 35.24 kg/m  05/26/24 34.28 kg/m   Wt Readings from Last 3 Encounters:  08/12/24 185 lb 12.8 oz (84.3 kg)  07/11/24 186 lb 8 oz (84.6 kg)  05/26/24 181 lb 6.4 oz (82.3 kg)    Physical Exam GEN: NAD HEENT: Supple Neck, Reactive Pupils, EOMI  CVS: Normal S1, Normal S2, RRR, No murmurs or ES appreciated  Lungs: Clear bilateral air entry.  Abdomen: Soft, non tender, non distended, + BS  Extremities: Warm and well perfused, No edema   Labs and imaging reviewed.  Ancillary Information   CBC    Component Value Date/Time   WBC 5.9 04/25/2024 1013   WBC 5.7 05/16/2022 1142   RBC 3.99 04/25/2024 1013   RBC 4.16 05/16/2022 1142   HGB 12.2 04/25/2024 1013   HCT 37.1 04/25/2024 1013   PLT 276 04/25/2024 1013   MCV 93 04/25/2024 1013   MCV 89 12/22/2013 1352   MCH 30.6 04/25/2024 1013   MCH 30.3 05/16/2022 1142   MCHC 32.9 04/25/2024 1013   MCHC 33.7 05/16/2022 1142   RDW 12.7 04/25/2024 1013   RDW 15.2 (H) 12/22/2013 1352   LYMPHSABS 1.4 04/25/2024 1013   LYMPHSABS 1.1 12/22/2013 1352   MONOABS 0.6 07/30/2015 1902   MONOABS 0.6 12/22/2013 1352   EOSABS 0.2 04/25/2024 1013   EOSABS 0.1 12/22/2013 1352   BASOSABS 0.1 04/25/2024 1013    BASOSABS 0.1 12/22/2013 1352         No data to display           Assessment & Plan:  Assessment and Plan    #Chronic cough, possible cough variant asthma Chronic dry cough possibly due to cough variant asthma. No asthma history,  but family history of COPD and brown lung disease. Cough persistent without wheezing. Differential includes CVA, medication-induced cough, but no lisinopril use. Exertional dyspnea likely cardiac. - Prescribed albuterol inhaler as needed for cough. - Consider further inhaler therapy if symptoms persist. \- Pending cardiac evaluation  #Allergic rhinitis Chronic allergic rhinitis with sneezing, runny nose, and itchy eyes. Managed with Allegra and Nasocort. - Continue Allegra and Nasocort.     Return in about 3 months (around 11/10/2024).  I personally spent a total of 60 minutes in the care of the patient today including preparing to see the patient, getting/reviewing separately obtained history, performing a medically appropriate exam/evaluation, counseling and educating, placing orders, documenting clinical information in the EHR, independently interpreting results, and communicating results.   Darrin Barn, MD [facility] 08/12/2024 2:21 PM

## 2024-08-18 NOTE — Procedures (Signed)
 5-day Holter monitor 07/30/2024 - 08/04/2024 revealed   1.  Predominant sinus bradycardia, mean heart rate 57 bpm, range 40 to 75 bpm  2.  Episodes of junctional escape rhythm were observed  3.  Frequent premature atrial contractions (3%)  4.  Occasional premature ventricular contractions  5.  Rare brief runs of SVT, longest lasting 10 seconds  6.  No diary entries

## 2024-08-27 ENCOUNTER — Telehealth: Payer: Self-pay

## 2024-08-27 NOTE — Telephone Encounter (Signed)
 Dr. Allena Hamilton has agreed to accept patient.  I spoke with patient and scheduled a new patient visit for her on 11/18/2024 with Dr. Hamilton.

## 2024-09-09 ENCOUNTER — Other Ambulatory Visit: Payer: Self-pay | Admitting: Nurse Practitioner

## 2024-09-09 DIAGNOSIS — N1832 Chronic kidney disease, stage 3b: Secondary | ICD-10-CM

## 2024-09-09 DIAGNOSIS — I1A0 Resistant hypertension: Secondary | ICD-10-CM

## 2024-09-09 DIAGNOSIS — Z9889 Other specified postprocedural states: Secondary | ICD-10-CM

## 2024-09-17 ENCOUNTER — Ambulatory Visit
Admission: RE | Admit: 2024-09-17 | Discharge: 2024-09-17 | Disposition: A | Discharge status: Home / Self Care | Source: Ambulatory Visit | Attending: Nurse Practitioner | Admitting: Nurse Practitioner

## 2024-09-17 DIAGNOSIS — N1832 Chronic kidney disease, stage 3b: Secondary | ICD-10-CM | POA: Diagnosis present

## 2024-09-17 DIAGNOSIS — I1A0 Resistant hypertension: Secondary | ICD-10-CM | POA: Diagnosis present

## 2024-09-17 DIAGNOSIS — Z9889 Other specified postprocedural states: Secondary | ICD-10-CM | POA: Insufficient documentation

## 2024-10-06 ENCOUNTER — Other Ambulatory Visit: Payer: Self-pay | Admitting: Family Medicine

## 2024-10-06 DIAGNOSIS — I10 Essential (primary) hypertension: Secondary | ICD-10-CM

## 2024-10-07 NOTE — Telephone Encounter (Signed)
" °  The original prescription was discontinued on 08/21/2023 by Sharma Coyer, MD for the following reason: Discontinued by provider   Requested Prescriptions  Pending Prescriptions Disp Refills   hydrochlorothiazide  (HYDRODIURIL ) 12.5 MG tablet [Pharmacy Med Name: hydroCHLOROthiazide  12.5 MG Oral Tablet] 90 tablet 3    Sig: TAKE 1 TABLET BY MOUTH DAILY     Cardiovascular: Diuretics - Thiazide Failed - 10/07/2024 11:57 AM      Failed - Cr in normal range and within 180 days    Creat  Date Value Ref Range Status  07/02/2017 1.38 (H) 0.60 - 0.88 mg/dL Final    Comment:    For patients >57 years of age, the reference limit for Creatinine is approximately 13% higher for people identified as African-American. .    Creatinine, Ser  Date Value Ref Range Status  04/25/2024 1.40 (H) 0.57 - 1.00 mg/dL Final         Failed - Last BP in normal range    BP Readings from Last 1 Encounters:  08/12/24 (!) 146/60         Passed - K in normal range and within 180 days    Potassium  Date Value Ref Range Status  04/25/2024 3.8 3.5 - 5.2 mmol/L Final  12/24/2013 3.4 (L) 3.5 - 5.1 mmol/L Final         Passed - Na in normal range and within 180 days    Sodium  Date Value Ref Range Status  04/25/2024 136 134 - 144 mmol/L Final  12/20/2013 134 (L) 136 - 145 mmol/L Final         Passed - Valid encounter within last 6 months    Recent Outpatient Visits           2 months ago Hypertensive kidney disease with stage 3b chronic kidney disease [facility])   [place] [facility] [place], Lauraine SAILOR, DO   4 months ago Hypertensive kidney disease with stage 3b chronic kidney disease [facility])   [place] [facility] [place], Lauraine SAILOR, DO   5 months ago Ear pain, bilateral   [facility] [place], Lauraine SAILOR, DO   5 months ago Primary hypertension   [place] [facility] [place], [place], PA-C   8 months ago Primary hypertension    [place] [facility] [place], Coyer, MD                "

## 2024-10-10 ENCOUNTER — Ambulatory Visit: Admitting: Family Medicine

## 2024-10-10 DIAGNOSIS — N1831 Chronic kidney disease, stage 3a: Secondary | ICD-10-CM

## 2024-10-10 DIAGNOSIS — G629 Polyneuropathy, unspecified: Secondary | ICD-10-CM

## 2024-10-10 DIAGNOSIS — I10 Essential (primary) hypertension: Secondary | ICD-10-CM

## 2024-10-10 DIAGNOSIS — K219 Gastro-esophageal reflux disease without esophagitis: Secondary | ICD-10-CM

## 2024-10-10 DIAGNOSIS — N2581 Secondary hyperparathyroidism of renal origin: Secondary | ICD-10-CM

## 2024-10-10 DIAGNOSIS — E7800 Pure hypercholesterolemia, unspecified: Secondary | ICD-10-CM

## 2024-11-18 ENCOUNTER — Ambulatory Visit: Admitting: Internal Medicine

## 2024-12-09 ENCOUNTER — Ambulatory Visit: Admitting: Pulmonary Disease

## 2025-01-14 ENCOUNTER — Ambulatory Visit
